# Patient Record
Sex: Female | Born: 1935 | Race: White | Hispanic: No | Marital: Married | Smoking: Former smoker
Health system: Southern US, Community
[De-identification: ages and names within clinical notes are randomized; demographics above are authoritative.]

## PROBLEM LIST (undated history)

## (undated) DIAGNOSIS — C4490 Unspecified malignant neoplasm of skin, unspecified: Secondary | ICD-10-CM

## (undated) DIAGNOSIS — F32A Depression, unspecified: Secondary | ICD-10-CM

## (undated) DIAGNOSIS — E213 Hyperparathyroidism, unspecified: Secondary | ICD-10-CM

## (undated) DIAGNOSIS — M1990 Unspecified osteoarthritis, unspecified site: Secondary | ICD-10-CM

## (undated) DIAGNOSIS — M8580 Other specified disorders of bone density and structure, unspecified site: Secondary | ICD-10-CM

## (undated) DIAGNOSIS — E785 Hyperlipidemia, unspecified: Secondary | ICD-10-CM

## (undated) DIAGNOSIS — J449 Chronic obstructive pulmonary disease, unspecified: Secondary | ICD-10-CM

## (undated) DIAGNOSIS — E559 Vitamin D deficiency, unspecified: Secondary | ICD-10-CM

## (undated) DIAGNOSIS — R7301 Impaired fasting glucose: Secondary | ICD-10-CM

## (undated) DIAGNOSIS — F329 Major depressive disorder, single episode, unspecified: Secondary | ICD-10-CM

## (undated) DIAGNOSIS — K8020 Calculus of gallbladder without cholecystitis without obstruction: Secondary | ICD-10-CM

## (undated) DIAGNOSIS — K219 Gastro-esophageal reflux disease without esophagitis: Secondary | ICD-10-CM

## (undated) DIAGNOSIS — D179 Benign lipomatous neoplasm, unspecified: Secondary | ICD-10-CM

## (undated) DIAGNOSIS — K5790 Diverticulosis of intestine, part unspecified, without perforation or abscess without bleeding: Secondary | ICD-10-CM

## (undated) DIAGNOSIS — R1900 Intra-abdominal and pelvic swelling, mass and lump, unspecified site: Secondary | ICD-10-CM

## (undated) DIAGNOSIS — E039 Hypothyroidism, unspecified: Secondary | ICD-10-CM

## (undated) DIAGNOSIS — F419 Anxiety disorder, unspecified: Secondary | ICD-10-CM

## (undated) DIAGNOSIS — K5792 Diverticulitis of intestine, part unspecified, without perforation or abscess without bleeding: Secondary | ICD-10-CM

## (undated) HISTORY — DX: Major depressive disorder, single episode, unspecified: F32.9

## (undated) HISTORY — DX: Other specified disorders of bone density and structure, unspecified site: M85.80

## (undated) HISTORY — PX: OTHER SURGICAL HISTORY: SHX169

## (undated) HISTORY — DX: Hypothyroidism, unspecified: E03.9

## (undated) HISTORY — DX: Depression, unspecified: F32.A

## (undated) HISTORY — DX: Unspecified malignant neoplasm of skin, unspecified: C44.90

## (undated) HISTORY — DX: Hyperparathyroidism, unspecified: E21.3

## (undated) HISTORY — DX: Diverticulosis of intestine, part unspecified, without perforation or abscess without bleeding: K57.90

## (undated) HISTORY — DX: Impaired fasting glucose: R73.01

## (undated) HISTORY — DX: Calculus of gallbladder without cholecystitis without obstruction: K80.20

## (undated) HISTORY — DX: Unspecified osteoarthritis, unspecified site: M19.90

## (undated) HISTORY — DX: Vitamin D deficiency, unspecified: E55.9

## (undated) HISTORY — DX: Intra-abdominal and pelvic swelling, mass and lump, unspecified site: R19.00

## (undated) HISTORY — DX: Hyperlipidemia, unspecified: E78.5

## (undated) HISTORY — DX: Benign lipomatous neoplasm, unspecified: D17.9

## (undated) HISTORY — PX: COLON RESECTION: SHX5231

## (undated) HISTORY — PX: TONSILLECTOMY AND ADENOIDECTOMY: SUR1326

## (undated) HISTORY — DX: Diverticulitis of intestine, part unspecified, without perforation or abscess without bleeding: K57.92

## (undated) HISTORY — DX: Gastro-esophageal reflux disease without esophagitis: K21.9

---

## 1998-09-24 ENCOUNTER — Other Ambulatory Visit: Admission: RE | Admit: 1998-09-24 | Discharge: 1998-09-24 | Payer: Self-pay | Admitting: Obstetrics and Gynecology

## 1998-11-14 ENCOUNTER — Encounter: Payer: Self-pay | Admitting: General Surgery

## 1998-11-14 ENCOUNTER — Ambulatory Visit (HOSPITAL_COMMUNITY): Admission: RE | Admit: 1998-11-14 | Discharge: 1998-11-14 | Payer: Self-pay | Admitting: General Surgery

## 1999-10-27 ENCOUNTER — Other Ambulatory Visit: Admission: RE | Admit: 1999-10-27 | Discharge: 1999-10-27 | Payer: Self-pay | Admitting: Obstetrics and Gynecology

## 2000-11-08 ENCOUNTER — Other Ambulatory Visit: Admission: RE | Admit: 2000-11-08 | Discharge: 2000-11-08 | Payer: Self-pay | Admitting: Obstetrics and Gynecology

## 2001-09-11 ENCOUNTER — Encounter: Payer: Self-pay | Admitting: Orthopedic Surgery

## 2001-09-18 ENCOUNTER — Inpatient Hospital Stay (HOSPITAL_COMMUNITY): Admission: RE | Admit: 2001-09-18 | Discharge: 2001-09-21 | Payer: Self-pay | Admitting: Orthopedic Surgery

## 2001-09-18 ENCOUNTER — Encounter: Payer: Self-pay | Admitting: Orthopedic Surgery

## 2001-11-09 ENCOUNTER — Other Ambulatory Visit: Admission: RE | Admit: 2001-11-09 | Discharge: 2001-11-09 | Payer: Self-pay | Admitting: Obstetrics and Gynecology

## 2002-08-02 HISTORY — PX: TOTAL HIP ARTHROPLASTY: SHX124

## 2004-01-30 ENCOUNTER — Ambulatory Visit (HOSPITAL_COMMUNITY): Admission: RE | Admit: 2004-01-30 | Discharge: 2004-01-30 | Payer: Self-pay | Admitting: Internal Medicine

## 2004-03-20 ENCOUNTER — Emergency Department (HOSPITAL_COMMUNITY): Admission: EM | Admit: 2004-03-20 | Discharge: 2004-03-20 | Payer: Self-pay | Admitting: Emergency Medicine

## 2004-04-22 ENCOUNTER — Ambulatory Visit (HOSPITAL_COMMUNITY): Admission: RE | Admit: 2004-04-22 | Discharge: 2004-04-22 | Payer: Self-pay | Admitting: Internal Medicine

## 2004-06-02 ENCOUNTER — Encounter (INDEPENDENT_AMBULATORY_CARE_PROVIDER_SITE_OTHER): Payer: Self-pay | Admitting: *Deleted

## 2004-06-02 ENCOUNTER — Inpatient Hospital Stay (HOSPITAL_COMMUNITY): Admission: RE | Admit: 2004-06-02 | Discharge: 2004-06-06 | Payer: Self-pay | Admitting: Surgery

## 2007-12-28 ENCOUNTER — Encounter: Admission: RE | Admit: 2007-12-28 | Discharge: 2007-12-28 | Payer: Self-pay | Admitting: Surgery

## 2008-08-02 HISTORY — PX: COLON RESECTION: SHX5231

## 2009-01-30 ENCOUNTER — Ambulatory Visit (HOSPITAL_COMMUNITY): Admission: RE | Admit: 2009-01-30 | Discharge: 2009-01-30 | Payer: Self-pay | Admitting: Surgery

## 2010-08-23 ENCOUNTER — Encounter: Payer: Self-pay | Admitting: Surgery

## 2010-08-23 ENCOUNTER — Encounter: Payer: Self-pay | Admitting: Internal Medicine

## 2010-12-18 NOTE — H&P (Signed)
[facility]  Patient:    Janet Mccarthy, Janet Mccarthy Visit Number: [id number] MRN:           Service Type: Dictated by:   Dorie Rank, P.A. Adm. Date:  09/18/01                         History and Physical  ATTENDING NOT GIVEN  DATE OF BIRTH:  11-30-1934  CHIEF COMPLAINT:  Right hip pain.  HISTORY OF PRESENT ILLNESS:  The patient is a pleasant 76 year old female who has had a long history of right hip pain for several years now and has been refractory to conservative treatment, to the point where it is interfering with her ADLs.  She had radiographs in the office, which demonstrated significant bone-on-bone degenerative changes with a large inferior osteophyte on the right side.  She also had some significant degeneration in the femoral head.  It was thought that she would benefit from undergoing a right total hip arthroplasty.  Risks and benefits, as well as the procedure, were discussed with the patient.  She agreed to proceed.  MEDICATIONS: 1. Amitriptyline 50 mg 1 p.o. q.h.s. 2. Methyclothiazide 5 mg 1 p.o. q.o.d.  ALLERGIES:  SULFA causes nausea.  PAST MEDICAL HISTORY:  She has had lipomas for about 40 years.  She has situational anxiety.  It is of note on her preoperative evaluation by Dr. Waynard Edwards she did have some EKG changes.  SOCIAL HISTORY:  The patient plans for home health PT.  She lives in a one-level home.  She has two steps up into the house.  She denies any tobacco or alcohol use.  She would like a private room.  FAMILY HISTORY: Noncontributory.  The patient is adopted.  REVIEW OF SYSTEMS:  GENERAL:  No fevers, chills, night sweats, or bleeding tendencies.  CARDIOVASCULAR:  Occasional dyspnea on exertion.  No chest pain, angina, or orthopnea.  PULMONARY:  No shortness of breath, productive cough, or hemoptysis.  GI:  No nausea, vomiting, diarrhea, constipation, melena, or bloody stools.  NEUROLOGIC:  No seizures, headaches, or  paralysis.  GU:  No hematuria, dysuria, or discharge.  PHYSICAL EXAMINATION:  GENERAL:  Alert and oriented 76 year old female, well-developed, well-nourished.  HEENT:  Head is atraumatic, normocephalic.  Oropharynx is clear.  NECK:  Supple.  Negative for carotid bruits bilaterally.  She has some soft, palpable lipomas to the right sternocleidomastoid area and posterior neck. These are soft, mobile, and nontender.  CHEST:  Lungs are clear to auscultation bilaterally.  No wheezes, rhonchi, or rales.  BREASTS:  Not pertinent to present illness.  ADENOPATHY:  No cervical lymphadenopathy palpated on exam.  HEART:  S1, S2.  Negative for murmur, rub, or gallop.  Heart is regular in rate and rhythm.  ABDOMEN:  Soft, nontender.  Positive bowel sounds.  GENITOURINARY:  Not pertinent to present illness.  EXTREMITIES:  2+ dorsalis pedispulse bilaterally.  1+ posterior tibialis pulse bilaterally.  She has flexion to the right hip to about 75 degrees.  No internal rotation.  External rotation to about 10 degrees, 20 degrees of abduction.  She has pain with movement.  She walks with an antalgic gait.  SKIN:  Intact.  Nolesions or rashes appreciated on exam.  LABORATORY DATA:  Preop labs and x-rays are pending at this time.  IMPRESSION: 1. Osteoarthritis to the right hip. 2. EKG changes.  The patient underwent a Cardiolite and her cardiac clearance    is currently  pending.  PLAN:  A right total hip arthroplasty pending cardiac clearance by Dr. Donnie Aho and Dr. Waynard Edwards. Dictated by:   Dorie Rank, P.A. DD:  09/12/01 TD:  09/12/01 Job: [id number]

## 2010-12-18 NOTE — Discharge Summary (Signed)
Janet Mccarthy, Janet Mccarthy           ACCOUNT NO.:  [id number]   MEDICAL RECORD NO.:  [id number]          PATIENT TYPE:  INP   LOCATION:  0448                         FACILITY:  [facility]   PHYSICIAN:  Velora Heckler, MD      DATE OF BIRTH:  [birth year]   DATE OF ADMISSION:  06/02/2004  DATE OF DISCHARGE:  06/06/2004                                 DISCHARGE SUMMARY   REASON FOR ADMISSION:  Retroperitoneal mass, sigmoid diverticular disease.   HISTORY OF PRESENT ILLNESS:  The patient is a pleasant 76 year old white  female with a complex recent medical history. She was diagnosed with  diverticular disease of the colon in April of 2005. CT scanning revealed  sigmoid diverticular disease. I have also demonstrated a retroperitoneal  mass at the level of the aortic bifurcation. This measured 5.6 cm in  greatest dimension. The patient was seen in consultation by Dr. Hillary Bow at [facility]. She returned to [place].  She underwent colonoscopy by Dr. Yancey Flemings. She was then prepared and is  now brought to the operating room for abdominal exploration for sigmoid  colectomy for treatment of diverticular disease and resection of  retroperitoneal mass.   HOSPITAL COURSE:  The patient was admitted June 02, 2004. She underwent  exploratory laparotomy. She underwent sigmoid colectomy with resection of  retroperitoneal mass. Final pathology revealed diverticulosis with  diverticulitis and underlying abscess formation. There was no sign of  malignancy. Retroperitoneal mass was felt to represent a cellular  Schwannoma. Case was sent for outside consultation to Dr. Marcene Brawn at [facility] in [place]. They felt that nothing  present within the tumor was worrisome for malignancy, and local recurrence  was felt to be unlikely.   Postoperatively, the patient did well. She had gradual resolution of her  ileus. She remained hemodynamically stable.  She began sips of clear liquids  on the second postoperative day. She advanced to a clear liquid diet on the  third postoperative day. The patient tolerated a regular diet and was  discharged on the fourth postoperative day.   DISCHARGE PLAN:  The patient was discharged on June 06, 2004 in good  condition, tolerating a regular diet, and ambulating independently. The  patient will take Vicodin as needed for pain as well as her other  medications as per usual. She will be seen back in my office at [facility] in 3 weeks.   FINAL DIAGNOSES:  1.  Sigmoid diverticular disease, diverticulitis, contained abscess      formation.  2.  Cellular Schwannoma, 5 cm.   CONDITION ON DISCHARGE:  Good.     Todd   TMG/MEDQ  D:  06/16/2004  T:  06/16/2004  Job:  [id number]   cc:   Loraine Leriche A. Waynard Edwards, M.D.  [address]  [place]  [place] [address]  Fax: [phone]   Wilhemina Bonito. Marina Goodell, M.D. [facility]   Hillary Bow, M.D.

## 2010-12-18 NOTE — Op Note (Signed)
[name], Mccarthy           ACCOUNT NO.:  [id number]   MEDICAL RECORD NO.:  [id number]          PATIENT TYPE:  INP   LOCATION:  0004                         FACILITY:  [facility]   PHYSICIAN:  Velora Heckler, MD      DATE OF BIRTH:  July 21, 1935   DATE OF PROCEDURE:  06/02/2004  DATE OF DISCHARGE:                                 OPERATIVE REPORT   PREOPERATIVE DIAGNOSES:  1.  Sigmoid diverticular disease.  2.  Retroperitoneal mass (5 x 3 x 3 cm).   POSTOPERATIVE DIAGNOSES:  1.  Sigmoid diverticular disease.  2.  Retroperitoneal mass (5 x 3 x 3 cm).   PROCEDURES:  1.  Sigmoid colectomy.  2.  Resection retroperitoneal mass.   SURGEON:  Velora Heckler, M.D.   ASSISTANT:  Jerelene Redden, M.D.   ANESTHESIA:  General per Dr. Frutoso Chase.   ESTIMATED BLOOD LOSS:  150 mL.   PREPARATION:  Betadine.   COMPLICATIONS:  None.   INDICATIONS:  The patient is a 76 year old white female with a history of  diverticular disease dating until April 2005.  The patient was treated with  antibiotics.  She underwent CT scanning of the abdomen.  This demonstrated a  retroperitoneal mass measuring approximately 5 x 3 x 3 cm at the level of  the aortic bifurcation.  This was confirmed on MRI scan.  Work-up failed to  differentiate the type of tumor.  She was seen in consultation by Dr. Hillary Bow at [facility] in August 2005.  Dr.  Beverely Pace felt this was likely a benign process.  However, percutaneous  biopsy carried considerable risk.  At that time, they recommended  observation.  However, the patient continued to have problems with sigmoid  diverticulitis.  She was treated again with antibiotics.  She subsequently  underwent attempted colonoscopy by Dr. Yancey Flemings.  He was unable to pass  through the sigmoid colon.  Therefore, she underwent barium enema on  September 21.  This showed extravasation in the mid sigmoid colon in the  region of significant  diverticulosis.  There were at least two adjacent  filling defect representing polyps.  The patient returned to my practice for  consideration for sigmoid colectomy and concurrent either biopsy or  resection of the retroperitoneal mass.   DESCRIPTION OF PROCEDURE:  The procedure is done in OR #6 at the [facility].  The patient is brought to the operating room and placed  in a supine position on the operating room table.  Following the  administration of general anesthesia, the patient is prepped and draped in  the usual strict aseptic fashion.  After ascertaining that an adequate level  of anesthesia had been obtained, a midline abdominal incision is made with a  #10 blade.  Dissection is carried down through subcutaneous tissues.  Fascia  is incised in the midline, and the peritoneal cavity is entered cautiously.  Abdomen is explored.  There is significant diverticular disease extending  from the distal descending colon, throughout the sigmoid colon, and into the  pelvis.  The sigmoid colon is  densely adherent to the left adnexal  structures.  It is also adherent to the posterior wall of the uterus.  There  is dense inflammatory change present.  There is no acute abscess or purulent  drainage, however.  In the retroperitoneum, there is indeed a 5 cm mass at  the bifurcation of the aorta.  There is no other associated lymphadenopathy  palpable.  Omentum is normal without sign of tumor.  Remainder of the colon  is normal.  Appendix appears surgically absent.  Small bowel is run  throughout its length and appears grossly normal.  There is no  lymphadenopathy.  There is no Meckel's diverticulum.  There is no other sign  of small bowel pathology.  Stomach appears normal on palpation.  Liver is  normal without mass lesion.  There are a few small gallstones within the  gallbladder.   Bookwalter retractor is placed to maintain exposure.  Dissection is begun on  the lateral  aspect of the sigmoid colon and along its peritoneal reflection.  Sigmoid colon is completely mobilized down to the left adnexa.  Adnexal  structures are dissected off of the lateral aspect of the sigmoid colon.  Ureter is identified and preserved.  Hemostasis is obtained with 2-0 silk  suture ligatures and large yellow Ligaclips.  Sigmoid colon is completely  freed from the adnexa and then from the posterior wall of the uterus.  Distal sigmoid colon just above the pelvic peritoneal reflection is  transected with a GIA stapler.  Mesentery is divided between [facility] clamps  and ligated with 2-0 silk ties and 2-0 silk suture ligatures.  Point in the  distal descending colon is selected and is transected with the GIA stapler.  The remaining mesentery is taken down between [facility] clamps, divided, and  ligated with 2-0 silk ties and 2-0 silk suture ligatures.  Sigmoid colon is  completely excised and submitted to pathology.  Bowel was opened by the  pathologist and shows signs of diverticular disease.  No tumor or mass is  identified.  Good hemostasis is noted along the mesenteric resection line.   Next we turned our attention to the retroperitoneal mass.  Mass palpates  approximately 5 x 3 x 3 cm.  It is located at the bifurcation of the aorta.  Retroperitoneum is opened with the electrocautery.  Dissection is carried  down to the mass.  Mass has a grayish color and appears to be encapsulated.  Tissue is carefully dissected with the right-angle clamp around the mass.  Vascular and lymphatic tributaries are divided between large Ligaclips.  Using gently blunt dissection, the entire mass is mobilized from the  retroperitoneum and excised.  It is submitted to pathology for review.  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  There is  some atypia.  Final diagnosis will pin permanent sectioning.  Good  hemostasis is obtained in the retroperitoneum.  Surgicel was placed in  the bed of the tumor.  Retroperitoneum is closed with a running 2-0 Vicryl  suture.   Next the anastomosis is created between the distal descending colon and  distal sigmoid colon.  The descending colon is mobilized along its lateral  peritoneal attachments to provide for adequate length to reach into the  pelvis.  Staple lines from the proximal and distal end of the bowel are  excised with electrocautery, and 2-0 silk stay sutures are placed.  An end-  to-end anastomosis is then performed with interrupted 3-0 silk sutures  placed  circumferentially.  Stay sutures are removed.  All silk sutures are  tied securely.  Lumen is widely patent.  There does not appear to be any  tension on the anastomosis.  Good hemostasis is noted.  The abdomen is  copiously irrigated with warm saline.  All packs are removed.  Omentum is  used to cover the small bowel.  Midline wound is then closed with a running  #1  Novofil suture in the fascia.  Subcutaneous tissue is irrigated and  hemostasis obtained with the electrocautery.  Skin is closed with stainless  steel staples.  Sterile dressings are applied.  The patient is awakened from  anesthesia and brought to the recovery room in stable condition.  The  patient tolerated the procedure well.     Todd   TMG/MEDQ  D:  06/02/2004  T:  06/02/2004  Job:  [id number]   cc:   Loraine Leriche A. Waynard Edwards, M.D.  [address]  [place]  [place] [address]  Fax: [phone]   Wilhemina Bonito. Marina Goodell, M.D. [facility]   Hillary Bow, MD  [facility]  Dept. of Surgery

## 2010-12-18 NOTE — Discharge Summary (Signed)
[facility]  Patient:    Janet Mccarthy, Janet Mccarthy Visit Number: [id number] MRN: [id number]          Service Type: SUR Location: 4W 0464 01 Attending Physician:  Loanne Drilling Dictated by:   Ralene Bathe, P.A. Admit Date:  09/18/2001 Disc. Date: 09/21/01                             Discharge Summary  ADMISSION DIAGNOSES: 1. End-stage osteoarthritis, right hip. 2. Situational anxiety. 3. Mild preoperative electrocardiogram changes with a negative Cardiolite.  DISCHARGE DIAGNOSES: 1. End-stage osteoarthritis, right hip. 2. Situational anxiety. 3. Mild preoperative electrocardiogram changes with a negative Cardiolite. 4. Status post right hip arthroplasty. 5. Mild postoperative anemia. 6. Mild hyponatremia, resolved with fluid restriction.  PROCEDURE:  Right total hip arthroplasty, surgeon Dr. Ollen Gross, assistant Avel Peace, P.A.-C, under general anesthesia.  HISTORY OF PRESENT ILLNESS:  Ms. Janet Mccarthy is a 76 year old female who had end-stage osteoarthritis of the right hip.  She has failed conservative measures and had significant pain secondary to ADLs.  At this time, she wishes to proceed with right total hip arthroplasty.  The risks and benefits were discussed with the patient at length and she is in agreement and wishes to proceed.  HOSPITAL COURSE:  The patient was admitted and underwent the above-mentioned procedure and tolerated this well.  All appropriate IV antibiotics and analgesics were utilized.  Postoperatively, she was placed on Coumadin for DVT and PE prophylaxis.  She was placed on touchdown weightbearing and total hip precautions with total hip protocol.  She did extremely well postoperatively with therapy.  She did experience a mild hyponatremia that resolved with fluid restrictions and discontinuation of her IV.  She had a mild hemorrhagic anemia not requiring transfusion and she was asymptomatic.  All in all, by postop  day #3, she had met all therapy goals.  She had done stairs.  She was voiding without difficulty.  She was afebrile with vital signs stable.  Her hemoglobin was stable at 10.3.  Her incision was clean and dry and she was neurovascularly intact.  At this time, she was stable for discharge to home in the care of her husband.  LABORATORY DATA AND X-RAY FINDINGS:  Admission hemoglobin 13.8, postop hemoglobin 10.9 and 10.5.  PT and PTT, followed by pharmacy, were normal on admission.  She was placed on Coumadin for DVT and PE prophylaxis.  See chart for details.  Chemistries on admission showed a sodium of 141, postoperatively hyponatremic on February 18, at 131 with improvement to 136 on February 19.  Radiology showed status post right total hip arthroplasty with prosthesis in good position.  No evidence of fracture.  Chest x-ray preoperatively showed chronic obstructive pulmonary disease with no active findings.  EKG was normal sinus rhythm except for some flattening of T waves inferiorly.  She did have a stress adenosine Cardiolite that showed normal Cardiolite scan preoperatively.  CONDITION ON DISCHARGE:  Stable and improved.  DISPOSITION:  The patient is being discharged to home in the care of her husband.  FOLLOWUP:  She is to follow up two weeks postoperatively, call for time.  DISCHARGE MEDICATIONS: 1. Vicodin #30, one to two every four to six hours p.r.n. pain, one refill. 2. Robaxin 500 mg one every eight hours p.r.n. spasm. 3. Coumadin, per pharmacy for a total of three weeks.  ACTIVITY:  Total hip precautions, touchdown weightbearing with therapy.  Home health PT  and R.N. are arranged.  DIET:  Resume home diet. Dictated by:   Ralene Bathe, P.A. Attending Physician:  Loanne Drilling DD:  09/21/01 TD:  09/21/01 Job: [id number]

## 2010-12-18 NOTE — Op Note (Signed)
[facility]  Patient:    MCCARTNEY, BRUCKS Visit Number: [id number] MRN: [id number]          Service Type: Attending:  Ollen Gross, M.D. Proc. Date: 09/18/01                             Operative Report  PREOPERATIVE DIAGNOSIS:  Osteoarthritis right hip.  POSTOPERATIVE DIAGNOSIS:  Osteoarthritis right hip.  PROCEDURE PERFORMED:  Right total hip arthroplasty.  SURGEON:  Ollen Gross, M.D.  ASSISTANT:  Alexzandrew L. Perkins, P.A.-C.  ANESTHESIA:  General.  ESTIMATED BLOOD LOSS:  400 cc.  DRAIN:  Hemovac x1.  COMPLICATIONS:  None.  CONDITION:  Stable to recovery.  INDICATION:  Mrs. Poma is a 76 year old female with severe osteoarthritis of her right hip with pain refractory to nonoperative management.  She presents now for a right total hip arthroplasty.  DESCRIPTION OF PROCEDURE:  After the successful administration of a general anesthetic, the patient was placed in the left lateral decubitus position with her right side up and held with the hip positioner.  Her right lower extremity was isolated from her peritoneal plastic drapes, prepped, and draped in the usual sterile fashion.  A standard posterolateral incision was made with the 10 blade through the subcutaneous tissues to the level of the fascia lata which was incised in line with the skin incision.  The sciatic nerve was palpated and protected, and the short external rotators isolated off the femur.  A capsulectomy is then performed.  The hip is dislocated and the center of the femoral head marked.  Trial prosthesis is placed at the center of the trial head and corresponds to the center of the patients native femoral head.  An osteotomy is then made with an oscillating saw.  The femur was subsequently retracted anteriorly and the anterior capsule removed. Acetabular exposure was obtained.  Acetabular reaming begins with a 47, increments to a 53, and then a 54 mm pinnicle  acetabular shell was impacted in an anatomic position.  It is transfixed with two dome screws with excellent purchase.  A trial 28 mm neutral liner is then placed.  The femur is then prepared.  The canal is found with the canal finder, and then the canal was irrigated.  It was then broached with the endurance broaches up to a size 3.  The high offset neck is placed with a 28 plus 1.5 head.  The hip is reduced with great stability.  Full extension and full external rotation, 70 degrees flexion and 40 degrees abduction, 90 degrees internal rotation and 90 degrees flexion, and 90 degrees internal rotation. The hip is then dislocated.  All trials are removed.  The permanent apex hold illuminator is placed into the acetabular shell and a permanent 28 mm neutral marathon liner placed into the shell.  A sponge is placed into the acetabulum to prevent cement from coming in.  The cement restrictor is sized, and a size 4 is most appropriate.  A size 4 restrictor is placed at the appropriate depth in the femoral canal.  The canal was then irrigated with pulsatile lavage and cement mixed.  Once ready for implantation, the cement is injected and pressurized into the canal and the endurance luster high offset stem size 3 is placed matching the patient native anteversion.  Once the cement is fully hardened, then the sponge is removed from the acetabulum.  The permanent 28 plus 1.5 head placed onto the  neck and then the hip reduced.  She again had the same stability parameters.  The wound was copiously irrigated with antibiotic solution and short external rotators reattached to the femur through drill holes.  The fascia lata is closed over a Hemovac drain with interrupted #1 Vicryl.  The subcu closed with #1 and 2-0 Vicryl and a subcuticular running 4-0 Monocryl.  The incision is cleaned and dried. Steri-Strips and a bulky sterile dressing applied.  The patient is awakened and transported to recovery in  stable condition. Attending:  Ollen Gross, M.D. DD:  09/18/01 TD:  09/18/01 Job: [id number]

## 2011-08-06 ENCOUNTER — Encounter (INDEPENDENT_AMBULATORY_CARE_PROVIDER_SITE_OTHER): Payer: Self-pay

## 2011-09-17 DIAGNOSIS — Z1231 Encounter for screening mammogram for malignant neoplasm of breast: Secondary | ICD-10-CM | POA: Diagnosis not present

## 2011-09-17 DIAGNOSIS — N899 Noninflammatory disorder of vagina, unspecified: Secondary | ICD-10-CM | POA: Diagnosis not present

## 2011-09-17 DIAGNOSIS — Z124 Encounter for screening for malignant neoplasm of cervix: Secondary | ICD-10-CM | POA: Diagnosis not present

## 2011-09-17 DIAGNOSIS — K644 Residual hemorrhoidal skin tags: Secondary | ICD-10-CM | POA: Diagnosis not present

## 2011-09-17 DIAGNOSIS — Z01419 Encounter for gynecological examination (general) (routine) without abnormal findings: Secondary | ICD-10-CM | POA: Diagnosis not present

## 2011-11-15 DIAGNOSIS — H259 Unspecified age-related cataract: Secondary | ICD-10-CM | POA: Diagnosis not present

## 2011-11-15 DIAGNOSIS — H04129 Dry eye syndrome of unspecified lacrimal gland: Secondary | ICD-10-CM | POA: Diagnosis not present

## 2011-11-15 DIAGNOSIS — H5200 Hypermetropia, unspecified eye: Secondary | ICD-10-CM | POA: Diagnosis not present

## 2011-11-15 DIAGNOSIS — H43819 Vitreous degeneration, unspecified eye: Secondary | ICD-10-CM | POA: Diagnosis not present

## 2012-01-06 DIAGNOSIS — E785 Hyperlipidemia, unspecified: Secondary | ICD-10-CM | POA: Diagnosis not present

## 2012-01-06 DIAGNOSIS — E559 Vitamin D deficiency, unspecified: Secondary | ICD-10-CM | POA: Diagnosis not present

## 2012-01-06 DIAGNOSIS — R82998 Other abnormal findings in urine: Secondary | ICD-10-CM | POA: Diagnosis not present

## 2012-01-06 DIAGNOSIS — M899 Disorder of bone, unspecified: Secondary | ICD-10-CM | POA: Diagnosis not present

## 2012-01-06 DIAGNOSIS — E039 Hypothyroidism, unspecified: Secondary | ICD-10-CM | POA: Diagnosis not present

## 2012-01-06 DIAGNOSIS — I10 Essential (primary) hypertension: Secondary | ICD-10-CM | POA: Diagnosis not present

## 2012-01-06 DIAGNOSIS — R7301 Impaired fasting glucose: Secondary | ICD-10-CM | POA: Diagnosis not present

## 2012-01-07 DIAGNOSIS — Z0000 Encounter for general adult medical examination without abnormal findings: Secondary | ICD-10-CM | POA: Diagnosis not present

## 2012-01-07 DIAGNOSIS — I10 Essential (primary) hypertension: Secondary | ICD-10-CM | POA: Diagnosis not present

## 2012-01-07 DIAGNOSIS — E785 Hyperlipidemia, unspecified: Secondary | ICD-10-CM | POA: Diagnosis not present

## 2012-01-13 DIAGNOSIS — Z0000 Encounter for general adult medical examination without abnormal findings: Secondary | ICD-10-CM | POA: Diagnosis not present

## 2012-01-13 DIAGNOSIS — R7301 Impaired fasting glucose: Secondary | ICD-10-CM | POA: Diagnosis not present

## 2012-01-13 DIAGNOSIS — E785 Hyperlipidemia, unspecified: Secondary | ICD-10-CM | POA: Diagnosis not present

## 2012-01-13 DIAGNOSIS — F39 Unspecified mood [affective] disorder: Secondary | ICD-10-CM | POA: Diagnosis not present

## 2012-01-18 DIAGNOSIS — Z1212 Encounter for screening for malignant neoplasm of rectum: Secondary | ICD-10-CM | POA: Diagnosis not present

## 2012-07-10 DIAGNOSIS — R7301 Impaired fasting glucose: Secondary | ICD-10-CM | POA: Diagnosis not present

## 2012-07-10 DIAGNOSIS — E210 Primary hyperparathyroidism: Secondary | ICD-10-CM | POA: Diagnosis not present

## 2012-07-10 DIAGNOSIS — E039 Hypothyroidism, unspecified: Secondary | ICD-10-CM | POA: Diagnosis not present

## 2012-07-10 DIAGNOSIS — Z79899 Other long term (current) drug therapy: Secondary | ICD-10-CM | POA: Diagnosis not present

## 2012-07-10 DIAGNOSIS — I10 Essential (primary) hypertension: Secondary | ICD-10-CM | POA: Diagnosis not present

## 2012-07-10 DIAGNOSIS — E559 Vitamin D deficiency, unspecified: Secondary | ICD-10-CM | POA: Diagnosis not present

## 2012-07-31 DIAGNOSIS — E210 Primary hyperparathyroidism: Secondary | ICD-10-CM | POA: Diagnosis not present

## 2012-07-31 DIAGNOSIS — I10 Essential (primary) hypertension: Secondary | ICD-10-CM | POA: Diagnosis not present

## 2012-07-31 DIAGNOSIS — R748 Abnormal levels of other serum enzymes: Secondary | ICD-10-CM | POA: Diagnosis not present

## 2012-07-31 DIAGNOSIS — Z79899 Other long term (current) drug therapy: Secondary | ICD-10-CM | POA: Diagnosis not present

## 2012-07-31 DIAGNOSIS — H8110 Benign paroxysmal vertigo, unspecified ear: Secondary | ICD-10-CM | POA: Diagnosis not present

## 2012-08-03 DIAGNOSIS — R748 Abnormal levels of other serum enzymes: Secondary | ICD-10-CM | POA: Diagnosis not present

## 2012-08-22 DIAGNOSIS — K7689 Other specified diseases of liver: Secondary | ICD-10-CM | POA: Diagnosis not present

## 2012-08-22 DIAGNOSIS — R945 Abnormal results of liver function studies: Secondary | ICD-10-CM | POA: Diagnosis not present

## 2012-08-22 DIAGNOSIS — R7989 Other specified abnormal findings of blood chemistry: Secondary | ICD-10-CM | POA: Diagnosis not present

## 2012-08-28 ENCOUNTER — Other Ambulatory Visit: Payer: Self-pay | Admitting: Internal Medicine

## 2012-08-28 DIAGNOSIS — R748 Abnormal levels of other serum enzymes: Secondary | ICD-10-CM | POA: Diagnosis not present

## 2012-08-28 DIAGNOSIS — N281 Cyst of kidney, acquired: Secondary | ICD-10-CM

## 2012-08-28 DIAGNOSIS — I10 Essential (primary) hypertension: Secondary | ICD-10-CM | POA: Diagnosis not present

## 2012-08-28 DIAGNOSIS — H8110 Benign paroxysmal vertigo, unspecified ear: Secondary | ICD-10-CM | POA: Diagnosis not present

## 2012-09-04 ENCOUNTER — Encounter: Payer: Self-pay | Admitting: Internal Medicine

## 2012-09-19 DIAGNOSIS — Z01419 Encounter for gynecological examination (general) (routine) without abnormal findings: Secondary | ICD-10-CM | POA: Diagnosis not present

## 2012-09-19 DIAGNOSIS — Z1231 Encounter for screening mammogram for malignant neoplasm of breast: Secondary | ICD-10-CM | POA: Diagnosis not present

## 2012-09-19 DIAGNOSIS — Z124 Encounter for screening for malignant neoplasm of cervix: Secondary | ICD-10-CM | POA: Diagnosis not present

## 2012-09-19 DIAGNOSIS — L940 Localized scleroderma [morphea]: Secondary | ICD-10-CM | POA: Diagnosis not present

## 2012-09-21 DIAGNOSIS — N6489 Other specified disorders of breast: Secondary | ICD-10-CM | POA: Diagnosis not present

## 2012-10-03 ENCOUNTER — Ambulatory Visit: Payer: Self-pay | Admitting: Internal Medicine

## 2012-10-11 ENCOUNTER — Ambulatory Visit
Admission: RE | Admit: 2012-10-11 | Discharge: 2012-10-11 | Disposition: A | Discharge status: Home / Self Care | Payer: Medicare Other | Source: Ambulatory Visit | Attending: Internal Medicine | Admitting: Internal Medicine

## 2012-10-11 DIAGNOSIS — N281 Cyst of kidney, acquired: Secondary | ICD-10-CM

## 2012-10-31 ENCOUNTER — Encounter: Payer: Self-pay | Admitting: Internal Medicine

## 2012-10-31 ENCOUNTER — Other Ambulatory Visit (INDEPENDENT_AMBULATORY_CARE_PROVIDER_SITE_OTHER): Payer: Medicare Other

## 2012-10-31 ENCOUNTER — Ambulatory Visit (INDEPENDENT_AMBULATORY_CARE_PROVIDER_SITE_OTHER): Payer: Medicare Other | Admitting: Internal Medicine

## 2012-10-31 VITALS — BP 154/72 | HR 84 | Ht 67.0 in | Wt 193.0 lb

## 2012-10-31 DIAGNOSIS — R7402 Elevation of levels of lactic acid dehydrogenase (LDH): Secondary | ICD-10-CM

## 2012-10-31 DIAGNOSIS — R933 Abnormal findings on diagnostic imaging of other parts of digestive tract: Secondary | ICD-10-CM

## 2012-10-31 LAB — IGA: IgA: 323 mg/dL (ref 68–378)

## 2012-10-31 NOTE — Patient Instructions (Addendum)
  Your physician has requested that you go to the basement for the following lab work before leaving today:  TTG and IGA   You have been scheduled for an MRI and MRCP at [facility] in radiology on the first floor on 11-02-12 Your appointment time is 8:00am. Please arrive 15 minutes prior to your appointment time for registration purposes.  Do not eat or drink anything after midnight.  If you have any metal in your body, have a pacemaker or defibrillator, please be sure to let your ordering physician know. This test typically takes 45 minutes to 1 hour to complete.  If you need to reschedule, the phone number is [phone]

## 2012-10-31 NOTE — Progress Notes (Signed)
HISTORY OF PRESENT ILLNESS:  Janet Mccarthy is a 78 y.o. female who I have seen remotely for complicated diverticular disease leading to surgery in 2005. She is sent today regarding abnormal liver test. She has multiple medical problems as listed below including hyperparathyroidism and osteoporosis. Patient states that she was not known to have abnormal liver tests until this fall. Review of outside records shows blood work from 07/10/2012 with AST 38, ALT 71 (40), alkaline phosphatase 229 (137 upper limit of normal), and total bilirubin 0.5. Normal thyroid testing and hemoglobin A1c as well as vitamin B12 level. Calcium 10.6 with PTH 68 (65). Followup liver test from December 30 revealed ALT 41 and alkaline phosphatase 153 testing in early January with alkaline phosphatase 149 (117) with 60% liver, 37% bone, 3% intestinal fraction. Most recently ALT 79 and alkaline phosphatase 226 on 08/28/2012. Additional testing including hepatitis serologies, antinuclear antibody, antimitochondrial antibody, ceruloplasmin were normal or equivocal. Patient denies personal history of liver disease. No new medications. She is adopted. Abdominal ultrasound obtained 08/22/2012 by [facility] was said to show possible fatty infiltration of the liver and possible gallstones. The common duct was said to be prominent at 1.1 cm. The patient's GI review of systems is unremarkable. No abdominal pain or weight loss. Actually 5 pound weight gain over the past few years. She is accompanied today by her husband REVIEW OF SYSTEMS:  All non-GI ROS negative except for anxiety and heart murmur  Past Medical History  Diagnosis Date  . Diverticular disease     sigmoid  . Diverticulitis   . Retroperitoneal mass   . Diverticulosis   . Hyperlipidemia   . IFG (impaired fasting glucose)   . Osteopenia   . Hyperparathyroidism   . Osteoarthritis   . Hypothyroidism   . Vitamin D deficiency   . Depression   . Arthritis      right hip  . Multiple lipomas   . Skin cancer     nose  . Gallstones     Past Surgical History  Procedure Laterality Date  . Tonsillectomy and adenoidectomy    . Total hip arthroplasty  2004    right  . Colon resection    . Tumor removal      benign retriperitoneal    Social History Janet Mccarthy  reports that she quit smoking about 41 years ago. She does not have any smokeless tobacco history on file. She reports that she does not drink alcohol or use illicit drugs.  family history is not on file. She is adopted.  Allergies  Allergen Reactions  . Sulfa Antibiotics        PHYSICAL EXAMINATION: Vital signs: BP 154/72  Pulse 84  Ht 5\' 7"  (1.702 m)  Wt 193 lb (87.544 kg)  BMI 30.22 kg/m2  Constitutional: generally well-appearing, no acute distress Psychiatric: alert and oriented x3, cooperative Eyes: extraocular movements intact, anicteric, conjunctiva pink Mouth: oral pharynx moist, no lesions Neck: supple no lymphadenopathy Cardiovascular: heart regular rate and rhythm, no murmur Lungs: clear to auscultation bilaterally Abdomen: soft, obese, nontender, nondistended, no obvious ascites, no peritoneal signs, normal bowel sounds, no organomegaly Extremities: no lower extremity edema bilaterally Skin: no lesions on visible extremities Neuro: No focal deficits. No asterixis.    ASSESSMENT:  #1. Mild elevation of ALT and alkaline phosphatase. Negative preliminary workup. Possibly fatty liver. Celiac disease occasional cause for elevated liver tests of uncertain etiology. Will evaluate. Rule out obstructive process, particularly given ultrasound showing prominence of  the common bile duct.   PLAN:  #1. Tissue transglutaminase antibody. #2. Schedule MRI of the liver and MRCP #3. Further plans to be determined thereafter

## 2012-11-02 ENCOUNTER — Ambulatory Visit (HOSPITAL_COMMUNITY)
Admission: RE | Admit: 2012-11-02 | Discharge: 2012-11-02 | Disposition: A | Discharge status: Home / Self Care | Payer: Medicare Other | Source: Ambulatory Visit | Attending: Internal Medicine | Admitting: Internal Medicine

## 2012-11-02 ENCOUNTER — Other Ambulatory Visit: Payer: Self-pay | Admitting: Internal Medicine

## 2012-11-02 DIAGNOSIS — N281 Cyst of kidney, acquired: Secondary | ICD-10-CM | POA: Diagnosis not present

## 2012-11-02 DIAGNOSIS — R933 Abnormal findings on diagnostic imaging of other parts of digestive tract: Secondary | ICD-10-CM

## 2012-11-02 DIAGNOSIS — K8020 Calculus of gallbladder without cholecystitis without obstruction: Secondary | ICD-10-CM | POA: Diagnosis not present

## 2012-11-02 DIAGNOSIS — K449 Diaphragmatic hernia without obstruction or gangrene: Secondary | ICD-10-CM | POA: Diagnosis not present

## 2012-11-02 DIAGNOSIS — R748 Abnormal levels of other serum enzymes: Secondary | ICD-10-CM | POA: Diagnosis not present

## 2012-11-02 DIAGNOSIS — Q619 Cystic kidney disease, unspecified: Secondary | ICD-10-CM | POA: Diagnosis not present

## 2012-11-02 MED ORDER — GADOBENATE DIMEGLUMINE 529 MG/ML IV SOLN
18.0000 mL | Freq: Once | INTRAVENOUS | Status: AC | PRN
  Administered 2012-11-02: 18 mL via INTRAVENOUS

## 2012-11-27 DIAGNOSIS — H43819 Vitreous degeneration, unspecified eye: Secondary | ICD-10-CM | POA: Diagnosis not present

## 2012-11-27 DIAGNOSIS — H259 Unspecified age-related cataract: Secondary | ICD-10-CM | POA: Diagnosis not present

## 2012-11-27 DIAGNOSIS — H04129 Dry eye syndrome of unspecified lacrimal gland: Secondary | ICD-10-CM | POA: Diagnosis not present

## 2013-03-09 DIAGNOSIS — I10 Essential (primary) hypertension: Secondary | ICD-10-CM | POA: Diagnosis not present

## 2013-03-09 DIAGNOSIS — E039 Hypothyroidism, unspecified: Secondary | ICD-10-CM | POA: Diagnosis not present

## 2013-03-09 DIAGNOSIS — E785 Hyperlipidemia, unspecified: Secondary | ICD-10-CM | POA: Diagnosis not present

## 2013-03-09 DIAGNOSIS — E559 Vitamin D deficiency, unspecified: Secondary | ICD-10-CM | POA: Diagnosis not present

## 2013-03-09 DIAGNOSIS — R82998 Other abnormal findings in urine: Secondary | ICD-10-CM | POA: Diagnosis not present

## 2013-03-09 DIAGNOSIS — R7301 Impaired fasting glucose: Secondary | ICD-10-CM | POA: Diagnosis not present

## 2013-03-19 DIAGNOSIS — Z1331 Encounter for screening for depression: Secondary | ICD-10-CM | POA: Diagnosis not present

## 2013-03-19 DIAGNOSIS — R748 Abnormal levels of other serum enzymes: Secondary | ICD-10-CM | POA: Diagnosis not present

## 2013-03-19 DIAGNOSIS — N281 Cyst of kidney, acquired: Secondary | ICD-10-CM | POA: Diagnosis not present

## 2013-03-19 DIAGNOSIS — I10 Essential (primary) hypertension: Secondary | ICD-10-CM | POA: Diagnosis not present

## 2013-03-19 DIAGNOSIS — K754 Autoimmune hepatitis: Secondary | ICD-10-CM | POA: Diagnosis not present

## 2013-03-19 DIAGNOSIS — Z6830 Body mass index (BMI) 30.0-30.9, adult: Secondary | ICD-10-CM | POA: Diagnosis not present

## 2013-03-19 DIAGNOSIS — Z1212 Encounter for screening for malignant neoplasm of rectum: Secondary | ICD-10-CM | POA: Diagnosis not present

## 2013-03-19 DIAGNOSIS — R609 Edema, unspecified: Secondary | ICD-10-CM | POA: Diagnosis not present

## 2013-03-19 DIAGNOSIS — Z0000 Encounter for general adult medical examination without abnormal findings: Secondary | ICD-10-CM | POA: Diagnosis not present

## 2013-03-19 DIAGNOSIS — E210 Primary hyperparathyroidism: Secondary | ICD-10-CM | POA: Diagnosis not present

## 2013-03-19 DIAGNOSIS — H8110 Benign paroxysmal vertigo, unspecified ear: Secondary | ICD-10-CM | POA: Diagnosis not present

## 2013-03-27 DIAGNOSIS — M899 Disorder of bone, unspecified: Secondary | ICD-10-CM | POA: Diagnosis not present

## 2013-05-07 ENCOUNTER — Ambulatory Visit: Payer: Medicare Other | Admitting: Internal Medicine

## 2013-05-28 DIAGNOSIS — Z23 Encounter for immunization: Secondary | ICD-10-CM | POA: Diagnosis not present

## 2013-08-01 DIAGNOSIS — M1710 Unilateral primary osteoarthritis, unspecified knee: Secondary | ICD-10-CM | POA: Diagnosis not present

## 2013-08-01 DIAGNOSIS — M25569 Pain in unspecified knee: Secondary | ICD-10-CM | POA: Diagnosis not present

## 2013-08-01 DIAGNOSIS — M2240 Chondromalacia patellae, unspecified knee: Secondary | ICD-10-CM | POA: Diagnosis not present

## 2013-09-24 DIAGNOSIS — Z1231 Encounter for screening mammogram for malignant neoplasm of breast: Secondary | ICD-10-CM | POA: Diagnosis not present

## 2013-10-08 DIAGNOSIS — M79609 Pain in unspecified limb: Secondary | ICD-10-CM

## 2013-12-03 DIAGNOSIS — H259 Unspecified age-related cataract: Secondary | ICD-10-CM | POA: Diagnosis not present

## 2013-12-03 DIAGNOSIS — H43819 Vitreous degeneration, unspecified eye: Secondary | ICD-10-CM | POA: Diagnosis not present

## 2013-12-03 DIAGNOSIS — H04129 Dry eye syndrome of unspecified lacrimal gland: Secondary | ICD-10-CM | POA: Diagnosis not present

## 2013-12-03 DIAGNOSIS — H52209 Unspecified astigmatism, unspecified eye: Secondary | ICD-10-CM | POA: Diagnosis not present

## 2013-12-26 DIAGNOSIS — L570 Actinic keratosis: Secondary | ICD-10-CM | POA: Diagnosis not present

## 2013-12-26 DIAGNOSIS — D235 Other benign neoplasm of skin of trunk: Secondary | ICD-10-CM | POA: Diagnosis not present

## 2013-12-26 DIAGNOSIS — C44319 Basal cell carcinoma of skin of other parts of face: Secondary | ICD-10-CM | POA: Diagnosis not present

## 2014-01-23 DIAGNOSIS — L570 Actinic keratosis: Secondary | ICD-10-CM | POA: Diagnosis not present

## 2014-04-02 DIAGNOSIS — E785 Hyperlipidemia, unspecified: Secondary | ICD-10-CM | POA: Diagnosis not present

## 2014-04-02 DIAGNOSIS — I10 Essential (primary) hypertension: Secondary | ICD-10-CM | POA: Diagnosis not present

## 2014-04-02 DIAGNOSIS — R82998 Other abnormal findings in urine: Secondary | ICD-10-CM | POA: Diagnosis not present

## 2014-04-02 DIAGNOSIS — M899 Disorder of bone, unspecified: Secondary | ICD-10-CM | POA: Diagnosis not present

## 2014-04-02 DIAGNOSIS — E039 Hypothyroidism, unspecified: Secondary | ICD-10-CM | POA: Diagnosis not present

## 2014-04-09 DIAGNOSIS — Z0000 Encounter for general adult medical examination without abnormal findings: Secondary | ICD-10-CM | POA: Diagnosis not present

## 2014-04-09 DIAGNOSIS — H8110 Benign paroxysmal vertigo, unspecified ear: Secondary | ICD-10-CM | POA: Diagnosis not present

## 2014-04-09 DIAGNOSIS — Z79899 Other long term (current) drug therapy: Secondary | ICD-10-CM | POA: Diagnosis not present

## 2014-04-09 DIAGNOSIS — Z1212 Encounter for screening for malignant neoplasm of rectum: Secondary | ICD-10-CM | POA: Diagnosis not present

## 2014-04-09 DIAGNOSIS — R609 Edema, unspecified: Secondary | ICD-10-CM | POA: Diagnosis not present

## 2014-04-09 DIAGNOSIS — Z1331 Encounter for screening for depression: Secondary | ICD-10-CM | POA: Diagnosis not present

## 2014-04-09 DIAGNOSIS — E210 Primary hyperparathyroidism: Secondary | ICD-10-CM | POA: Diagnosis not present

## 2014-04-09 DIAGNOSIS — E785 Hyperlipidemia, unspecified: Secondary | ICD-10-CM | POA: Diagnosis not present

## 2014-05-08 DIAGNOSIS — Z23 Encounter for immunization: Secondary | ICD-10-CM | POA: Diagnosis not present

## 2014-06-07 DIAGNOSIS — L900 Lichen sclerosus et atrophicus: Secondary | ICD-10-CM | POA: Diagnosis not present

## 2014-06-07 DIAGNOSIS — K649 Unspecified hemorrhoids: Secondary | ICD-10-CM | POA: Diagnosis not present

## 2014-06-07 DIAGNOSIS — Z124 Encounter for screening for malignant neoplasm of cervix: Secondary | ICD-10-CM | POA: Diagnosis not present

## 2014-10-01 DIAGNOSIS — I10 Essential (primary) hypertension: Secondary | ICD-10-CM | POA: Diagnosis not present

## 2014-10-01 DIAGNOSIS — E039 Hypothyroidism, unspecified: Secondary | ICD-10-CM | POA: Diagnosis not present

## 2014-10-02 DIAGNOSIS — L900 Lichen sclerosus et atrophicus: Secondary | ICD-10-CM | POA: Diagnosis not present

## 2014-10-03 DIAGNOSIS — Z1231 Encounter for screening mammogram for malignant neoplasm of breast: Secondary | ICD-10-CM | POA: Diagnosis not present

## 2014-10-17 ENCOUNTER — Encounter: Payer: Medicare Other | Admitting: Nurse Practitioner

## 2014-10-31 ENCOUNTER — Ambulatory Visit (INDEPENDENT_AMBULATORY_CARE_PROVIDER_SITE_OTHER): Payer: Medicare Other | Admitting: Nurse Practitioner

## 2014-10-31 ENCOUNTER — Encounter: Payer: Self-pay | Admitting: Nurse Practitioner

## 2014-10-31 VITALS — BP 150/60 | HR 96 | Ht 66.5 in | Wt 193.1 lb

## 2014-10-31 DIAGNOSIS — R1310 Dysphagia, unspecified: Secondary | ICD-10-CM | POA: Insufficient documentation

## 2014-10-31 DIAGNOSIS — R1314 Dysphagia, pharyngoesophageal phase: Secondary | ICD-10-CM

## 2014-10-31 DIAGNOSIS — R1319 Other dysphagia: Secondary | ICD-10-CM

## 2014-10-31 NOTE — Progress Notes (Signed)
     History of Present Illness:  Patient is a 80 year old female known to Dr. [name]. She has a history of diverticular disease, status post resection 2004. Patient is referred now by her PCP for evaluation of dysphagia which has been going on for about a year. She has problems swallowing solid food several times a month, no certain foods in particular. Swallowing problems are always preceded by hiccups. Occasionally the dysphagia has led to vomiting. No weight loss. She has occasional heartburn especially with spicy foods. No other gastrointestinal complaints.  Current Medications, Allergies, Past Medical History, Past Surgical History, Family History and Social History were reviewed in [employer] record.  Physical Exam: General: Pleasant, well developed , white female in no acute distress Head: Normocephalic and atraumatic Eyes:  sclerae anicteric, conjunctiva pink  Neck:  Two dime sized nodules, one over the other located right anterior neck. Ears: Normal auditory acuity Lungs: Clear throughout to auscultation Heart: Regular rate and rhythm Abdomen: Soft, non distended, non-tender. No masses, no hepatomegaly. Normal bowel sounds Musculoskeletal: Symmetrical with no gross deformities  Extremities: No edema  Neurological: Alert oriented x 4, grossly nonfocal Psychological:  Alert and cooperative. Normal mood and affect  Assessment and Recommendations:  47. 80 year old female with one-year history of solid food dysphagia without weight loss. No significant GERD symptoms. For further evaluation patient will be scheduled for upper endoscopy with possible dilation. The benefits, risks, and potential complications of EGD with possible biopsies and/or dilation were discussed with the patient and she agrees to proceed. We discussed the importance of eating slowly, taking small bites of food, chewing well and consuming adequate amounts of fluid in between bites to avoid food  impaction.  2. Colon cancer screening. Patient has not had a colonoscopy in 10 years but is not interested in one (at least at this time).  CC: Crist Infante, MD

## 2014-10-31 NOTE — Patient Instructions (Signed)

## 2014-10-31 NOTE — Progress Notes (Signed)
Agree with initial assessment and plans as outlined 

## 2014-11-06 ENCOUNTER — Ambulatory Visit (AMBULATORY_SURGERY_CENTER): Payer: Medicare Other | Admitting: Internal Medicine

## 2014-11-06 ENCOUNTER — Encounter: Payer: Self-pay | Admitting: Internal Medicine

## 2014-11-06 VITALS — BP 141/75 | HR 81 | Temp 98.3°F | Resp 20 | Ht 66.0 in | Wt 193.0 lb

## 2014-11-06 DIAGNOSIS — K222 Esophageal obstruction: Secondary | ICD-10-CM

## 2014-11-06 DIAGNOSIS — R1314 Dysphagia, pharyngoesophageal phase: Secondary | ICD-10-CM

## 2014-11-06 DIAGNOSIS — R1310 Dysphagia, unspecified: Secondary | ICD-10-CM

## 2014-11-06 DIAGNOSIS — K2100 Gastro-esophageal reflux disease with esophagitis, without bleeding: Secondary | ICD-10-CM

## 2014-11-06 DIAGNOSIS — R1319 Other dysphagia: Secondary | ICD-10-CM

## 2014-11-06 MED ORDER — SODIUM CHLORIDE 0.9 % IV SOLN: 500.0000 mL | INTRAVENOUS | Status: DC

## 2014-11-06 MED ORDER — OMEPRAZOLE 20 MG PO CPDR: 20.0000 mg | DELAYED_RELEASE_CAPSULE | Freq: Every day | ORAL | Status: DC

## 2014-11-06 NOTE — Progress Notes (Signed)
Patient awakening,vss,report to rn 

## 2014-11-06 NOTE — Op Note (Signed)
[facility]  [address] [place], [id number]   ENDOSCOPY PROCEDURE REPORT  PATIENT: Janet Mccarthy, Janet Mccarthy  MR#: [id number] BIRTHDATE: 10-Sep-1934 , 48  yrs. old GENDER: female ENDOSCOPIST: Eustace Quail, MD REFERRED BY:  Crist Infante, M.D. PROCEDURE DATE:  11/06/2014 PROCEDURE:  EGD, diagnostic and Maloney dilation of esophagus   - 54 ASA CLASS:     Class II INDICATIONS:  dysphagia. MEDICATIONS: Monitored anesthesia care and Propofol 150 mg IV TOPICAL ANESTHETIC: none  DESCRIPTION OF PROCEDURE: After the risks benefits and alternatives of the procedure were thoroughly explained, informed consent was obtained.  The LB [id number] endoscope was introduced through the mouth and advanced to the second portion of the duodenum , Without limitations.  The instrument was slowly withdrawn as the mucosa was fully examined.   EXAM:The esophagus revealed a fibrous ring the like stricture at the gastroesophageal junction (35 cm) measuring approximately 14 mm. There was friability and edema.  The stomach revealed a moderate hiatal hernia but was otherwise normal.  The duodenum was normal. Retroflexed views revealed a hiatal hernia.     The scope was then withdrawn from the patient and the procedure completed. THERAPY: A 54 French Maloney dilator was passed into the esophagus without resistance or heme. Relook EGD revealed disruption of the fibrous ring with minor bleeding. Tolerated well  COMPLICATIONS: There were no immediate complications.  ENDOSCOPIC IMPRESSION: 1. GERD with esophagitis and peptic stricture status post dilation   RECOMMENDATIONS: 1.  Clear liquids until 11 AM, then soft foods rest of day.  Resume prior diet tomorrow. 2.  Prescribe omeprazole 20 mg daily; #30; 11 refills 3.  Call office next 2-3 days to schedule an office appointment for about 6 weeks with Dr. [name]  REPEAT EXAM:  eSigned:  Eustace Quail, MD 11/06/2014 9:22  AM    XL:KGMW Perini, MD and The Patient

## 2014-11-06 NOTE — Patient Instructions (Addendum)
YOU HAD AN ENDOSCOPIC PROCEDURE TODAY AT [facility]:   Refer to the procedure report that was given to you for any specific questions about what was found during the examination.  If the procedure report does not answer your questions, please call your gastroenterologist to clarify.  If you requested that your care partner not be given the details of your procedure findings, then the procedure report has been included in a sealed envelope for you to review at your convenience later.  YOU SHOULD EXPECT: Some feelings of bloating in the abdomen. Passage of more gas than usual.  Walking can help get rid of the air that was put into your GI tract during the procedure and reduce the bloating. If you had a lower endoscopy (such as a colonoscopy or flexible sigmoidoscopy) you may notice spotting of blood in your stool or on the toilet paper. If you underwent a bowel prep for your procedure, you may not have a normal bowel movement for a few days.  Please Note:  You might notice some irritation and congestion in your nose or some drainage.  This is from the oxygen used during your procedure.  There is no need for concern and it should clear up in a day or so.  SYMPTOMS TO REPORT IMMEDIATELY:   Following upper endoscopy (EGD)  Vomiting of blood or coffee ground material  New chest pain or pain under the shoulder blades  Painful or persistently difficult swallowing  New shortness of breath  Fever of 100F or higher  Black, tarry-looking stools  For urgent or emergent issues, a gastroenterologist can be reached at any hour by calling [phone].   DIET: See Dilation diet- Drink plenty of fluids but you should avoid alcoholic beverages for 24 hours.  ACTIVITY:  You should plan to take it easy for the rest of today and you should NOT DRIVE or use heavy machinery until tomorrow (because of the sedation medicines used during the test).    FOLLOW UP: Our staff will call the number  listed on your records the next business day following your procedure to check on you and address any questions or concerns that you may have regarding the information given to you following your procedure. If we do not reach you, we will leave a message.  However, if you are feeling well and you are not experiencing any problems, there is no need to return our call.  We will assume that you have returned to your regular daily activities without incident.  If any biopsies were taken you will be contacted by phone or by letter within the next 1-3 weeks.  Please call us at [phone] if you have not heard about the biopsies in 3 weeks.    SIGNATURES/CONFIDENTIALITY: You and/or your care partner have signed paperwork which will be entered into your electronic medical record.  These signatures attest to the fact that that the information above on your After Visit Summary has been reviewed and is understood.  Full responsibility of the confidentiality of this discharge information lies with you and/or your care-partner.  Dilation diet, stricture-handouts given.  Omeprazole prescription sent to pharmacy.

## 2014-11-06 NOTE — Progress Notes (Signed)
Called to room to assist during endoscopic procedure.  Patient ID and intended procedure confirmed with present staff. Received instructions for my participation in the procedure from the performing physician.  

## 2014-11-07 ENCOUNTER — Telehealth: Payer: Self-pay | Admitting: *Deleted

## 2014-11-07 NOTE — Telephone Encounter (Signed)
  Follow up Call-  Call back number 11/06/2014  Post procedure Call Back phone  # [phone]  Permission to leave phone message Yes     Patient questions:  Do you have a fever, pain , or abdominal swelling? No. Pain Score  0 *  Have you tolerated food without any problems? Yes.    Have you been able to return to your normal activities? Yes.    Do you have any questions about your discharge instructions: Diet   No. Medications  No. Follow up visit  No.  Do you have questions or concerns about your Care? No.  Actions: * If pain score is 4 or above: No action needed, pain <4.

## 2014-12-05 DIAGNOSIS — H25813 Combined forms of age-related cataract, bilateral: Secondary | ICD-10-CM | POA: Diagnosis not present

## 2014-12-05 DIAGNOSIS — H43813 Vitreous degeneration, bilateral: Secondary | ICD-10-CM | POA: Diagnosis not present

## 2014-12-05 DIAGNOSIS — H04123 Dry eye syndrome of bilateral lacrimal glands: Secondary | ICD-10-CM | POA: Diagnosis not present

## 2014-12-13 ENCOUNTER — Encounter: Payer: Self-pay | Admitting: Internal Medicine

## 2014-12-13 ENCOUNTER — Ambulatory Visit (INDEPENDENT_AMBULATORY_CARE_PROVIDER_SITE_OTHER): Payer: Medicare Other | Admitting: Internal Medicine

## 2014-12-13 VITALS — BP 136/62 | HR 88 | Ht 66.5 in | Wt 191.4 lb

## 2014-12-13 DIAGNOSIS — K222 Esophageal obstruction: Secondary | ICD-10-CM | POA: Diagnosis not present

## 2014-12-13 DIAGNOSIS — K2100 Gastro-esophageal reflux disease with esophagitis, without bleeding: Secondary | ICD-10-CM

## 2014-12-13 NOTE — Patient Instructions (Signed)
Please follow up with Dr. Perry in one year 

## 2014-12-13 NOTE — Progress Notes (Signed)
HISTORY OF PRESENT ILLNESS:  Janet Mccarthy is a 80 y.o. female with the below listed medical history who was seen remotely for complicated diverticular disease leading to surgery in 2005. Also evaluated previously for mild elevation of liver tests. Most recently seen by the physician assistant 31st 2016 for dysphagia. See that dictation. She subsequently underwent upper endoscopy 11/06/2014. Examination revealed reflux esophagitis with peptic stricture which was dilated with 54 French Maloney dilator. She was prescribed omeprazole 20 mg daily and asked to follow-up at this time. The patient's please report that she has had no further dysphagia. However, she has been noncompliant with omeprazole therapy stating "I just like to take medicines". She does have intermittent reflux symptoms. GI review of systems today is otherwise negative.  REVIEW OF SYSTEMS:  All non-GI ROS negative upon comprehensive review  Past Medical History  Diagnosis Date  . Diverticular disease     sigmoid  . Diverticulitis   . Retroperitoneal mass   . Diverticulosis   . Hyperlipidemia   . IFG (impaired fasting glucose)   . Osteopenia   . Hyperparathyroidism   . Osteoarthritis   . Hypothyroidism   . Vitamin D deficiency   . Depression   . Arthritis     right hip  . Multiple lipomas   . Skin cancer     nose  . Gallstones   . GERD (gastroesophageal reflux disease)     Past Surgical History  Procedure Laterality Date  . Tonsillectomy and adenoidectomy    . Total hip arthroplasty  2004    right  . Colon resection    . Tumor removal      benign retriperitoneal    Social History KALEB LINQUIST  reports that she quit smoking about 43 years ago. She does not have any smokeless tobacco history on file. She reports that she does not drink alcohol or use illicit drugs.  family history is not on file. She was adopted.  Allergies  Allergen Reactions  . Sulfa Antibiotics        PHYSICAL  EXAMINATION: Vital signs: BP 136/62 mmHg  Pulse 88  Ht 5' 6.5" (1.689 m)  Wt 191 lb 6 oz (86.807 kg)  BMI 30.43 kg/m2 General: Well-developed, well-nourished, no acute distress HEENT: Sclerae are anicteric, conjunctiva pink. Oral mucosa intact Lungs: Clear Heart: Regular Abdomen: soft, nontender, nondistended, no obvious ascites, no peritoneal signs, normal bowel sounds. No organomegaly. Extremities: No edema Psychiatric: alert and oriented x3. Cooperative     ASSESSMENT:  #1. GERD complicated by esophagitis and peptic stricture. Status post esophageal dilation. Still with reflux symptoms. Not taking omeprazole #2. Dysphagia secondary to peptic stricture. Improved post dilation   PLAN:  #1. Reflux precautions #2. Initiate omeprazole 20 mg daily as previously recommended. I reviewed with her the risk benefit profile of this medical therapy. #3. Routine office follow-up in 1 year. Sooner if needed #4. Ongoing general medical care with Dr. Joylene Draft  20 minutes was spent face-to-face with this patient. Greater than 50% of the time was used for counseling her regarding her complicated GERD and esophageal stricture

## 2015-04-08 DIAGNOSIS — M859 Disorder of bone density and structure, unspecified: Secondary | ICD-10-CM | POA: Diagnosis not present

## 2015-05-13 DIAGNOSIS — R7301 Impaired fasting glucose: Secondary | ICD-10-CM | POA: Diagnosis not present

## 2015-05-13 DIAGNOSIS — R8299 Other abnormal findings in urine: Secondary | ICD-10-CM | POA: Diagnosis not present

## 2015-05-13 DIAGNOSIS — E559 Vitamin D deficiency, unspecified: Secondary | ICD-10-CM | POA: Diagnosis not present

## 2015-05-13 DIAGNOSIS — E785 Hyperlipidemia, unspecified: Secondary | ICD-10-CM | POA: Diagnosis not present

## 2015-05-13 DIAGNOSIS — E039 Hypothyroidism, unspecified: Secondary | ICD-10-CM | POA: Diagnosis not present

## 2015-05-13 DIAGNOSIS — I10 Essential (primary) hypertension: Secondary | ICD-10-CM | POA: Diagnosis not present

## 2015-05-13 DIAGNOSIS — N390 Urinary tract infection, site not specified: Secondary | ICD-10-CM | POA: Diagnosis not present

## 2015-05-20 DIAGNOSIS — E210 Primary hyperparathyroidism: Secondary | ICD-10-CM | POA: Diagnosis not present

## 2015-05-20 DIAGNOSIS — Z1389 Encounter for screening for other disorder: Secondary | ICD-10-CM | POA: Diagnosis not present

## 2015-05-20 DIAGNOSIS — E785 Hyperlipidemia, unspecified: Secondary | ICD-10-CM | POA: Diagnosis not present

## 2015-05-20 DIAGNOSIS — Z23 Encounter for immunization: Secondary | ICD-10-CM | POA: Diagnosis not present

## 2015-05-20 DIAGNOSIS — Z0000 Encounter for general adult medical examination without abnormal findings: Secondary | ICD-10-CM | POA: Diagnosis not present

## 2015-05-20 DIAGNOSIS — K649 Unspecified hemorrhoids: Secondary | ICD-10-CM | POA: Diagnosis not present

## 2015-05-20 DIAGNOSIS — Z6829 Body mass index (BMI) 29.0-29.9, adult: Secondary | ICD-10-CM | POA: Diagnosis not present

## 2015-05-20 DIAGNOSIS — K222 Esophageal obstruction: Secondary | ICD-10-CM | POA: Diagnosis not present

## 2015-05-20 DIAGNOSIS — R609 Edema, unspecified: Secondary | ICD-10-CM | POA: Diagnosis not present

## 2015-05-20 DIAGNOSIS — R748 Abnormal levels of other serum enzymes: Secondary | ICD-10-CM | POA: Diagnosis not present

## 2015-07-14 DIAGNOSIS — K644 Residual hemorrhoidal skin tags: Secondary | ICD-10-CM | POA: Diagnosis not present

## 2015-07-14 DIAGNOSIS — Z01419 Encounter for gynecological examination (general) (routine) without abnormal findings: Secondary | ICD-10-CM | POA: Diagnosis not present

## 2015-07-14 DIAGNOSIS — Z124 Encounter for screening for malignant neoplasm of cervix: Secondary | ICD-10-CM | POA: Diagnosis not present

## 2015-07-14 DIAGNOSIS — N762 Acute vulvitis: Secondary | ICD-10-CM | POA: Diagnosis not present

## 2015-07-14 DIAGNOSIS — L900 Lichen sclerosus et atrophicus: Secondary | ICD-10-CM | POA: Diagnosis not present

## 2015-08-05 DIAGNOSIS — J209 Acute bronchitis, unspecified: Secondary | ICD-10-CM | POA: Diagnosis not present

## 2015-08-05 DIAGNOSIS — R05 Cough: Secondary | ICD-10-CM | POA: Diagnosis not present

## 2015-08-05 DIAGNOSIS — J449 Chronic obstructive pulmonary disease, unspecified: Secondary | ICD-10-CM | POA: Diagnosis not present

## 2015-08-05 DIAGNOSIS — Z6829 Body mass index (BMI) 29.0-29.9, adult: Secondary | ICD-10-CM | POA: Diagnosis not present

## 2015-09-02 DIAGNOSIS — Z23 Encounter for immunization: Secondary | ICD-10-CM | POA: Diagnosis not present

## 2015-09-02 DIAGNOSIS — E038 Other specified hypothyroidism: Secondary | ICD-10-CM | POA: Diagnosis not present

## 2015-12-08 DIAGNOSIS — H04123 Dry eye syndrome of bilateral lacrimal glands: Secondary | ICD-10-CM | POA: Diagnosis not present

## 2015-12-08 DIAGNOSIS — H43813 Vitreous degeneration, bilateral: Secondary | ICD-10-CM | POA: Diagnosis not present

## 2015-12-08 DIAGNOSIS — H524 Presbyopia: Secondary | ICD-10-CM | POA: Diagnosis not present

## 2015-12-08 DIAGNOSIS — H25813 Combined forms of age-related cataract, bilateral: Secondary | ICD-10-CM | POA: Diagnosis not present

## 2015-12-08 DIAGNOSIS — Z1231 Encounter for screening mammogram for malignant neoplasm of breast: Secondary | ICD-10-CM | POA: Diagnosis not present

## 2015-12-16 DIAGNOSIS — H25032 Anterior subcapsular polar age-related cataract, left eye: Secondary | ICD-10-CM | POA: Diagnosis not present

## 2015-12-16 DIAGNOSIS — H2512 Age-related nuclear cataract, left eye: Secondary | ICD-10-CM | POA: Diagnosis not present

## 2015-12-16 DIAGNOSIS — H25012 Cortical age-related cataract, left eye: Secondary | ICD-10-CM | POA: Diagnosis not present

## 2015-12-16 DIAGNOSIS — H269 Unspecified cataract: Secondary | ICD-10-CM | POA: Diagnosis not present

## 2015-12-24 DIAGNOSIS — H0015 Chalazion left lower eyelid: Secondary | ICD-10-CM | POA: Diagnosis not present

## 2015-12-24 DIAGNOSIS — H0012 Chalazion right lower eyelid: Secondary | ICD-10-CM | POA: Diagnosis not present

## 2016-01-21 DIAGNOSIS — H0015 Chalazion left lower eyelid: Secondary | ICD-10-CM | POA: Diagnosis not present

## 2016-01-21 DIAGNOSIS — H0012 Chalazion right lower eyelid: Secondary | ICD-10-CM | POA: Diagnosis not present

## 2016-02-11 DIAGNOSIS — L570 Actinic keratosis: Secondary | ICD-10-CM | POA: Diagnosis not present

## 2016-02-11 DIAGNOSIS — X32XXXD Exposure to sunlight, subsequent encounter: Secondary | ICD-10-CM | POA: Diagnosis not present

## 2016-02-11 DIAGNOSIS — D225 Melanocytic nevi of trunk: Secondary | ICD-10-CM | POA: Diagnosis not present

## 2016-03-16 DIAGNOSIS — M545 Low back pain: Secondary | ICD-10-CM | POA: Diagnosis not present

## 2016-03-16 DIAGNOSIS — M25552 Pain in left hip: Secondary | ICD-10-CM | POA: Diagnosis not present

## 2016-03-16 DIAGNOSIS — M1612 Unilateral primary osteoarthritis, left hip: Secondary | ICD-10-CM | POA: Diagnosis not present

## 2016-05-01 DIAGNOSIS — Z23 Encounter for immunization: Secondary | ICD-10-CM | POA: Diagnosis not present

## 2016-06-16 DIAGNOSIS — I10 Essential (primary) hypertension: Secondary | ICD-10-CM | POA: Diagnosis not present

## 2016-06-16 DIAGNOSIS — R8299 Other abnormal findings in urine: Secondary | ICD-10-CM | POA: Diagnosis not present

## 2016-06-16 DIAGNOSIS — N390 Urinary tract infection, site not specified: Secondary | ICD-10-CM | POA: Diagnosis not present

## 2016-06-16 DIAGNOSIS — R7301 Impaired fasting glucose: Secondary | ICD-10-CM | POA: Diagnosis not present

## 2016-06-16 DIAGNOSIS — E038 Other specified hypothyroidism: Secondary | ICD-10-CM | POA: Diagnosis not present

## 2016-06-16 DIAGNOSIS — E559 Vitamin D deficiency, unspecified: Secondary | ICD-10-CM | POA: Diagnosis not present

## 2016-06-16 DIAGNOSIS — E784 Other hyperlipidemia: Secondary | ICD-10-CM | POA: Diagnosis not present

## 2016-06-23 DIAGNOSIS — E038 Other specified hypothyroidism: Secondary | ICD-10-CM | POA: Diagnosis not present

## 2016-06-23 DIAGNOSIS — R7301 Impaired fasting glucose: Secondary | ICD-10-CM | POA: Diagnosis not present

## 2016-06-23 DIAGNOSIS — R748 Abnormal levels of other serum enzymes: Secondary | ICD-10-CM | POA: Diagnosis not present

## 2016-06-23 DIAGNOSIS — M859 Disorder of bone density and structure, unspecified: Secondary | ICD-10-CM | POA: Diagnosis not present

## 2016-06-23 DIAGNOSIS — Z0000 Encounter for general adult medical examination without abnormal findings: Secondary | ICD-10-CM | POA: Diagnosis not present

## 2016-06-23 DIAGNOSIS — H8113 Benign paroxysmal vertigo, bilateral: Secondary | ICD-10-CM | POA: Diagnosis not present

## 2016-06-23 DIAGNOSIS — Z6829 Body mass index (BMI) 29.0-29.9, adult: Secondary | ICD-10-CM | POA: Diagnosis not present

## 2016-06-23 DIAGNOSIS — Z1389 Encounter for screening for other disorder: Secondary | ICD-10-CM | POA: Diagnosis not present

## 2016-06-23 DIAGNOSIS — E784 Other hyperlipidemia: Secondary | ICD-10-CM | POA: Diagnosis not present

## 2016-06-23 DIAGNOSIS — E559 Vitamin D deficiency, unspecified: Secondary | ICD-10-CM | POA: Diagnosis not present

## 2016-06-23 DIAGNOSIS — I10 Essential (primary) hypertension: Secondary | ICD-10-CM | POA: Diagnosis not present

## 2016-07-05 NOTE — Progress Notes (Signed)
This encounter was created in error - please disregard.

## 2016-08-05 DIAGNOSIS — L900 Lichen sclerosus et atrophicus: Secondary | ICD-10-CM | POA: Diagnosis not present

## 2016-09-27 DIAGNOSIS — E038 Other specified hypothyroidism: Secondary | ICD-10-CM | POA: Diagnosis not present

## 2016-09-27 DIAGNOSIS — K754 Autoimmune hepatitis: Secondary | ICD-10-CM | POA: Diagnosis not present

## 2016-09-27 DIAGNOSIS — M859 Disorder of bone density and structure, unspecified: Secondary | ICD-10-CM | POA: Diagnosis not present

## 2016-11-15 DIAGNOSIS — L570 Actinic keratosis: Secondary | ICD-10-CM | POA: Diagnosis not present

## 2016-11-15 DIAGNOSIS — X32XXXD Exposure to sunlight, subsequent encounter: Secondary | ICD-10-CM | POA: Diagnosis not present

## 2016-11-23 DIAGNOSIS — R05 Cough: Secondary | ICD-10-CM | POA: Diagnosis not present

## 2016-11-23 DIAGNOSIS — J449 Chronic obstructive pulmonary disease, unspecified: Secondary | ICD-10-CM | POA: Diagnosis not present

## 2016-11-23 DIAGNOSIS — Z6829 Body mass index (BMI) 29.0-29.9, adult: Secondary | ICD-10-CM | POA: Diagnosis not present

## 2016-11-23 DIAGNOSIS — I10 Essential (primary) hypertension: Secondary | ICD-10-CM | POA: Diagnosis not present

## 2016-12-07 DIAGNOSIS — I10 Essential (primary) hypertension: Secondary | ICD-10-CM | POA: Diagnosis not present

## 2016-12-07 DIAGNOSIS — J449 Chronic obstructive pulmonary disease, unspecified: Secondary | ICD-10-CM | POA: Diagnosis not present

## 2016-12-07 DIAGNOSIS — R05 Cough: Secondary | ICD-10-CM | POA: Diagnosis not present

## 2016-12-07 DIAGNOSIS — Z6829 Body mass index (BMI) 29.0-29.9, adult: Secondary | ICD-10-CM | POA: Diagnosis not present

## 2016-12-07 DIAGNOSIS — H00013 Hordeolum externum right eye, unspecified eyelid: Secondary | ICD-10-CM | POA: Diagnosis not present

## 2016-12-08 DIAGNOSIS — Z1231 Encounter for screening mammogram for malignant neoplasm of breast: Secondary | ICD-10-CM | POA: Diagnosis not present

## 2016-12-29 DIAGNOSIS — H61001 Unspecified perichondritis of right external ear: Secondary | ICD-10-CM | POA: Diagnosis not present

## 2017-02-07 DIAGNOSIS — L578 Other skin changes due to chronic exposure to nonionizing radiation: Secondary | ICD-10-CM | POA: Diagnosis not present

## 2017-02-07 DIAGNOSIS — D485 Neoplasm of uncertain behavior of skin: Secondary | ICD-10-CM | POA: Diagnosis not present

## 2017-02-07 DIAGNOSIS — H61001 Unspecified perichondritis of right external ear: Secondary | ICD-10-CM | POA: Diagnosis not present

## 2017-03-11 DIAGNOSIS — L570 Actinic keratosis: Secondary | ICD-10-CM | POA: Diagnosis not present

## 2017-03-11 DIAGNOSIS — H61001 Unspecified perichondritis of right external ear: Secondary | ICD-10-CM | POA: Diagnosis not present

## 2017-04-30 DIAGNOSIS — Z23 Encounter for immunization: Secondary | ICD-10-CM | POA: Diagnosis not present

## 2017-05-30 DIAGNOSIS — H0014 Chalazion left upper eyelid: Secondary | ICD-10-CM | POA: Diagnosis not present

## 2017-05-30 DIAGNOSIS — H0012 Chalazion right lower eyelid: Secondary | ICD-10-CM | POA: Diagnosis not present

## 2017-06-22 DIAGNOSIS — R7301 Impaired fasting glucose: Secondary | ICD-10-CM | POA: Diagnosis not present

## 2017-06-22 DIAGNOSIS — E559 Vitamin D deficiency, unspecified: Secondary | ICD-10-CM | POA: Diagnosis not present

## 2017-06-22 DIAGNOSIS — E038 Other specified hypothyroidism: Secondary | ICD-10-CM | POA: Diagnosis not present

## 2017-06-22 DIAGNOSIS — E7849 Other hyperlipidemia: Secondary | ICD-10-CM | POA: Diagnosis not present

## 2017-06-22 DIAGNOSIS — I10 Essential (primary) hypertension: Secondary | ICD-10-CM | POA: Diagnosis not present

## 2017-06-28 DIAGNOSIS — H0014 Chalazion left upper eyelid: Secondary | ICD-10-CM | POA: Diagnosis not present

## 2017-06-28 DIAGNOSIS — H2511 Age-related nuclear cataract, right eye: Secondary | ICD-10-CM | POA: Diagnosis not present

## 2017-06-28 DIAGNOSIS — H5213 Myopia, bilateral: Secondary | ICD-10-CM | POA: Diagnosis not present

## 2017-07-01 DIAGNOSIS — H00013 Hordeolum externum right eye, unspecified eyelid: Secondary | ICD-10-CM | POA: Diagnosis not present

## 2017-07-01 DIAGNOSIS — J449 Chronic obstructive pulmonary disease, unspecified: Secondary | ICD-10-CM | POA: Diagnosis not present

## 2017-07-01 DIAGNOSIS — Z1389 Encounter for screening for other disorder: Secondary | ICD-10-CM | POA: Diagnosis not present

## 2017-07-01 DIAGNOSIS — Z0000 Encounter for general adult medical examination without abnormal findings: Secondary | ICD-10-CM | POA: Diagnosis not present

## 2017-07-01 DIAGNOSIS — K219 Gastro-esophageal reflux disease without esophagitis: Secondary | ICD-10-CM | POA: Diagnosis not present

## 2017-07-01 DIAGNOSIS — E7849 Other hyperlipidemia: Secondary | ICD-10-CM | POA: Diagnosis not present

## 2017-07-01 DIAGNOSIS — K222 Esophageal obstruction: Secondary | ICD-10-CM | POA: Diagnosis not present

## 2017-07-01 DIAGNOSIS — Z23 Encounter for immunization: Secondary | ICD-10-CM | POA: Diagnosis not present

## 2017-07-01 DIAGNOSIS — R7301 Impaired fasting glucose: Secondary | ICD-10-CM | POA: Diagnosis not present

## 2017-07-01 DIAGNOSIS — Z6829 Body mass index (BMI) 29.0-29.9, adult: Secondary | ICD-10-CM | POA: Diagnosis not present

## 2017-07-01 DIAGNOSIS — E038 Other specified hypothyroidism: Secondary | ICD-10-CM | POA: Diagnosis not present

## 2017-07-01 DIAGNOSIS — F39 Unspecified mood [affective] disorder: Secondary | ICD-10-CM | POA: Diagnosis not present

## 2017-07-01 DIAGNOSIS — K649 Unspecified hemorrhoids: Secondary | ICD-10-CM | POA: Diagnosis not present

## 2017-08-10 DIAGNOSIS — Z124 Encounter for screening for malignant neoplasm of cervix: Secondary | ICD-10-CM | POA: Diagnosis not present

## 2017-08-10 DIAGNOSIS — L900 Lichen sclerosus et atrophicus: Secondary | ICD-10-CM | POA: Diagnosis not present

## 2017-09-06 DIAGNOSIS — F411 Generalized anxiety disorder: Secondary | ICD-10-CM | POA: Diagnosis not present

## 2017-09-13 DIAGNOSIS — F411 Generalized anxiety disorder: Secondary | ICD-10-CM | POA: Diagnosis not present

## 2017-09-28 DIAGNOSIS — F411 Generalized anxiety disorder: Secondary | ICD-10-CM | POA: Diagnosis not present

## 2017-10-10 DIAGNOSIS — F411 Generalized anxiety disorder: Secondary | ICD-10-CM | POA: Diagnosis not present

## 2017-10-24 DIAGNOSIS — M859 Disorder of bone density and structure, unspecified: Secondary | ICD-10-CM | POA: Diagnosis not present

## 2017-10-27 DIAGNOSIS — F411 Generalized anxiety disorder: Secondary | ICD-10-CM | POA: Diagnosis not present

## 2017-12-09 DIAGNOSIS — Z1231 Encounter for screening mammogram for malignant neoplasm of breast: Secondary | ICD-10-CM | POA: Diagnosis not present

## 2018-02-28 DIAGNOSIS — D3610 Benign neoplasm of peripheral nerves and autonomic nervous system, unspecified: Secondary | ICD-10-CM | POA: Diagnosis not present

## 2018-02-28 DIAGNOSIS — L578 Other skin changes due to chronic exposure to nonionizing radiation: Secondary | ICD-10-CM | POA: Diagnosis not present

## 2018-02-28 DIAGNOSIS — L218 Other seborrheic dermatitis: Secondary | ICD-10-CM | POA: Diagnosis not present

## 2018-02-28 DIAGNOSIS — L570 Actinic keratosis: Secondary | ICD-10-CM | POA: Diagnosis not present

## 2018-04-13 DIAGNOSIS — D3610 Benign neoplasm of peripheral nerves and autonomic nervous system, unspecified: Secondary | ICD-10-CM | POA: Diagnosis not present

## 2018-04-13 DIAGNOSIS — H61001 Unspecified perichondritis of right external ear: Secondary | ICD-10-CM | POA: Diagnosis not present

## 2018-04-29 DIAGNOSIS — Z23 Encounter for immunization: Secondary | ICD-10-CM | POA: Diagnosis not present

## 2018-05-03 DIAGNOSIS — F411 Generalized anxiety disorder: Secondary | ICD-10-CM | POA: Diagnosis not present

## 2018-05-15 DIAGNOSIS — F411 Generalized anxiety disorder: Secondary | ICD-10-CM | POA: Diagnosis not present

## 2018-05-16 ENCOUNTER — Encounter (INDEPENDENT_AMBULATORY_CARE_PROVIDER_SITE_OTHER): Payer: Self-pay

## 2018-05-16 ENCOUNTER — Ambulatory Visit (INDEPENDENT_AMBULATORY_CARE_PROVIDER_SITE_OTHER): Payer: Medicare Other | Admitting: Internal Medicine

## 2018-05-16 ENCOUNTER — Encounter: Payer: Self-pay | Admitting: Internal Medicine

## 2018-05-16 VITALS — BP 130/60 | HR 96 | Ht 66.0 in | Wt 179.0 lb

## 2018-05-16 DIAGNOSIS — R112 Nausea with vomiting, unspecified: Secondary | ICD-10-CM | POA: Diagnosis not present

## 2018-05-16 DIAGNOSIS — K219 Gastro-esophageal reflux disease without esophagitis: Secondary | ICD-10-CM | POA: Diagnosis not present

## 2018-05-16 DIAGNOSIS — K8020 Calculus of gallbladder without cholecystitis without obstruction: Secondary | ICD-10-CM | POA: Diagnosis not present

## 2018-05-16 DIAGNOSIS — R935 Abnormal findings on diagnostic imaging of other abdominal regions, including retroperitoneum: Secondary | ICD-10-CM | POA: Diagnosis not present

## 2018-05-16 MED ORDER — PANTOPRAZOLE SODIUM 40 MG PO TBEC: 40.0000 mg | DELAYED_RELEASE_TABLET | Freq: Every day | ORAL | 11 refills | Status: DC

## 2018-05-16 NOTE — Progress Notes (Signed)
HISTORY OF PRESENT ILLNESS:  Janet Mccarthy is a 83 y.o. female with past medical history as listed below who presents today regarding problems with worsening nausea and vomiting.  Janet Mccarthy has a history of complicated diverticular disease leading to surgery in 2005.  Also GERD complicated by esophagitis and peptic stricture requiring esophageal dilation.  Issues with medical noncompliance.  Janet Mccarthy is accompanied today by her husband.  Janet Mccarthy tells me that Janet Mccarthy did not take PPI as previously prescribed for GERD.  Janet Mccarthy has had intermittent symptoms over the past 3 years.  Has not been seen in this office for over 3 years.  Janet Mccarthy describes problems with pyrosis, particularly at night.  Intermittent waterbrash with episodes of vomiting approximately once every 3 weeks.  Occasionally when they are out with friends having a meal.  Janet Mccarthy thinks the problem is increased in frequency.  Janet Mccarthy describes some dysphasia to solids.  8 to 10 pound weight loss over the past year which Janet Mccarthy claims is intentional.  No hematemesis.  Occasional vague abdominal discomfort though this is minimal by her account.  Janet Mccarthy is known to have gallstones.  Review of outside records shows unremarkable blood counts.  MRI in 2014 demonstrating gallstones.  Janet Mccarthy tells me that Janet Mccarthy took over-the-counter Prilosec but this upset her stomach.  Her weight is down 12 pounds from her last visit 3-1/2 years ago.  REVIEW OF SYSTEMS:  All non-GI ROS negative as otherwise stated in the HPI except for anxiety, depression, urinary leakage  Past Medical History:  Diagnosis Date  . Arthritis    right hip  . Depression   . Diverticular disease    sigmoid  . Diverticulitis   . Diverticulosis   . Gallstones   . GERD (gastroesophageal reflux disease)   . Hyperlipidemia   . Hyperparathyroidism ([place])   . Hypothyroidism   . IFG (impaired fasting glucose)   . Multiple lipomas   . Osteoarthritis   . Osteopenia   . Retroperitoneal mass   . Skin cancer    nose  .  Vitamin D deficiency     Past Surgical History:  Procedure Laterality Date  . COLON RESECTION    . TONSILLECTOMY AND ADENOIDECTOMY    . TOTAL HIP ARTHROPLASTY  2004   right  . tumor removal     benign retriperitoneal    Social History [facility]  reports that Janet Mccarthy quit smoking about 46 years ago. Janet Mccarthy has never used smokeless tobacco. Janet Mccarthy reports that Janet Mccarthy does not drink alcohol or use drugs.  family history is not on file. Janet Mccarthy was adopted.  Allergies  Allergen Reactions  . Sulfa Antibiotics        PHYSICAL EXAMINATION: Vital signs: BP 130/60   Pulse 96   Ht 5\' 6"  (1.676 m)   Wt 179 lb (81.2 kg)   BMI 28.89 kg/m   Constitutional: generally well-appearing, no acute distress Psychiatric: alert and oriented x3, cooperative Eyes: extraocular movements intact, anicteric, conjunctiva pink Mouth: oral pharynx moist, no lesions Neck: supple no lymphadenopathy Cardiovascular: heart regular rate and rhythm, no murmur Lungs: clear to auscultation bilaterally Abdomen: soft, nontender, nondistended, no obvious ascites, no peritoneal signs, normal bowel sounds, no organomegaly.  No succussion splash Rectal: Omitted Extremities: no clubbing, cyanosis, or lower extremity edema bilaterally Skin: no lesions on visible extremities Neuro: No focal deficits.  Cranial nerves intact  ASSESSMENT:  1.  Intermittent problems with vomiting.  Suspect GERD.  Rule out gallstones as a contributor 2.  History of  GERD with esophagitis and peptic stricture 3.  Mild dysphasia 4.  Cholelithiasis on previous imaging   PLAN:  1.  Reflux precautions.  Reviewed.  Literature provided for review as well 2.  Prescribed pantoprazole 40 mg daily.  Submitted to the pharmacy electronically.  Multiple refills.  Compliance stressed 3.  Schedule upper endoscopy.  Consider esophageal dilation.The nature of the procedure, as well as the risks, benefits, and alternatives were carefully and thoroughly  reviewed with the patient. Ample time for discussion and questions allowed. The patient understood, was satisfied, and agreed to proceed. 4.  Schedule abdominal ultrasound.

## 2018-05-16 NOTE — Patient Instructions (Signed)
We have sent the following medications to your pharmacy for you to pick up at your convenience:  Pantoprazole   You have been scheduled for an abdominal ultrasound at [facility] (1st floor of hospital) on 10/18/2019at 8:30am. Please arrive 15 minutes prior to your appointment for registration. Make certain not to have anything to eat or drink after midnight prior to your appointment. Should you need to reschedule your appointment, please contact radiology at [phone]. This test typically takes about 30 minutes to perform.   You have been scheduled for an endoscopy. Please follow written instructions given to you at your visit today. If you use inhalers (even only as needed), please bring them with you on the day of your procedure. Your physician has requested that you go to www.startemmi.com and enter the access code given to you at your visit today. This web site gives a general overview about your procedure. However, you should still follow specific instructions given to you by our office regarding your preparation for the procedure.

## 2018-05-19 ENCOUNTER — Ambulatory Visit (HOSPITAL_COMMUNITY)
Admission: RE | Admit: 2018-05-19 | Discharge: 2018-05-19 | Disposition: A | Discharge status: Home / Self Care | Payer: Medicare Other | Source: Ambulatory Visit | Attending: Internal Medicine | Admitting: Internal Medicine

## 2018-05-19 DIAGNOSIS — R935 Abnormal findings on diagnostic imaging of other abdominal regions, including retroperitoneum: Secondary | ICD-10-CM | POA: Insufficient documentation

## 2018-05-19 DIAGNOSIS — N281 Cyst of kidney, acquired: Secondary | ICD-10-CM | POA: Insufficient documentation

## 2018-05-19 DIAGNOSIS — K219 Gastro-esophageal reflux disease without esophagitis: Secondary | ICD-10-CM | POA: Insufficient documentation

## 2018-05-19 DIAGNOSIS — R112 Nausea with vomiting, unspecified: Secondary | ICD-10-CM | POA: Diagnosis not present

## 2018-05-19 DIAGNOSIS — K8020 Calculus of gallbladder without cholecystitis without obstruction: Secondary | ICD-10-CM | POA: Diagnosis not present

## 2018-05-22 ENCOUNTER — Telehealth: Payer: Self-pay | Admitting: Internal Medicine

## 2018-05-23 NOTE — Telephone Encounter (Signed)
Spoke to [facility] and cancelled Protonix rx sent accidentally.  Spoke to patient and relayed this information.  Patient aware

## 2018-06-02 HISTORY — PX: COLONOSCOPY WITH ESOPHAGOGASTRODUODENOSCOPY (EGD): SHX5779

## 2018-06-05 DIAGNOSIS — F411 Generalized anxiety disorder: Secondary | ICD-10-CM | POA: Diagnosis not present

## 2018-06-08 ENCOUNTER — Ambulatory Visit (AMBULATORY_SURGERY_CENTER): Payer: Medicare Other | Admitting: Internal Medicine

## 2018-06-08 ENCOUNTER — Encounter: Payer: Self-pay | Admitting: Internal Medicine

## 2018-06-08 VITALS — BP 138/70 | HR 82 | Temp 97.8°F | Resp 12 | Ht 66.0 in | Wt 179.0 lb

## 2018-06-08 DIAGNOSIS — R112 Nausea with vomiting, unspecified: Secondary | ICD-10-CM | POA: Diagnosis not present

## 2018-06-08 DIAGNOSIS — K222 Esophageal obstruction: Secondary | ICD-10-CM | POA: Diagnosis not present

## 2018-06-08 DIAGNOSIS — R4702 Dysphasia: Secondary | ICD-10-CM

## 2018-06-08 DIAGNOSIS — I10 Essential (primary) hypertension: Secondary | ICD-10-CM | POA: Diagnosis not present

## 2018-06-08 DIAGNOSIS — K449 Diaphragmatic hernia without obstruction or gangrene: Secondary | ICD-10-CM

## 2018-06-08 DIAGNOSIS — E669 Obesity, unspecified: Secondary | ICD-10-CM | POA: Diagnosis not present

## 2018-06-08 DIAGNOSIS — K219 Gastro-esophageal reflux disease without esophagitis: Secondary | ICD-10-CM

## 2018-06-08 MED ORDER — SODIUM CHLORIDE 0.9 % IV SOLN: 500.0000 mL | Freq: Once | INTRAVENOUS | Status: DC

## 2018-06-08 NOTE — Op Note (Signed)
[place] Patient Name: Janet Mccarthy Procedure Date: 06/08/2018 10:25 AM MRN: [id number] Endoscopist: Janet Mccarthy , MD Age: 83 Referring MD:  Date of Birth: 1935-05-10 Gender: Female Account #: [id number] Procedure:                Upper GI endoscopy with Venia Minks dilation of the                            esophagus, 21 French Indications:              Dysphagia, Esophageal reflux, Nausea with vomiting Medicines:                Monitored Anesthesia Care Procedure:                Pre-Anesthesia Assessment:                           - Prior to the procedure, a History and Physical                            was performed, and patient medications and                            allergies were reviewed. The patient's tolerance of                            previous anesthesia was also reviewed. The risks                            and benefits of the procedure and the sedation                            options and risks were discussed with the patient.                            All questions were answered, and informed consent                            was obtained. Prior Anticoagulants: The patient has                            taken no previous anticoagulant or antiplatelet                            agents. ASA Grade Assessment: II - A patient with                            mild systemic disease. After reviewing the risks                            and benefits, the patient was deemed in                            satisfactory condition to undergo the procedure.  After obtaining informed consent, the endoscope was                            passed under direct vision. Throughout the                            procedure, the patient's blood pressure, pulse, and                            oxygen saturations were monitored continuously. The                            Endoscope was introduced through the mouth, and   advanced to the second part of duodenum. The upper                            GI endoscopy was accomplished without difficulty.                            The patient tolerated the procedure well. Scope In: Scope Out: Findings:                 One benign-appearing, intrinsic moderate stenosis                            was found 34 cm from the incisors. This stenosis                            measured 1.4 cm (inner diameter). The scope was                            withdrawn. Dilation was performed with a Maloney                            dilator with no resistance at 60 Fr.                           The exam of the esophagus was otherwise normal.                           The stomach was normal save moderate sized sliding                            hiatal hernia.                           The examined duodenum was normal.                           The cardia and gastric fundus were normal on                            retroflexion. Complications:            No immediate complications. Estimated Blood Loss:     Estimated blood loss: none. Impression:  1. GERD                           2. Esophageal stricture status post dilation                           3. Otherwise normal exam                           4. Intermittent nausea and vomiting felt likely                            secondary to poorly controlled (previously) reflux                            disease. Recommendation:           1. Post dilation diet                           2. STAY ON PANTOPRAZOLE 40 mg DAILY UNTIL FURTHER                            NOTICE                           3. Office follow-up with Dr. Henrene Mccarthy in 3 months. Janet Chuck. [name], MD 06/08/2018 10:45:00 AM This report has been signed electronically.

## 2018-06-08 NOTE — Progress Notes (Signed)
Report to PACU, RN, vss, BBS= Clear.  

## 2018-06-08 NOTE — Patient Instructions (Signed)
HANDOUTS GIVEN FOR STRICTURE, GERD, POST DILATION DIET  FOLLOW UP APPT IN 3 MONTHS WITH DR [name]  STAY ON PANTOPRAZOLE 40 MG UNTIL FURTHER NOTICE  YOU HAD AN ENDOSCOPIC PROCEDURE TODAY AT THE [facility]:   Refer to the procedure report that was given to you for any specific questions about what was found during the examination.  If the procedure report does not answer your questions, please call your gastroenterologist to clarify.  If you requested that your care partner not be given the details of your procedure findings, then the procedure report has been included in a sealed envelope for you to review at your convenience later.  YOU SHOULD EXPECT: Some feelings of bloating in the abdomen. Passage of more gas than usual.  Walking can help get rid of the air that was put into your GI tract during the procedure and reduce the bloating. If you had a lower endoscopy (such as a colonoscopy or flexible sigmoidoscopy) you may notice spotting of blood in your stool or on the toilet paper. If you underwent a bowel prep for your procedure, you may not have a normal bowel movement for a few days.  Please Note:  You might notice some irritation and congestion in your nose or some drainage.  This is from the oxygen used during your procedure.  There is no need for concern and it should clear up in a day or so.  SYMPTOMS TO REPORT IMMEDIATELY:   Following upper endoscopy (EGD)  Vomiting of blood or coffee ground material  New chest pain or pain under the shoulder blades  Painful or persistently difficult swallowing  New shortness of breath  Fever of 100F or higher  Black, tarry-looking stools  For urgent or emergent issues, a gastroenterologist can be reached at any hour by calling [phone].   DIET:  We do recommend a small meal at first, but then you may proceed to your regular diet.  Drink plenty of fluids but you should avoid alcoholic beverages for 24 hours.  ACTIVITY:  You  should plan to take it easy for the rest of today and you should NOT DRIVE or use heavy machinery until tomorrow (because of the sedation medicines used during the test).    FOLLOW UP: Our staff will call the number listed on your records the next business day following your procedure to check on you and address any questions or concerns that you may have regarding the information given to you following your procedure. If we do not reach you, we will leave a message.  However, if you are feeling well and you are not experiencing any problems, there is no need to return our call.  We will assume that you have returned to your regular daily activities without incident.  If any biopsies were taken you will be contacted by phone or by letter within the next 1-3 weeks.  Please call us at [phone] if you have not heard about the biopsies in 3 weeks.    SIGNATURES/CONFIDENTIALITY: You and/or your care partner have signed paperwork which will be entered into your electronic medical record.  These signatures attest to the fact that that the information above on your After Visit Summary has been reviewed and is understood.  Full responsibility of the confidentiality of this discharge information lies with you and/or your care-partner.

## 2018-06-08 NOTE — Progress Notes (Signed)
Called to room to assist during endoscopic procedure.  Patient ID and intended procedure confirmed with present staff. Received instructions for my participation in the procedure from the performing physician.  

## 2018-06-09 ENCOUNTER — Telehealth: Payer: Self-pay

## 2018-06-09 NOTE — Telephone Encounter (Signed)
  Follow up Call-  Call back number 06/08/2018  Post procedure Call Back phone  # [phone]  Permission to leave phone message Yes  Some recent data might be hidden     Patient questions:  Do you have a fever, pain , or abdominal swelling? No. Pain Score  0 *  Have you tolerated food without any problems? Yes.    Have you been able to return to your normal activities? Yes.    Do you have any questions about your discharge instructions: Diet   No. Medications  No. Follow up visit  No.  Do you have questions or concerns about your Care? No.  Actions: * If pain score is 4 or above: No action needed, pain <4.

## 2018-06-19 DIAGNOSIS — F411 Generalized anxiety disorder: Secondary | ICD-10-CM | POA: Diagnosis not present

## 2018-07-05 DIAGNOSIS — H2511 Age-related nuclear cataract, right eye: Secondary | ICD-10-CM | POA: Diagnosis not present

## 2018-07-05 DIAGNOSIS — H26492 Other secondary cataract, left eye: Secondary | ICD-10-CM | POA: Diagnosis not present

## 2018-07-05 DIAGNOSIS — H52203 Unspecified astigmatism, bilateral: Secondary | ICD-10-CM | POA: Diagnosis not present

## 2018-07-06 DIAGNOSIS — F411 Generalized anxiety disorder: Secondary | ICD-10-CM | POA: Diagnosis not present

## 2018-07-13 DIAGNOSIS — C44729 Squamous cell carcinoma of skin of left lower limb, including hip: Secondary | ICD-10-CM | POA: Diagnosis not present

## 2018-07-13 DIAGNOSIS — H61001 Unspecified perichondritis of right external ear: Secondary | ICD-10-CM | POA: Diagnosis not present

## 2018-08-14 DIAGNOSIS — R7301 Impaired fasting glucose: Secondary | ICD-10-CM | POA: Diagnosis not present

## 2018-08-14 DIAGNOSIS — I10 Essential (primary) hypertension: Secondary | ICD-10-CM | POA: Diagnosis not present

## 2018-08-14 DIAGNOSIS — E038 Other specified hypothyroidism: Secondary | ICD-10-CM | POA: Diagnosis not present

## 2018-08-14 DIAGNOSIS — R82998 Other abnormal findings in urine: Secondary | ICD-10-CM | POA: Diagnosis not present

## 2018-08-14 DIAGNOSIS — E7849 Other hyperlipidemia: Secondary | ICD-10-CM | POA: Diagnosis not present

## 2018-08-14 DIAGNOSIS — M859 Disorder of bone density and structure, unspecified: Secondary | ICD-10-CM | POA: Diagnosis not present

## 2018-08-15 DIAGNOSIS — F411 Generalized anxiety disorder: Secondary | ICD-10-CM | POA: Diagnosis not present

## 2018-08-21 DIAGNOSIS — I10 Essential (primary) hypertension: Secondary | ICD-10-CM | POA: Diagnosis not present

## 2018-08-21 DIAGNOSIS — L988 Other specified disorders of the skin and subcutaneous tissue: Secondary | ICD-10-CM | POA: Diagnosis not present

## 2018-08-21 DIAGNOSIS — R748 Abnormal levels of other serum enzymes: Secondary | ICD-10-CM | POA: Diagnosis not present

## 2018-08-21 DIAGNOSIS — E7849 Other hyperlipidemia: Secondary | ICD-10-CM | POA: Diagnosis not present

## 2018-08-21 DIAGNOSIS — K222 Esophageal obstruction: Secondary | ICD-10-CM | POA: Diagnosis not present

## 2018-08-21 DIAGNOSIS — E210 Primary hyperparathyroidism: Secondary | ICD-10-CM | POA: Diagnosis not present

## 2018-08-21 DIAGNOSIS — Z6827 Body mass index (BMI) 27.0-27.9, adult: Secondary | ICD-10-CM | POA: Diagnosis not present

## 2018-08-21 DIAGNOSIS — Z1389 Encounter for screening for other disorder: Secondary | ICD-10-CM | POA: Diagnosis not present

## 2018-08-21 DIAGNOSIS — Z0000 Encounter for general adult medical examination without abnormal findings: Secondary | ICD-10-CM | POA: Diagnosis not present

## 2018-08-21 DIAGNOSIS — K219 Gastro-esophageal reflux disease without esophagitis: Secondary | ICD-10-CM | POA: Diagnosis not present

## 2018-08-21 DIAGNOSIS — N281 Cyst of kidney, acquired: Secondary | ICD-10-CM | POA: Diagnosis not present

## 2018-08-21 DIAGNOSIS — D649 Anemia, unspecified: Secondary | ICD-10-CM | POA: Diagnosis not present

## 2018-08-23 DIAGNOSIS — Z1212 Encounter for screening for malignant neoplasm of rectum: Secondary | ICD-10-CM | POA: Diagnosis not present

## 2018-08-23 LAB — IFOBT (OCCULT BLOOD): IMMUNOLOGICAL FECAL OCCULT BLOOD TEST: POSITIVE

## 2018-08-25 DIAGNOSIS — D485 Neoplasm of uncertain behavior of skin: Secondary | ICD-10-CM | POA: Diagnosis not present

## 2018-08-25 DIAGNOSIS — Z124 Encounter for screening for malignant neoplasm of cervix: Secondary | ICD-10-CM | POA: Diagnosis not present

## 2018-08-25 DIAGNOSIS — C44729 Squamous cell carcinoma of skin of left lower limb, including hip: Secondary | ICD-10-CM | POA: Diagnosis not present

## 2018-08-25 DIAGNOSIS — L900 Lichen sclerosus et atrophicus: Secondary | ICD-10-CM | POA: Diagnosis not present

## 2018-08-25 DIAGNOSIS — Z01419 Encounter for gynecological examination (general) (routine) without abnormal findings: Secondary | ICD-10-CM | POA: Diagnosis not present

## 2018-08-30 DIAGNOSIS — F411 Generalized anxiety disorder: Secondary | ICD-10-CM | POA: Diagnosis not present

## 2018-09-04 DIAGNOSIS — F411 Generalized anxiety disorder: Secondary | ICD-10-CM | POA: Diagnosis not present

## 2018-09-28 DIAGNOSIS — F411 Generalized anxiety disorder: Secondary | ICD-10-CM | POA: Diagnosis not present

## 2018-10-12 DIAGNOSIS — L821 Other seborrheic keratosis: Secondary | ICD-10-CM | POA: Diagnosis not present

## 2018-10-12 DIAGNOSIS — D229 Melanocytic nevi, unspecified: Secondary | ICD-10-CM | POA: Diagnosis not present

## 2018-10-12 DIAGNOSIS — L570 Actinic keratosis: Secondary | ICD-10-CM | POA: Diagnosis not present

## 2018-10-12 DIAGNOSIS — Z85828 Personal history of other malignant neoplasm of skin: Secondary | ICD-10-CM | POA: Diagnosis not present

## 2018-10-12 DIAGNOSIS — L814 Other melanin hyperpigmentation: Secondary | ICD-10-CM | POA: Diagnosis not present

## 2018-10-17 DIAGNOSIS — F411 Generalized anxiety disorder: Secondary | ICD-10-CM | POA: Diagnosis not present

## 2018-11-30 ENCOUNTER — Encounter: Payer: Self-pay | Admitting: Internal Medicine

## 2018-11-30 DIAGNOSIS — D649 Anemia, unspecified: Secondary | ICD-10-CM | POA: Diagnosis not present

## 2018-11-30 DIAGNOSIS — D6489 Other specified anemias: Secondary | ICD-10-CM | POA: Diagnosis not present

## 2018-11-30 DIAGNOSIS — E559 Vitamin D deficiency, unspecified: Secondary | ICD-10-CM | POA: Diagnosis not present

## 2018-12-06 ENCOUNTER — Encounter: Payer: Self-pay | Admitting: Internal Medicine

## 2018-12-13 ENCOUNTER — Encounter: Payer: Self-pay | Admitting: General Surgery

## 2018-12-14 ENCOUNTER — Ambulatory Visit (INDEPENDENT_AMBULATORY_CARE_PROVIDER_SITE_OTHER): Payer: Medicare Other | Admitting: Internal Medicine

## 2018-12-14 ENCOUNTER — Encounter: Payer: Self-pay | Admitting: Internal Medicine

## 2018-12-14 ENCOUNTER — Other Ambulatory Visit: Payer: Self-pay

## 2018-12-14 VITALS — Ht 66.5 in | Wt 168.0 lb

## 2018-12-14 DIAGNOSIS — D649 Anemia, unspecified: Secondary | ICD-10-CM | POA: Diagnosis not present

## 2018-12-14 DIAGNOSIS — R195 Other fecal abnormalities: Secondary | ICD-10-CM | POA: Diagnosis not present

## 2018-12-14 DIAGNOSIS — K222 Esophageal obstruction: Secondary | ICD-10-CM

## 2018-12-14 DIAGNOSIS — K219 Gastro-esophageal reflux disease without esophagitis: Secondary | ICD-10-CM

## 2018-12-14 MED ORDER — PANTOPRAZOLE SODIUM 40 MG PO TBEC: 40.0000 mg | DELAYED_RELEASE_TABLET | Freq: Every day | ORAL | 11 refills | Status: DC

## 2018-12-14 NOTE — Progress Notes (Signed)
HISTORY OF PRESENT ILLNESS:  Janet Mccarthy is a 84 y.o. female with GERD complicated by peptic stricture requiring esophageal dilation who presents today regarding follow-up post endoscopy as well as at the encouragement of her primary care provider regarding anemia and Hemoccult-positive stool.  Patient has a history of complicated diverticulitis for which she is status post segmental resection remotely.  No colonoscopy in 10 years.  I last saw the patient November seventh 2019 when she underwent upper endoscopy for reflux, dysphasia, and nausea with vomiting.  She was found to have a distal esophageal stricture and a hiatal hernia.  Her symptoms were felt secondary to poorly controlled reflux.  She was dilated with 54 [place] Maloney dilator.  She was prescribed pantoprazole 40 mg daily and told to take this medication every day, despite her reluctance.  Patient is pleased to report that since that time she has had no further problems with nausea or vomiting.  As well, her dysphasia has markedly improved post dilation.  She does mention to very mild episodes of transient solid food dysphasia.  She is tolerating medication well without concerns or appreciable side effects.  Next, I have received outside records from her primary care provider.  Review of blood work from November 30, 2018 shows mild normocytic anemia with hemoglobin 11.2 and MCV 94.8.  B12 and iron studies were normal.  Hemoccult studies returned positive for occult blood.  Patient denies abdominal pain or change in bowel habits from baseline.  She does notice some intermittent rectal bleeding which she has attributed to hemorrhoids.  She has been offered colonoscopy previously at the appropriate time but declined.  She remains somewhat reluctant  REVIEW OF SYSTEMS:  All non-GI ROS negative unless otherwise stated in the HPI except for arthritis, depression  Past Medical History:  Diagnosis Date  . Arthritis    right hip  . Depression    . Diverticular disease    sigmoid  . Diverticulitis   . Diverticulosis   . Gallstones   . GERD (gastroesophageal reflux disease)   . Hyperlipidemia   . Hyperparathyroidism ([place])   . Hypothyroidism   . IFG (impaired fasting glucose)   . Multiple lipomas   . Osteoarthritis   . Osteopenia   . Retroperitoneal mass   . Skin cancer    nose  . Vitamin D deficiency     Past Surgical History:  Procedure Laterality Date  . COLON RESECTION    . COLONOSCOPY WITH ESOPHAGOGASTRODUODENOSCOPY (EGD)  06/2018  . TONSILLECTOMY AND ADENOIDECTOMY    . TOTAL HIP ARTHROPLASTY  2004   right  . tumor removal     benign retriperitoneal    Social History ZAHRAA BHARGAVA  reports that she quit smoking about 47 years ago. She has never used smokeless tobacco. She reports that she does not drink alcohol or use drugs.  family history is not on file. She was adopted.  Allergies  Allergen Reactions  . Sulfa Antibiotics        PHYSICAL EXAMINATION: No physical examination with telehealth visit  ASSESSMENT:  1.  GERD.  This was the cause for her chronic nausea with vomiting.  Improved on pantoprazole 40 mg daily 2.  Esophageal stricture.  Status post dilation.  Improved dysphasia. 3.  Normocytic anemia.  Mild 4.  Hemoccult-positive stool.  Etiology uncertain.  Clinical significance uncertain.  Has not had colonoscopy years 5.  History of complicated diverticulitis status post segmental colonic resection 6.  General medical problems.  Mild  and stable   PLAN:  1.  Continue pantoprazole 40 mg daily.  Medication effects and risks reviewed 2.  PRESCRIBE PANTOPRAZOLE 40 mg daily; #30; 11 refills 3.  Reflux precautions 4.  Strictly advised to contact this office should she have any significant issues with dysphasia as she will require repeat esophageal dilation 5.  For Hemoccult positive stool I have recommended colonoscopy.  I discussed with her the pros and cons of colonoscopy.  She  understands these quite well (principally procedure related complications versus missed neoplasia).  I told the patient that we will contact her after I reviewed her outside laboratories and studies (which I have done).  The ultimate choice with the procedure will be hers.The nature of the procedure, as well as the risks, benefits, and alternatives were carefully and thoroughly reviewed with the patient. Ample time for discussion and questions allowed. The patient understood, was satisfied, and agreed to proceed. 6.  Ongoing general medical care with Dr. Joylene Draft

## 2018-12-14 NOTE — Patient Instructions (Addendum)
If you are age 82 or older, your body mass index should be between 23-30. Your Body mass index is 26.71 kg/m. If this is out of the aforementioned range listed, please consider follow up with your Primary Care Provider.  If you are age 81 or younger, your body mass index should be between 19-25. Your Body mass index is 26.71 kg/m. If this is out of the aformentioned range listed, please consider follow up with your Primary Care Provider.   We have sent the following medications to your pharmacy for you to pick up at your convenience: Pantoprazole  Reflux precautions (see attached)  Contact this office should you have any significant issues with dysphasia as you will require repeat esophageal dilation.  For Hemoccult positive stool, I have recommended colonoscopy. (Outside records were reviewed).  Thank you for choosing me and [facility].  Scarlette Shorts, MD \  Gastroesophageal Reflux Disease, Adult Gastroesophageal reflux (GER) happens when acid from the stomach flows up into the tube that connects the mouth and the stomach (esophagus). Normally, food travels down the esophagus and stays in the stomach to be digested. However, when a person has GER, food and stomach acid sometimes move back up into the esophagus. If this becomes a more serious problem, the person may be diagnosed with a disease called gastroesophageal reflux disease (GERD). GERD occurs when the reflux:  Happens often.  Causes frequent or severe symptoms.  Causes problems such as damage to the esophagus. When stomach acid comes in contact with the esophagus, the acid may cause soreness (inflammation) in the esophagus. Over time, GERD may create small holes (ulcers) in the lining of the esophagus. What are the causes? This condition is caused by a problem with the muscle between the esophagus and the stomach (lower esophageal sphincter, or LES). Normally, the LES muscle closes after food passes through the  esophagus to the stomach. When the LES is weakened or abnormal, it does not close properly, and that allows food and stomach acid to go back up into the esophagus. The LES can be weakened by certain dietary substances, medicines, and medical conditions, including:  Tobacco use.  Pregnancy.  Having a hiatal hernia.  Alcohol use.  Certain foods and beverages, such as coffee, chocolate, onions, and peppermint. What increases the risk? You are more likely to develop this condition if you:  Have an increased body weight.  Have a connective tissue disorder.  Use NSAID medicines. What are the signs or symptoms? Symptoms of this condition include:  Heartburn.  Difficult or painful swallowing.  The feeling of having a lump in the throat.  Abitter taste in the mouth.  Bad breath.  Having a large amount of saliva.  Having an upset or bloated stomach.  Belching.  Chest pain. Different conditions can cause chest pain. Make sure you see your health care provider if you experience chest pain.  Shortness of breath or wheezing.  Ongoing (chronic) cough or a night-time cough.  Wearing away of tooth enamel.  Weight loss. How is this diagnosed? Your health care provider will take a medical history and perform a physical exam. To determine if you have mild or severe GERD, your health care provider may also monitor how you respond to treatment. You may also have tests, including:  A test to examine your stomach and esophagus with a small camera (endoscopy).  A test thatmeasures the acidity level in your esophagus.  A test thatmeasures how much pressure is on your esophagus.  A  barium swallow or modified barium swallow test to show the shape, size, and functioning of your esophagus. How is this treated? The goal of treatment is to help relieve your symptoms and to prevent complications. Treatment for this condition may vary depending on how severe your symptoms are. Your health  care provider may recommend:  Changes to your diet.  Medicine.  Surgery. Follow these instructions at home: Eating and drinking   Follow a diet as recommended by your health care provider. This may involve avoiding foods and drinks such as: ? Coffee and tea (with or without caffeine). ? Drinks that containalcohol. ? Energy drinks and sports drinks. ? Carbonated drinks or sodas. ? Chocolate and cocoa. ? Peppermint and mint flavorings. ? Garlic and onions. ? Horseradish. ? Spicy and acidic foods, including peppers, chili powder, curry powder, vinegar, hot sauces, and barbecue sauce. ? Citrus fruit juices and citrus fruits, such as oranges, lemons, and limes. ? Tomato-based foods, such as red sauce, chili, salsa, and pizza with red sauce. ? Fried and fatty foods, such as donuts, french fries, potato chips, and high-fat dressings. ? High-fat meats, such as hot dogs and fatty cuts of red and white meats, such as rib eye steak, sausage, ham, and bacon. ? High-fat dairy items, such as whole milk, butter, and cream cheese.  Eat small, frequent meals instead of large meals.  Avoid drinking large amounts of liquid with your meals.  Avoid eating meals during the 2-3 hours before bedtime.  Avoid lying down right after you eat.  Do not exercise right after you eat. Lifestyle   Do not use any products that contain nicotine or tobacco, such as cigarettes, e-cigarettes, and chewing tobacco. If you need help quitting, ask your health care provider.  Try to reduce your stress by using methods such as yoga or meditation. If you need help reducing stress, ask your health care provider.  If you are overweight, reduce your weight to an amount that is healthy for you. Ask your health care provider for guidance about a safe weight loss goal. General instructions  Pay attention to any changes in your symptoms.  Take over-the-counter and prescription medicines only as told by your health care  provider. Do not take aspirin, ibuprofen, or other NSAIDs unless your health care provider told you to do so.  Wear loose-fitting clothing. Do not wear anything tight around your waist that causes pressure on your abdomen.  Raise (elevate) the head of your bed about 6 inches (15 cm).  Avoid bending over if this makes your symptoms worse.  Keep all follow-up visits as told by your health care provider. This is important. Contact a health care provider if:  You have: ? New symptoms. ? Unexplained weight loss. ? Difficulty swallowing or it hurts to swallow. ? Wheezing or a persistent cough. ? A hoarse voice.  Your symptoms do not improve with treatment. Get help right away if you:  Have pain in your arms, neck, jaw, teeth, or back.  Feel sweaty, dizzy, or light-headed.  Have chest pain or shortness of breath.  Vomit and your vomit looks like blood or coffee grounds.  Faint.  Have stool that is bloody or black.  Cannot swallow, drink, or eat. Summary  Gastroesophageal reflux happens when acid from the stomach flows up into the esophagus. GERD is a disease in which the reflux happens often, causes frequent or severe symptoms, or causes problems such as damage to the esophagus.  Treatment for this condition may  vary depending on how severe your symptoms are. Your health care provider may recommend diet and lifestyle changes, medicine, or surgery.  Contact a health care provider if you have new or worsening symptoms.  Take over-the-counter and prescription medicines only as told by your health care provider. Do not take aspirin, ibuprofen, or other NSAIDs unless your health care provider told you to do so.  Keep all follow-up visits as told by your health care provider. This is important. This information is not intended to replace advice given to you by your health care provider. Make sure you discuss any questions you have with your health care provider. Document Released:  04/28/2005 Document Revised: 01/25/2018 Document Reviewed: 01/25/2018 Elsevier Interactive Patient Education  2019 [employer].

## 2018-12-14 NOTE — Addendum Note (Signed)
Addended by: Candie Mile on: 12/14/2018 04:28 PM   Modules accepted: Orders

## 2019-02-08 IMAGING — US US ABDOMEN COMPLETE
1 series · 14 of 25 positions shown · non-contrast
Comparison: None.

CLINICAL DATA: Abdominal pain.  GERD.  Gallstones.

EXAM:
ABDOMEN ULTRASOUND COMPLETE

[Series 1: us abdomen complete · 14 of 82 slices shown]
[im 1/82]
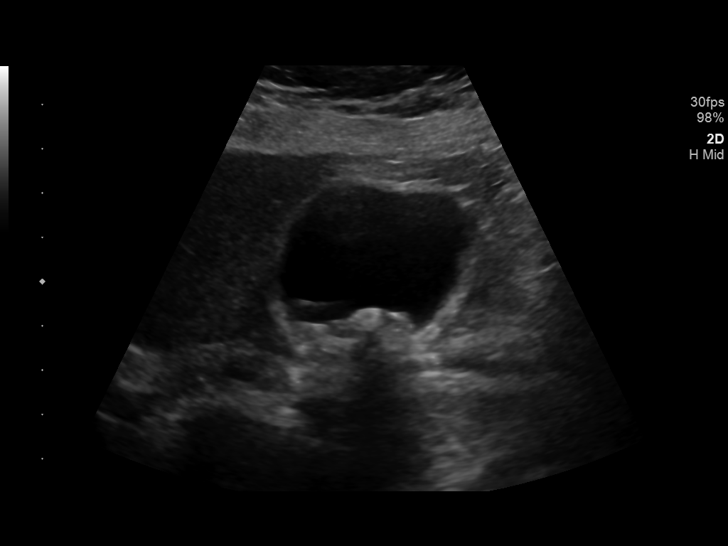
[im 7/82]
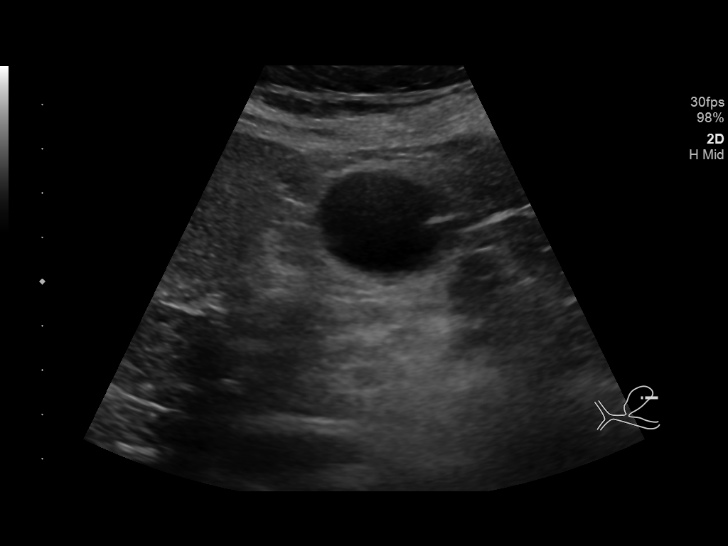
[im 14/82]
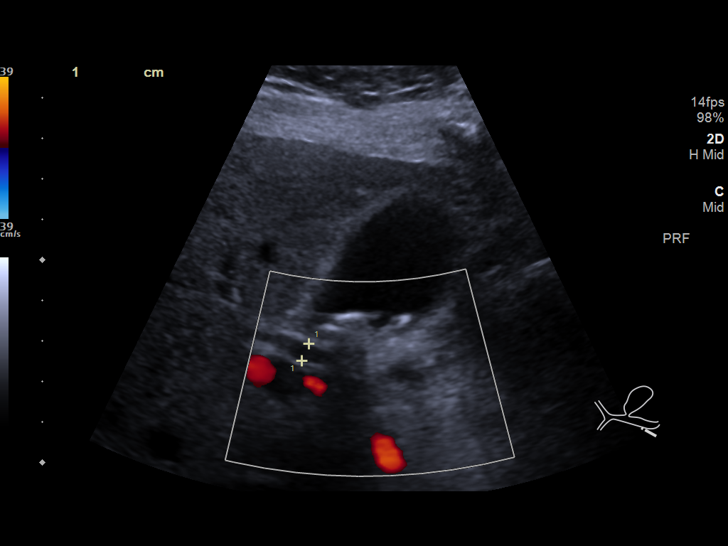
[im 21/82]
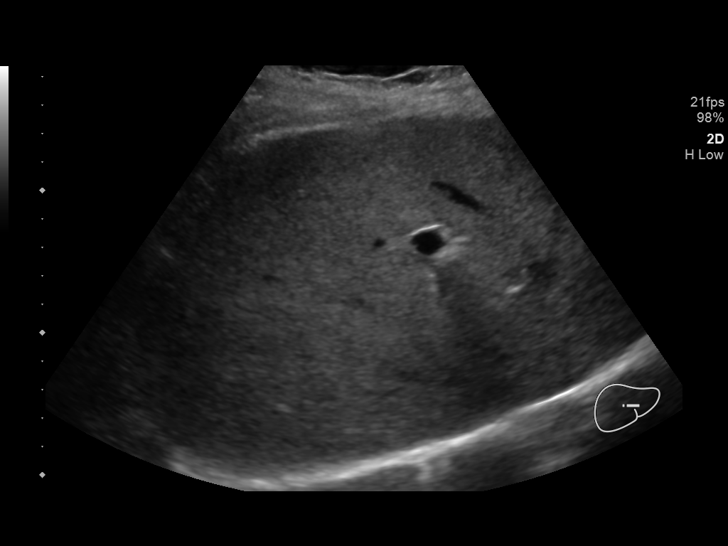
[im 28/82]
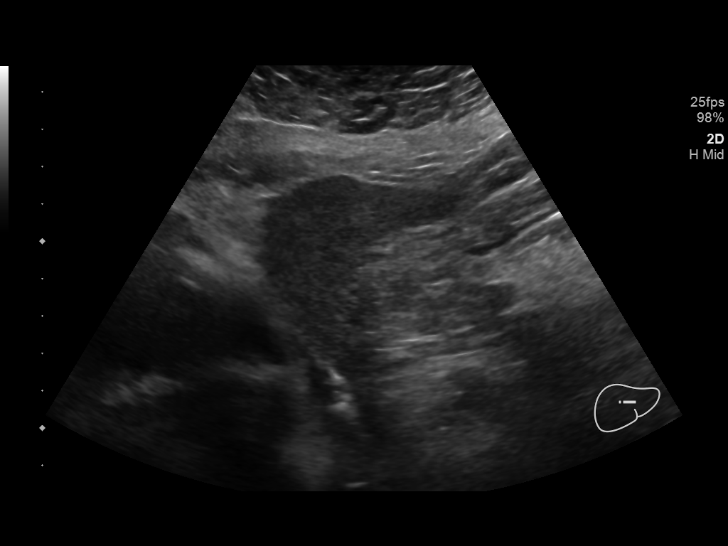
[im 31/82]
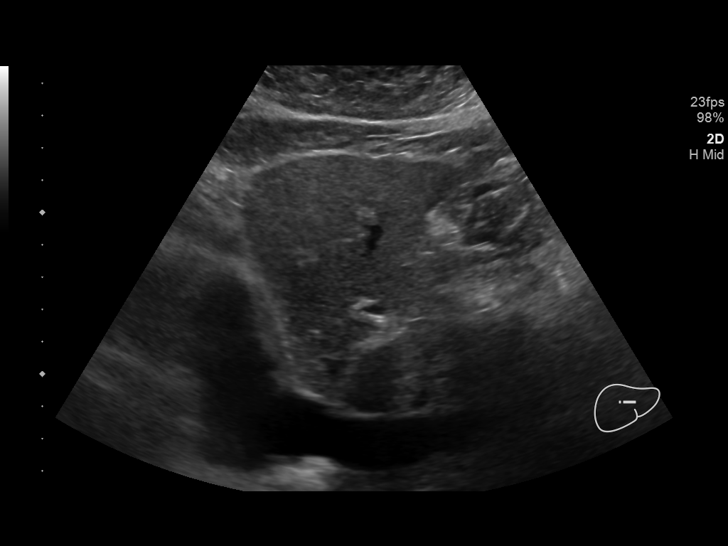
[im 38/82]
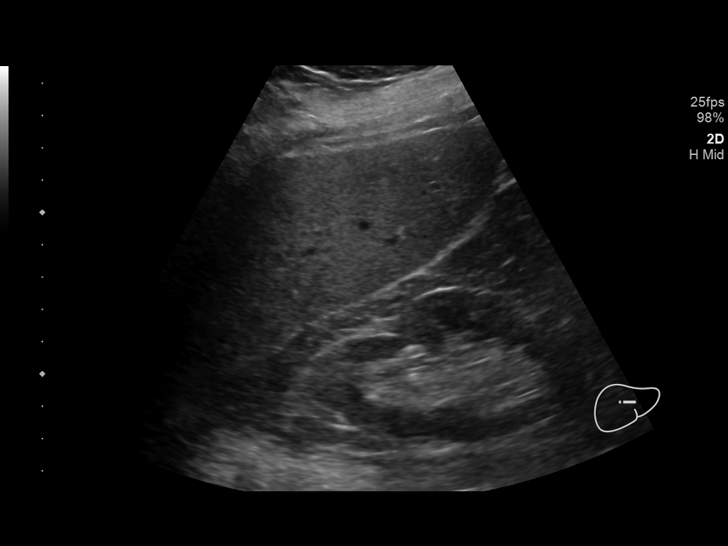
[im 44/82]
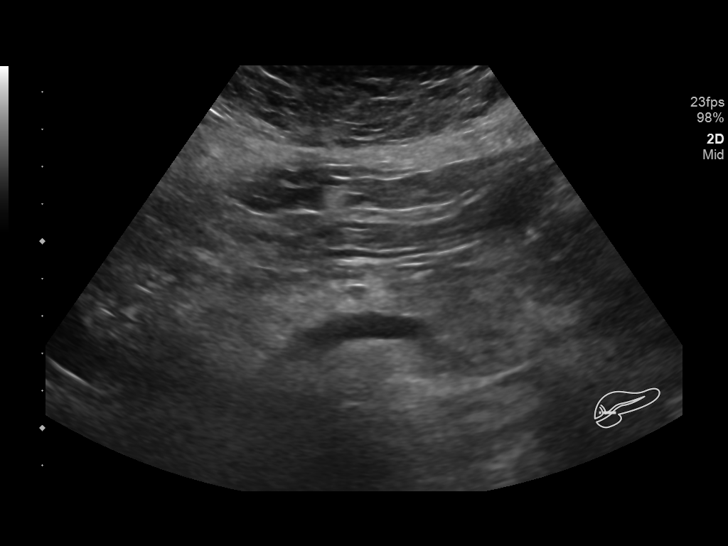
[im 51/82]
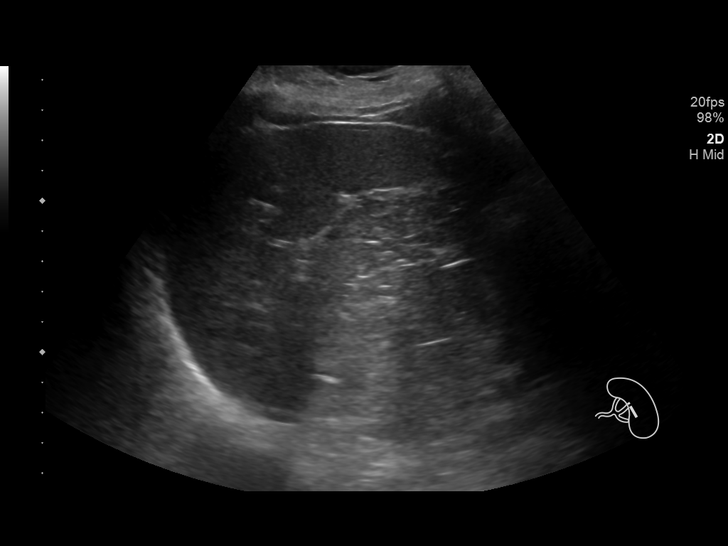
[im 55/82]
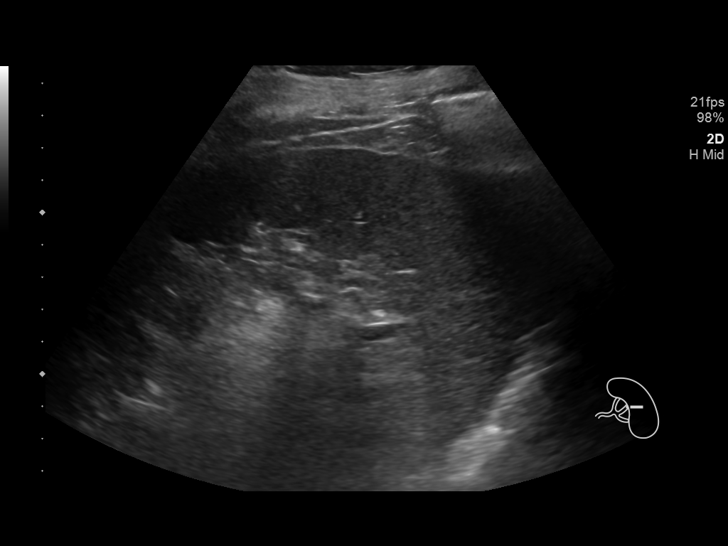
[im 61/82]
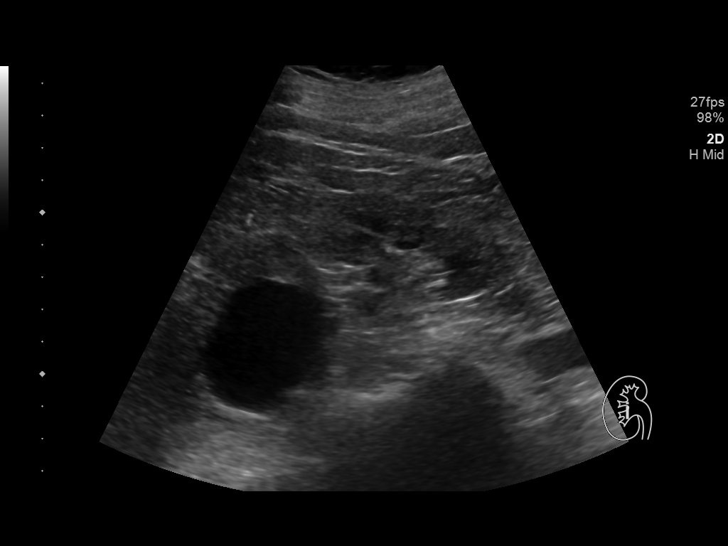
[im 68/82]
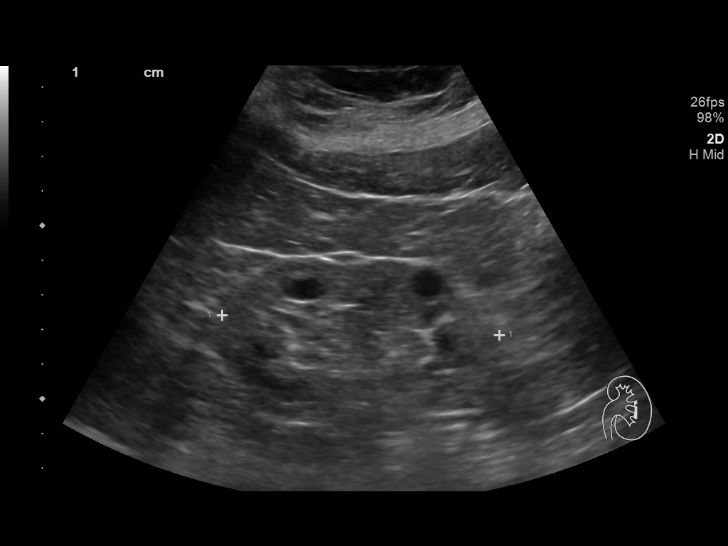
[im 75/82]
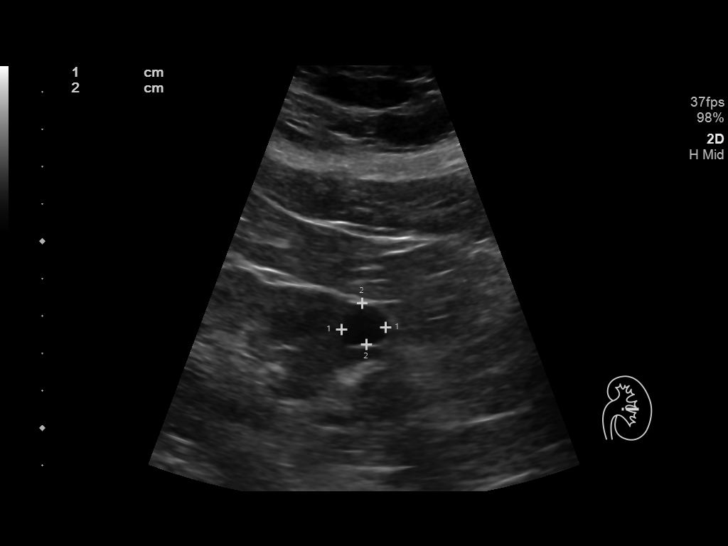
[im 82/82]
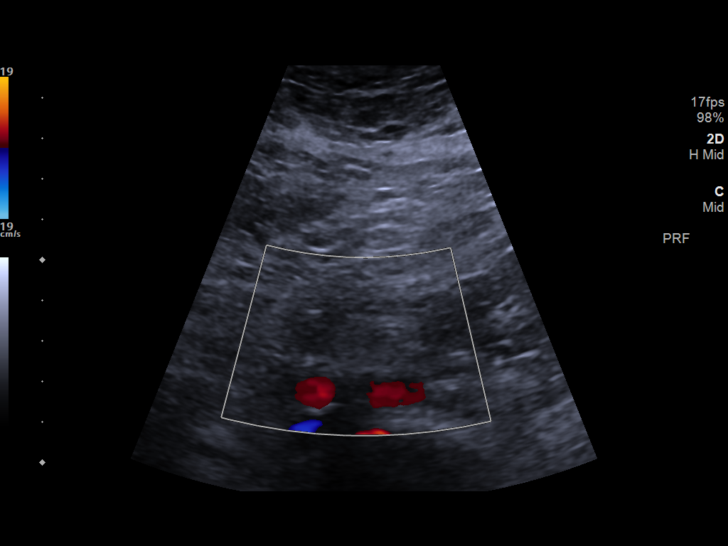

[14 of 25 positions shown; findings below may reference images not displayed]

FINDINGS: Gallbladder: No gallstones or wall thickening visualized. Layering
gallstones, the largest measuring 1.5 cm. No sonographic Murphy sign
noted by sonographer.

Common bile duct: Diameter: 4.6 mm

Liver: No focal lesion identified. Within normal limits in
parenchymal echogenicity. Portal vein is patent on color Doppler
imaging with normal direction of blood flow towards the liver.

IVC: No abnormality visualized.

Pancreas: Visualized portion unremarkable.

Spleen: Size and appearance within normal limits.

Right Kidney: Length: 10.9 cm. Echogenicity within normal limits. No
mass or hydronephrosis visualized. Benign-appearing 4.2 cm cyst in
the midpole region of the right kidney.

Left Kidney: Length: 8.0 cm. Echogenicity within normal limits. No
mass or hydronephrosis visualized. Two benign-appearing cysts,
measuring 1.2 cm each.

Abdominal aorta: No aneurysm visualized.

Other findings: None.
IMPRESSION: Cholelithiasis without evidence of acute cholecystitis.

Bilateral benign-appearing renal cysts.

## 2019-04-05 DIAGNOSIS — Z23 Encounter for immunization: Secondary | ICD-10-CM | POA: Diagnosis not present

## 2019-05-16 DIAGNOSIS — L03211 Cellulitis of face: Secondary | ICD-10-CM | POA: Diagnosis not present

## 2019-05-16 DIAGNOSIS — G510 Bell's palsy: Secondary | ICD-10-CM | POA: Diagnosis not present

## 2019-05-17 DIAGNOSIS — L03211 Cellulitis of face: Secondary | ICD-10-CM | POA: Diagnosis not present

## 2019-05-17 DIAGNOSIS — G510 Bell's palsy: Secondary | ICD-10-CM | POA: Diagnosis not present

## 2019-08-22 ENCOUNTER — Ambulatory Visit: Payer: Medicare Other | Attending: Internal Medicine

## 2019-08-22 DIAGNOSIS — Z23 Encounter for immunization: Secondary | ICD-10-CM | POA: Insufficient documentation

## 2019-09-12 ENCOUNTER — Ambulatory Visit: Payer: Medicare Other | Attending: Internal Medicine

## 2019-09-12 DIAGNOSIS — Z23 Encounter for immunization: Secondary | ICD-10-CM | POA: Insufficient documentation

## 2019-09-12 NOTE — Progress Notes (Signed)
   Covid-19 Vaccination Clinic  Name:  Janet Mccarthy    MRN: [id number] DOB: 1935-01-16  09/12/2019  [name] was observed post Covid-19 immunization for 15 minutes without incidence. She was provided with Vaccine Information Sheet and instruction to access the V-Safe system.   [name] was instructed to call 911 with any severe reactions post vaccine: Marland Kitchen Difficulty breathing  . Swelling of your face and throat  . A fast heartbeat  . A bad rash all over your body  . Dizziness and weakness    Immunizations Administered    Name Date Dose VIS Date Route   Pfizer COVID-19 Vaccine 09/12/2019 12:05 PM 0.3 mL 07/13/2019 Intramuscular   Manufacturer: [place]   Lot: [id number]   [place]: [id number]

## 2019-09-25 DIAGNOSIS — R7301 Impaired fasting glucose: Secondary | ICD-10-CM | POA: Diagnosis not present

## 2019-09-25 DIAGNOSIS — E038 Other specified hypothyroidism: Secondary | ICD-10-CM | POA: Diagnosis not present

## 2019-09-25 DIAGNOSIS — M859 Disorder of bone density and structure, unspecified: Secondary | ICD-10-CM | POA: Diagnosis not present

## 2019-09-25 DIAGNOSIS — R82998 Other abnormal findings in urine: Secondary | ICD-10-CM | POA: Diagnosis not present

## 2019-09-25 DIAGNOSIS — E7849 Other hyperlipidemia: Secondary | ICD-10-CM | POA: Diagnosis not present

## 2019-09-27 DIAGNOSIS — Z79899 Other long term (current) drug therapy: Secondary | ICD-10-CM | POA: Diagnosis not present

## 2019-09-27 DIAGNOSIS — N1831 Chronic kidney disease, stage 3a: Secondary | ICD-10-CM | POA: Diagnosis not present

## 2019-09-27 DIAGNOSIS — N281 Cyst of kidney, acquired: Secondary | ICD-10-CM | POA: Diagnosis not present

## 2019-09-27 DIAGNOSIS — Z0000 Encounter for general adult medical examination without abnormal findings: Secondary | ICD-10-CM | POA: Diagnosis not present

## 2019-09-27 DIAGNOSIS — D649 Anemia, unspecified: Secondary | ICD-10-CM | POA: Diagnosis not present

## 2019-09-27 DIAGNOSIS — G510 Bell's palsy: Secondary | ICD-10-CM | POA: Diagnosis not present

## 2019-09-27 DIAGNOSIS — J449 Chronic obstructive pulmonary disease, unspecified: Secondary | ICD-10-CM | POA: Diagnosis not present

## 2019-09-27 DIAGNOSIS — K219 Gastro-esophageal reflux disease without esophagitis: Secondary | ICD-10-CM | POA: Diagnosis not present

## 2019-09-27 DIAGNOSIS — E785 Hyperlipidemia, unspecified: Secondary | ICD-10-CM | POA: Diagnosis not present

## 2019-09-27 DIAGNOSIS — Z1331 Encounter for screening for depression: Secondary | ICD-10-CM | POA: Diagnosis not present

## 2019-09-27 DIAGNOSIS — R609 Edema, unspecified: Secondary | ICD-10-CM | POA: Diagnosis not present

## 2019-09-27 DIAGNOSIS — R748 Abnormal levels of other serum enzymes: Secondary | ICD-10-CM | POA: Diagnosis not present

## 2019-10-01 DIAGNOSIS — Z1231 Encounter for screening mammogram for malignant neoplasm of breast: Secondary | ICD-10-CM | POA: Diagnosis not present

## 2019-11-12 DIAGNOSIS — L570 Actinic keratosis: Secondary | ICD-10-CM | POA: Diagnosis not present

## 2019-11-12 DIAGNOSIS — L578 Other skin changes due to chronic exposure to nonionizing radiation: Secondary | ICD-10-CM | POA: Diagnosis not present

## 2019-11-30 DIAGNOSIS — L900 Lichen sclerosus et atrophicus: Secondary | ICD-10-CM | POA: Diagnosis not present

## 2019-11-30 DIAGNOSIS — Z124 Encounter for screening for malignant neoplasm of cervix: Secondary | ICD-10-CM | POA: Diagnosis not present

## 2020-01-13 ENCOUNTER — Other Ambulatory Visit: Payer: Self-pay | Admitting: Internal Medicine

## 2020-04-25 DIAGNOSIS — Z23 Encounter for immunization: Secondary | ICD-10-CM | POA: Diagnosis not present

## 2020-05-24 DIAGNOSIS — Z23 Encounter for immunization: Secondary | ICD-10-CM | POA: Diagnosis not present

## 2020-06-18 DIAGNOSIS — D485 Neoplasm of uncertain behavior of skin: Secondary | ICD-10-CM | POA: Diagnosis not present

## 2020-06-18 DIAGNOSIS — D0439 Carcinoma in situ of skin of other parts of face: Secondary | ICD-10-CM | POA: Diagnosis not present

## 2020-06-18 DIAGNOSIS — C44329 Squamous cell carcinoma of skin of other parts of face: Secondary | ICD-10-CM | POA: Diagnosis not present

## 2020-06-18 DIAGNOSIS — H61001 Unspecified perichondritis of right external ear: Secondary | ICD-10-CM | POA: Diagnosis not present

## 2020-06-18 DIAGNOSIS — L570 Actinic keratosis: Secondary | ICD-10-CM | POA: Diagnosis not present

## 2020-06-23 DIAGNOSIS — L570 Actinic keratosis: Secondary | ICD-10-CM | POA: Diagnosis not present

## 2020-06-23 DIAGNOSIS — L821 Other seborrheic keratosis: Secondary | ICD-10-CM | POA: Diagnosis not present

## 2020-06-23 DIAGNOSIS — L814 Other melanin hyperpigmentation: Secondary | ICD-10-CM | POA: Diagnosis not present

## 2020-06-23 DIAGNOSIS — D1801 Hemangioma of skin and subcutaneous tissue: Secondary | ICD-10-CM | POA: Diagnosis not present

## 2020-07-11 DIAGNOSIS — H2511 Age-related nuclear cataract, right eye: Secondary | ICD-10-CM | POA: Diagnosis not present

## 2020-07-11 DIAGNOSIS — H524 Presbyopia: Secondary | ICD-10-CM | POA: Diagnosis not present

## 2020-07-11 DIAGNOSIS — H26492 Other secondary cataract, left eye: Secondary | ICD-10-CM | POA: Diagnosis not present

## 2020-08-06 ENCOUNTER — Other Ambulatory Visit: Payer: Self-pay | Admitting: Internal Medicine

## 2020-12-23 ENCOUNTER — Other Ambulatory Visit (HOSPITAL_BASED_OUTPATIENT_CLINIC_OR_DEPARTMENT_OTHER): Payer: Self-pay

## 2020-12-23 ENCOUNTER — Ambulatory Visit: Payer: Medicare Other | Attending: Internal Medicine

## 2020-12-23 ENCOUNTER — Other Ambulatory Visit: Payer: Self-pay

## 2020-12-23 DIAGNOSIS — Z23 Encounter for immunization: Secondary | ICD-10-CM

## 2020-12-23 MED ORDER — PFIZER-BIONT COVID-19 VAC-TRIS 30 MCG/0.3ML IM SUSP
INTRAMUSCULAR | 0 refills | Status: DC
Start: 2020-12-23 — End: 2021-04-15
  Filled 2020-12-23: qty 0.3, 1d supply, fill #0

## 2020-12-23 NOTE — Progress Notes (Signed)
   Covid-19 Vaccination Clinic  Name:  QUINN BARTLING    MRN: [id number] DOB: [birth year]  12/23/2020  Ms. Fedie was observed post Covid-19 immunization for 15 minutes without incident. She was provided with Vaccine Information Sheet and instruction to access the V-Safe system.   Ms. Maiorana was instructed to call 911 with any severe reactions post vaccine: Marland Kitchen Difficulty breathing  . Swelling of face and throat  . A fast heartbeat  . A bad rash all over body  . Dizziness and weakness   Immunizations Administered    Name Date Dose VIS Date Route   PFIZER Comrnaty(Gray TOP) Covid-19 Vaccine 12/23/2020  9:39 AM 0.3 mL 07/10/2020 Intramuscular   Manufacturer: [employer]   Lot: [id number]   [place]: [phone]

## 2021-01-30 ENCOUNTER — Other Ambulatory Visit: Payer: Self-pay | Admitting: Internal Medicine

## 2021-03-12 ENCOUNTER — Other Ambulatory Visit: Payer: Self-pay | Admitting: Orthopedic Surgery

## 2021-03-12 ENCOUNTER — Other Ambulatory Visit (HOSPITAL_COMMUNITY): Payer: Self-pay | Admitting: Orthopedic Surgery

## 2021-03-12 DIAGNOSIS — Z96641 Presence of right artificial hip joint: Secondary | ICD-10-CM

## 2021-03-23 ENCOUNTER — Ambulatory Visit (HOSPITAL_COMMUNITY)
Admission: RE | Admit: 2021-03-23 | Discharge: 2021-03-23 | Disposition: A | Discharge status: Home / Self Care | Payer: Medicare Other | Source: Ambulatory Visit | Attending: Orthopedic Surgery | Admitting: Orthopedic Surgery

## 2021-03-23 ENCOUNTER — Encounter (HOSPITAL_COMMUNITY)
Admission: RE | Admit: 2021-03-23 | Discharge: 2021-03-23 | Disposition: A | Discharge status: Home / Self Care | Payer: Medicare Other | Source: Ambulatory Visit | Attending: Orthopedic Surgery | Admitting: Orthopedic Surgery

## 2021-03-23 ENCOUNTER — Other Ambulatory Visit: Payer: Self-pay

## 2021-03-23 DIAGNOSIS — Z96641 Presence of right artificial hip joint: Secondary | ICD-10-CM | POA: Diagnosis not present

## 2021-03-23 MED ORDER — TECHNETIUM TC 99M MEDRONATE IV KIT
20.0000 | PACK | Freq: Once | INTRAVENOUS | Status: AC | PRN
  Administered 2021-03-23: 20 via INTRAVENOUS

## 2021-04-15 ENCOUNTER — Ambulatory Visit: Payer: Medicare Other | Admitting: Cardiology

## 2021-04-15 ENCOUNTER — Encounter: Payer: Self-pay | Admitting: Cardiology

## 2021-04-15 ENCOUNTER — Other Ambulatory Visit: Payer: Self-pay

## 2021-04-15 VITALS — BP 122/66 | HR 87 | Temp 95.7°F | Resp 17 | Ht 66.5 in | Wt 165.6 lb

## 2021-04-15 DIAGNOSIS — E785 Hyperlipidemia, unspecified: Secondary | ICD-10-CM

## 2021-04-15 DIAGNOSIS — R739 Hyperglycemia, unspecified: Secondary | ICD-10-CM

## 2021-04-15 DIAGNOSIS — N1831 Chronic kidney disease, stage 3a: Secondary | ICD-10-CM

## 2021-04-15 DIAGNOSIS — Z01818 Encounter for other preprocedural examination: Secondary | ICD-10-CM

## 2021-04-15 NOTE — Progress Notes (Signed)
Primary Physician/Referring:  Crist Infante, MD  Patient ID: Janet Mccarthy, female    DOB: [birth year], 86 y.o.   MRN: [id number]  Chief Complaint  Patient presents with   Pre-op Exam   New Patient (Initial Visit)   HPI:    Janet Mccarthy  is a 86 y.o. female patient with very mild hyperlipidemia, hyperglycemia, referred to me for evaluation of preoperative cardiovascular stratification, patient scheduled for left hip arthroplasty.  Patient is essentially asymptomatic except for severe pain in her left hip area and also has a stress fracture involving her right femur which is allowing it to heal by using a walker.  She was placed on hydrochlorothiazide 12.5 mg remotely after she achieved menopause for "fluid.  Otherwise she is not on any cardiac medications until lipid therapy.  She was able to walk from the parking lot all the way from the ramp to our office examination room by herself without shortness of breath or chest pain.  Past Medical History:  Diagnosis Date   Arthritis    right hip   Depression    Diverticular disease    sigmoid   Diverticulitis    Diverticulosis    Gallstones    GERD (gastroesophageal reflux disease)    Hyperlipidemia    Hyperparathyroidism (HCC)    Hypothyroidism    IFG (impaired fasting glucose)    Multiple lipomas    Osteoarthritis    Osteopenia    Retroperitoneal mass    Skin cancer    nose   Vitamin D deficiency    Past Surgical History:  Procedure Laterality Date   COLON RESECTION     COLONOSCOPY WITH ESOPHAGOGASTRODUODENOSCOPY (EGD)  06/2018   TONSILLECTOMY AND ADENOIDECTOMY     TOTAL HIP ARTHROPLASTY  2004   right   tumor removal     benign retriperitoneal   Family History  Adopted: Yes    Social History   Tobacco Use   Smoking status: Former    Packs/day: 2.00    Years: 15.00    Pack years: 30.00    Types: Cigarettes    Quit date: 08/03/1971    Years since quitting: 49.7   Smokeless tobacco: Never  Substance  Use Topics   Alcohol use: No   Marital Status: Married  ROS  Review of Systems  Cardiovascular:  Negative for chest pain, dyspnea on exertion and leg swelling.  Musculoskeletal:  Positive for arthritis (left hip).  Gastrointestinal:  Negative for melena.  Objective  Blood pressure 122/66, pulse 87, temperature (!) 95.7 F (35.4 C), temperature source Temporal, resp. rate 17, height 5' 6.5" (1.689 m), weight 165 lb 9.6 oz (75.1 kg), SpO2 100 %. Body mass index is 26.33 kg/m.  Vitals with BMI 04/15/2021 04/15/2021 12/14/2018  Height - 5' 6.5" 5' 6.5"  Weight - 165 lbs 10 oz 168 lbs  BMI - 87.56 43.32  Systolic 951 884 -  Diastolic 66 73 -  Pulse 87 95 -     Physical Exam Neck:     Vascular: No carotid bruit or JVD.  Cardiovascular:     Rate and Rhythm: Normal rate and regular rhythm.     Pulses: Intact distal pulses.     Heart sounds: Murmur heard.  Scratchy early systolic murmur is present with a grade of 1/6.    No gallop.  Pulmonary:     Effort: Pulmonary effort is normal.     Breath sounds: Normal breath sounds.  Abdominal:     General:  Bowel sounds are normal.     Palpations: Abdomen is soft.  Musculoskeletal:        General: No swelling.     Laboratory examination:   External labs:   Labs 01/06/2021:  Serum glucose 85 mg, BUN 14, creatinine 1.2, EGFR 42.8 mL, potassium 4.8, CMP otherwise normal.  Vitamin D33.9, A1c 5.3%.  TSH normal at 2.78.  Total cholesterol 205, triglycerides 140, HDL 65, LDL 112.  Non-HDL cholesterol 140.  Hb 13.0/HCT 39.9, normal indicis.  Medications and allergies   Allergies  Allergen Reactions   Sulfa Antibiotics      Medication prior to this encounter:   Outpatient Medications Prior to Visit  Medication Sig Dispense Refill   amitriptyline (ELAVIL) 50 MG tablet Take 1 tablet by mouth daily.     clobetasol cream (TEMOVATE) 0.05 %   2   hydrochlorothiazide (MICROZIDE) 12.5 MG capsule Take 12.5 mg by mouth daily.      levothyroxine (SYNTHROID) 75 MCG tablet Take 75 mcg by mouth daily.     pantoprazole (PROTONIX) 40 MG tablet TAKE 1 TABLET ONCE DAILY. 90 tablet 1   amitriptyline (ELAVIL) 50 MG tablet Take 50 mg by mouth at bedtime.     levothyroxine (SYNTHROID, LEVOTHROID) 50 MCG tablet Take 75 mcg by mouth every 7 (seven) days.     COVID-19 mRNA Vac-TriS, Pfizer, (PFIZER-BIONT COVID-19 VAC-TRIS) SUSP injection Inject into the muscle. 0.3 mL 0   Cyanocobalamin (B-12) 1000 MCG CAPS Take 1,000 mg by mouth daily.     No facility-administered medications prior to visit.     Medication list after today's encounter   Current Outpatient Medications  Medication Instructions   amitriptyline (ELAVIL) 50 MG tablet 1 tablet, Oral, Daily   clobetasol cream (TEMOVATE) 0.05 % No dose, route, or frequency recorded.   hydrochlorothiazide (MICROZIDE) 12.5 mg, Daily   levothyroxine (SYNTHROID) 75 mcg, Oral, Daily   pantoprazole (PROTONIX) 40 MG tablet TAKE 1 TABLET ONCE DAILY.    Radiology:   No results found.  Cardiac Studies:   NA  EKG:   EKG 04/15/2021: Normal sinus rhythm at rate of 89 bpm, normal axis.  No evidence of ischemia, low-voltage complexes.    Assessment     ICD-10-CM   1. Pre-op evaluation  Z01.818 EKG 12-Lead    2. Hyperglycemia  R73.9     3. Mild hyperlipidemia  E78.5     4. Stage 3a chronic kidney disease (HCC)  N18.31        Medications Discontinued During This Encounter  Medication Reason   Cyanocobalamin (B-12) 1000 MCG CAPS Error   COVID-19 mRNA Vac-TriS, Pfizer, (PFIZER-BIONT COVID-19 VAC-TRIS) SUSP injection Error   levothyroxine (SYNTHROID, LEVOTHROID) 50 MCG tablet Duplicate   amitriptyline (ELAVIL) 50 MG tablet Duplicate    No orders of the defined types were placed in this encounter.  Orders Placed This Encounter  Procedures   EKG 12-Lead   Recommendations:   Janet Mccarthy is a 86 y.o.female patient with very mild hyperlipidemia, hyperglycemia, referred to  me for evaluation of preoperative cardiovascular stratification, patient scheduled for left hip arthroplasty.  Patient is essentially asymptomatic except for severe pain in her left hip area and also has a stress fracture involving her right femur which is allowing it to heal by using a walker.  She was still able to walk from the parking lot all the way up on the ramp in her office and into the examination room without any chest pain or dyspnea.  Physical  examination is unremarkable except for a grade 1 systolic murmur in the aortic area which is essentially normal.  EKG is also unremarkable and normal except for low voltage complexes.  She has no significant cardiovascular risk factors, vascular examination is also normal.  As her metabolic equivalent of exercise is >4-5 METS, she should undergo surgery with low risk from cardiac standpoint.  I will certainly be available if she develops any perioperative cardiac complications.  I will send a note to her orthopedic surgeon Gaynelle Arabian, MD).  Reviewed her labs.  Her only risk factor so far is stage IIIa chronic kidney disease.  With regard to medication, she was placed on hydrochlorothiazide remotely after she achieved menopause for "fluid".  There is no clinical evidence of heart failure.  Blood pressures well controlled.    Adrian Prows, MD, [facility] 04/15/2021, 9:52 AM Office: [phone]

## 2021-05-15 ENCOUNTER — Other Ambulatory Visit: Payer: Self-pay

## 2021-05-15 ENCOUNTER — Ambulatory Visit: Payer: Medicare Other | Attending: Internal Medicine

## 2021-05-15 ENCOUNTER — Other Ambulatory Visit (HOSPITAL_BASED_OUTPATIENT_CLINIC_OR_DEPARTMENT_OTHER): Payer: Self-pay

## 2021-05-15 DIAGNOSIS — Z23 Encounter for immunization: Secondary | ICD-10-CM

## 2021-05-15 MED ORDER — PFIZER COVID-19 VAC BIVALENT 30 MCG/0.3ML IM SUSP
INTRAMUSCULAR | 0 refills | Status: DC
  Filled 2021-05-15: qty 0.3, 1d supply, fill #0

## 2021-05-15 NOTE — Progress Notes (Signed)
   Covid-19 Vaccination Clinic  Name:  Janet Mccarthy    MRN: [id number] DOB: 21-Apr-1935  05/15/2021  [name] was observed post Covid-19 immunization for 15 minutes without incident. She was provided with Vaccine Information Sheet and instruction to access the V-Safe system.   [name] was instructed to call [phone] with any severe reactions post vaccine: Difficulty breathing  Swelling of face and throat  A fast heartbeat  A bad rash all over body  Dizziness and weakness

## 2021-05-19 NOTE — Patient Instructions (Signed)
DUE TO COVID-19 ONLY ONE VISITOR IS ALLOWED TO COME WITH YOU AND STAY IN THE WAITING ROOM ONLY DURING PRE OP AND PROCEDURE DAY OF SURGERY IF YOU ARE GOING HOME AFTER SURGERY. IF YOU ARE SPENDING THE NIGHT 2 PEOPLE MAY VISIT WITH YOU IN YOUR PRIVATE ROOM AFTER SURGERY UNTIL VISITING  HOURS ARE OVER AT 8:00 PM AND 1 VISITOR CAN SPEND THE NIGHT.   YOU NEED TO HAVE A COVID 19 TEST ON__10/27__THIS TEST MUST BE DONE BEFORE SURGERY,  COVID TESTING SITE  IS LOCATED AT [place], [place]. REMAIN IN YOUR CAR THIS IS A DRIVE UP TEST. AFTER YOUR COVID TEST PLEASE WEAR A MASK OUT IN PUBLIC AND SOCIAL DISTANCE AND [place] YOUR HANDS FREQUENTLY, ALSO ASK ALL YOUR CLOSE CONTACT PERSONS TO WEAR A MASK AND SOCIAL DISTANCE AND [place] THEIR HANDS FREQUENTLY ALSO.               Janet Mccarthy     Your procedure is scheduled on: 06/01/21   Report to [facility] Main  Entrance   Report to admitting at   11;00 AM     Call this number if you have problems the morning of surgery [phone]    No food after midnight.    You may have clear liquid until 10:30 AM.    At 10:00 AM drink pre surgery drink.   Nothing by mouth after 10:30 AM.   CLEAR LIQUID DIET   Foods Allowed                                                                     Foods Excluded                                                                                         liquids that you cannot  Plain Jell-O any favor except red or purple                                           see through such as: Fruit ices (not with fruit pulp)                                     milk, soups, orange juice  Iced Popsicles                                    All solid food Carbonated beverages, regular and diet  Cranberry, grape and apple juices Sports drinks like Gatorade Lightly seasoned clear broth or consume(fat free) Sugar     BRUSH YOUR TEETH MORNING OF SURGERY AND RINSE YOUR MOUTH  OUT, NO CHEWING GUM CANDY OR MINTS.     Take these medicines the morning of surgery with A SIP OF WATER: Amitripyline, Synthroid, Pantoprazole                                You may not have any metal on your body including hair pins and              piercings  Do not wear jewelry, make-up, lotions, powders or perfumes, deodorant             Do not wear nail polish on your fingernails.  Do not shave  48 hours prior to surgery.                 Do not bring valuables to the hospital. [place].  Contacts, dentures or bridgework may not be worn into surgery.  Leave suitcase in the car. After surgery it may be brought to your room.                Please read over the following fact sheets you were given: _____________________________________________________________________             [facility] - Preparing for Surgery Before surgery, you can play an important role.  Because skin is not sterile, your skin needs to be as free of germs as possible.  You can reduce the number of germs on your skin by washing with CHG (chlorahexidine gluconate) soap before surgery.  CHG is an antiseptic cleaner which kills germs and bonds with the skin to continue killing germs even after washing. Please DO NOT use if you have an allergy to CHG or antibacterial soaps.  If your skin becomes reddened/irritated stop using the CHG and inform your nurse when you arrive at Short Stay. Do not shave (including legs and underarms) for at least 48 hours prior to the first CHG shower.  Please follow these instructions carefully:  1.  Shower with CHG Soap the night before surgery and the  morning of Surgery.  2.  If you choose to wash your hair, wash your hair first as usual with your  normal  shampoo.  3.  After you shampoo, rinse your hair and body thoroughly to remove the  shampoo.                            4.  Use CHG as you would any other liquid soap.  You can apply  chg directly  to the skin and wash                       Gently with a scrungie or clean washcloth.  5.  Apply the CHG Soap to your body ONLY FROM THE NECK DOWN.   Do not use on face/ open                           Wound or open sores. Avoid contact with eyes, ears mouth and genitals (private parts).  Wash face,  Genitals (private parts) with your normal soap.             6.  Wash thoroughly, paying special attention to the area where your surgery  will be performed.  7.  Thoroughly rinse your body with warm water from the neck down.  8.  DO NOT shower/wash with your normal soap after using and rinsing off  the CHG Soap.                9.  Pat yourself dry with a clean towel.            10.  Wear clean pajamas.            11.  Place clean sheets on your bed the night of your first shower and do not  sleep with pets. Day of Surgery : Do not apply any lotions/deodorants the morning of surgery.  Please wear clean clothes to the hospital/surgery center.  FAILURE TO FOLLOW THESE INSTRUCTIONS MAY RESULT IN THE CANCELLATION OF YOUR SURGERY PATIENT SIGNATURE_________________________________  NURSE SIGNATURE__________________________________  ________________________________________________________________________   Janet Mccarthy  An incentive spirometer is a tool that can help keep your lungs clear and active. This tool measures how well you are filling your lungs with each breath. Taking long deep breaths may help reverse or decrease the chance of developing breathing (pulmonary) problems (especially infection) following: A long period of time when you are unable to move or be active. BEFORE THE PROCEDURE  If the spirometer includes an indicator to show your best effort, your nurse or respiratory therapist will set it to a desired goal. If possible, sit up straight or lean slightly forward. Try not to slouch. Hold the incentive spirometer in an upright  position. INSTRUCTIONS FOR USE  Sit on the edge of your bed if possible, or sit up as far as you can in bed or on a chair. Hold the incentive spirometer in an upright position. Breathe out normally. Place the mouthpiece in your mouth and seal your lips tightly around it. Breathe in slowly and as deeply as possible, raising the piston or the ball toward the top of the column. Hold your breath for 3-5 seconds or for as long as possible. Allow the piston or ball to fall to the bottom of the column. Remove the mouthpiece from your mouth and breathe out normally. Rest for a few seconds and repeat Steps 1 through 7 at least 10 times every 1-2 hours when you are awake. Take your time and take a few normal breaths between deep breaths. The spirometer may include an indicator to show your best effort. Use the indicator as a goal to work toward during each repetition. After each set of 10 deep breaths, practice coughing to be sure your lungs are clear. If you have an incision (the cut made at the time of surgery), support your incision when coughing by placing a pillow or rolled up towels firmly against it. Once you are able to get out of bed, walk around indoors and cough well. You may stop using the incentive spirometer when instructed by your caregiver.  RISKS AND COMPLICATIONS Take your time so you do not get dizzy or light-headed. If you are in pain, you may need to take or ask for pain medication before doing incentive spirometry. It is harder to take a deep breath if you are having pain. AFTER USE Rest and breathe slowly and easily. It can be helpful to keep track of  a log of your progress. Your caregiver can provide you with a simple table to help with this. If you are using the spirometer at home, follow these instructions: [place] IF:  You are having difficultly using the spirometer. You have trouble using the spirometer as often as instructed. Your pain medication is not giving  enough relief while using the spirometer. You develop fever of 100.5 F (38.1 C) or higher. SEEK IMMEDIATE MEDICAL CARE IF:  You cough up bloody sputum that had not been present before. You develop fever of 102 F (38.9 C) or greater. You develop worsening pain at or near the incision site. MAKE SURE YOU:  Understand these instructions. Will watch your condition. Will get help right away if you are not doing well or get worse. Document Released: 11/29/2006 Document Revised: 10/11/2011 Document Reviewed: 01/30/2007 [facility] Patient Information 2014 [place], [place].   ________________________________________________________________________

## 2021-05-20 ENCOUNTER — Encounter (HOSPITAL_COMMUNITY)
Admission: RE | Admit: 2021-05-20 | Discharge: 2021-05-20 | Disposition: A | Discharge status: Home / Self Care | Payer: Medicare Other | Source: Ambulatory Visit | Attending: Orthopedic Surgery | Admitting: Orthopedic Surgery

## 2021-05-20 ENCOUNTER — Encounter (HOSPITAL_COMMUNITY): Payer: Self-pay

## 2021-05-20 ENCOUNTER — Other Ambulatory Visit: Payer: Self-pay

## 2021-05-20 DIAGNOSIS — Z01818 Encounter for other preprocedural examination: Secondary | ICD-10-CM | POA: Insufficient documentation

## 2021-05-20 DIAGNOSIS — E119 Type 2 diabetes mellitus without complications: Secondary | ICD-10-CM | POA: Insufficient documentation

## 2021-05-20 HISTORY — DX: Anxiety disorder, unspecified: F41.9

## 2021-05-20 HISTORY — DX: Chronic obstructive pulmonary disease, unspecified: J44.9

## 2021-05-20 LAB — COMPREHENSIVE METABOLIC PANEL
ALT: 11 U/L (ref 0–44)
AST: 14 U/L — ABNORMAL LOW (ref 15–41)
Albumin: 4.1 g/dL (ref 3.5–5.0)
Alkaline Phosphatase: 100 U/L (ref 38–126)
Anion gap: 8 (ref 5–15)
BUN: 14 mg/dL (ref 8–23)
CO2: 31 mmol/L (ref 22–32)
Calcium: 10.5 mg/dL — ABNORMAL HIGH (ref 8.9–10.3)
Chloride: 98 mmol/L (ref 98–111)
Creatinine, Ser: 1.17 mg/dL — ABNORMAL HIGH (ref 0.44–1.00)
GFR, Estimated: 46 mL/min — ABNORMAL LOW (ref >60)
Glucose, Bld: 113 mg/dL — ABNORMAL HIGH (ref 70–99)
Potassium: 4.9 mmol/L (ref 3.5–5.1)
Sodium: 137 mmol/L (ref 135–145)
Total Bilirubin: 0.8 mg/dL (ref 0.3–1.2)
Total Protein: 7.5 g/dL (ref 6.5–8.1)

## 2021-05-20 LAB — SURGICAL PCR SCREEN
MRSA, PCR: NEGATIVE
Staphylococcus aureus: NEGATIVE

## 2021-05-20 LAB — CBC
HCT: 38.8 % (ref 36.0–46.0)
Hemoglobin: 12.1 g/dL (ref 12.0–15.0)
MCH: 30.3 pg (ref 26.0–34.0)
MCHC: 31.2 g/dL (ref 30.0–36.0)
MCV: 97.2 fL (ref 80.0–100.0)
Platelets: 338 10*3/uL (ref 150–400)
RBC: 3.99 MIL/uL (ref 3.87–5.11)
RDW: 14.5 % (ref 11.5–15.5)
WBC: 10.1 10*3/uL (ref 4.0–10.5)
nRBC: 0 % (ref 0.0–0.2)

## 2021-05-20 LAB — PROTIME-INR
INR: 1 (ref 0.8–1.2)
Prothrombin Time: 12.7 seconds (ref 11.4–15.2)

## 2021-05-20 NOTE — Progress Notes (Signed)
COVID test- 05/28/21   PCP - Dr. Jerilynn Mages. [place]  Cardiologist - Dr. Christen Butter Goessel 04/15/21. Clearance on chart  Chest x-ray - no EKG - 04/15/21-epic Stress Test - no ECHO - no Cardiac Cath - no Pacemaker/ICD device last checked:NA  Sleep Study - no CPAP -   Fasting Blood Sugar - NA Checks Blood Sugar _____ times a day  Blood Thinner Instructions:NA Aspirin Instructions: Last Dose:  Anesthesia review:  no Patient denies shortness of breath, fever, cough and chest pain at PAT appointment  No SOB with any activities Patient verbalized understanding of instructions that were given to them at the PAT appointment. Patient was also instructed that they will need to review over the PAT instructions again at home before surgery. yes

## 2021-05-27 NOTE — H&P (Signed)
TOTAL HIP ADMISSION H&P  Patient is admitted for left total hip arthroplasty.  Subjective:  Chief Complaint: Left hip pain  HPI: Janet Mccarthy, 86 y.o. female, has a history of pain and functional disability in the left hip due to arthritis and patient has failed non-surgical conservative treatments for greater than 12 weeks to include NSAID's and/or analgesics, flexibility and strengthening excercises, and activity modification. Onset of symptoms was gradual, starting  several  years ago with gradually worsening course since that time. The patient noted no past surgery on the left hip. Patient currently rates pain in the left hip at 7 out of 10 with activity. Patient has pain that interfers with activities of daily living, pain with passive range of motion, and crepitus. Patient has evidence of periarticular osteophytes and joint space narrowing by imaging studies. This condition presents safety issues increasing the risk of falls.There is no current active infection.  Patient Active Problem List   Diagnosis Date Noted   Esophageal dysphagia 10/31/2014    Past Medical History:  Diagnosis Date   Anxiety    Arthritis    right hip   COPD (chronic obstructive pulmonary disease) ([place])    mild   Depression    Diverticular disease    sigmoid   Diverticulitis    Diverticulosis    Gallstones    GERD (gastroesophageal reflux disease)    Hyperlipidemia    Hyperparathyroidism ([place])    Hypothyroidism    IFG (impaired fasting glucose)    Multiple lipomas    Osteoarthritis    Osteopenia    Retroperitoneal mass    Skin cancer    nose   Vitamin D deficiency     Past Surgical History:  Procedure Laterality Date   COLON RESECTION  2010   with tumor removal   COLONOSCOPY WITH ESOPHAGOGASTRODUODENOSCOPY (EGD)  06/2018   TONSILLECTOMY AND ADENOIDECTOMY     TOTAL HIP ARTHROPLASTY  2004   right    Prior to Admission medications   Medication Sig Start Date End Date Taking?  Authorizing Provider  amitriptyline (ELAVIL) 50 MG tablet Take 50 mg by mouth daily. 02/23/17  Yes [provider]  clobetasol cream (TEMOVATE) 3.08 % Apply 1 application topically daily as needed (irritation). 09/23/14  Yes [provider]  hydrochlorothiazide (MICROZIDE) 12.5 MG capsule Take 12.5 mg by mouth daily.   Yes [provider]  levothyroxine (SYNTHROID) 75 MCG tablet Take 75 mcg by mouth daily before breakfast. 02/20/21  Yes [provider]  pantoprazole (PROTONIX) 40 MG tablet TAKE 1 TABLET ONCE DAILY. 02/03/21  Yes Irene Shipper, MD  COVID-19 mRNA bivalent vaccine, Pfizer, (PFIZER COVID-19 VAC BIVALENT) injection Inject into the muscle. 05/15/21   Carlyle Basques, MD    Allergies  Allergen Reactions   Sulfa Antibiotics Itching    Social History   Socioeconomic History   Marital status: Married    Spouse name: Joe   Number of children: 2   Years of education: Not on file   Highest education level: Not on file  Occupational History   Occupation: homemaker  Tobacco Use   Smoking status: Former    Packs/day: 2.00    Years: 15.00    Pack years: 30.00    Types: Cigarettes    Quit date: 08/03/1971    Years since quitting: 49.8   Smokeless tobacco: Never  Vaping Use   Vaping Use: Never used  Substance and Sexual Activity   Alcohol use: No   Drug use: No  Sexual activity: Not on file  Other Topics Concern   Not on file  Social History Narrative   Not on file   Social Determinants of Health   Financial Resource Strain: Not on file  Food Insecurity: Not on file  Transportation Needs: Not on file  Physical Activity: Not on file  Stress: Not on file  Social Connections: Not on file  Intimate Partner Violence: Not on file    Tobacco Use: Medium Risk   Smoking Tobacco Use: Former   Smokeless Tobacco Use: Never   Passive Exposure: Not on file   Social History   Substance and Sexual Activity  Alcohol Use No    Family History   Adopted: Yes    ROS: Constitutional: no fever, no chills, no night sweats, no significant weight loss Cardiovascular: no chest pain, no palpitations Respiratory: no cough, no shortness of breath, No COPD Gastrointestinal: no vomiting, no nausea Musculoskeletal: no swelling in Joints, Joint Pain Neurologic: no numbness, no tingling, no difficulty with balance    Objective:  Physical Exam: Well nourished and well developed.  General: Alert and oriented x3, cooperative and pleasant, no acute distress.  Head: normocephalic, atraumatic, neck supple.  Eyes: EOMI.  Respiratory: breath sounds clear in all fields, no wheezing, rales, or rhonchi. Cardiovascular: Regular rate and rhythm, no murmurs, gallops or rubs.  Abdomen: non-tender to palpation and soft, normoactive bowel sounds. Musculoskeletal:  The patient has an antalgic gait pattern favoring the left side without the use of assistive devices.     Right Hip Exam:   The range of motion: Flexion to 110 degrees, Internal Rotation to 20 degrees, External Rotation to 30 degrees, and abduction to 30 degrees with discomfort on rotation.   There is no tenderness over the greater trochanteric bursa.     Left Hip Exam:   The range of motion: Limited to flexion100, IR 0, ER 20 abduction 20   There is no tenderness over the greater trochanteric bursa  Calves soft and nontender. Motor function intact in LE. Strength 5/5 LE bilaterally. Neuro: Distal pulses 2+. Sensation to light touch intact in LE.  Vital signs in last 24 hours:    Imaging Review Radiographs- AP pelvis, AP and lateral of the left hip dated 01/2021 demonstrate bone-on-bone arthritis in the left hip. The right hip did not show the whole stem, but she did have x-rays back in 09/2020 that showed the whole stem. She has evidence of a healed stress fracture in the femur. I looked back to x-rays from 2010, and that fracture was not present at that time. She does have some  atrophy of the cortical bone compared to 2010. I do not see any definitive loosening of the stem, but the overall appearance has changed in the past 10 years.  Assessment/Plan:  End stage arthritis, left hip  The patient history, physical examination, clinical judgement of the provider and imaging studies are consistent with end stage degenerative joint disease of the left hip and total hip arthroplasty is deemed medically necessary. The treatment options including medical management, injection therapy, arthroscopy and arthroplasty were discussed at length. The risks and benefits of total hip arthroplasty were presented and reviewed. The risks due to aseptic loosening, infection, stiffness, dislocation/subluxation, thromboembolic complications and other imponderables were discussed. The patient acknowledged the explanation, agreed to proceed with the plan and consent was signed. Patient is being admitted for inpatient treatment for surgery, pain control, PT, OT, prophylactic antibiotics, VTE prophylaxis, progressive ambulation and ADLs and  discharge planning.The patient is planning to be discharged  home .   Patient's anticipated LOS is less than 2 midnights, meeting these requirements: - Lives within 1 hour of care - Has a competent adult at home to recover with post-op - NO history of  - Chronic pain requiring opioids  - Diabetes  - Coronary Artery Disease  - Heart failure  - Heart attack  - Stroke  - DVT/VTE  - Cardiac arrhythmia  - Respiratory Failure/COPD  - Renal failure  - Anemia  - Advanced Liver disease    Therapy Plans: HEP Disposition: Home with Husband Planned DVT Prophylaxis: Aspirin 325mg  DME Needed: Vassie Moselle  PCP: Crist Infante, MD - Clearance received TXA: IV Allergies: Sulfa Anesthesia Concerns: None BMI: 26 Last HgbA1c: N/A  Pharmacy: [facility]   - Patient was instructed on what medications to stop prior to surgery. - Follow-up visit in 2 weeks  with Dr. Wynelle Link - Begin physical therapy following surgery - Pre-operative lab work as pre-surgical testing - Prescriptions will be provided in hospital at time of discharge  Fenton Foy, [facility], PA-C Orthopedic Surgery [facility] [place]

## 2021-05-28 ENCOUNTER — Other Ambulatory Visit: Payer: Self-pay | Admitting: Orthopedic Surgery

## 2021-05-28 LAB — SARS CORONAVIRUS 2 (TAT 6-24 HRS): SARS Coronavirus 2: NEGATIVE

## 2021-06-01 ENCOUNTER — Ambulatory Visit (HOSPITAL_COMMUNITY): Payer: Medicare Other | Admitting: Anesthesiology

## 2021-06-01 ENCOUNTER — Observation Stay (HOSPITAL_COMMUNITY): Payer: Medicare Other

## 2021-06-01 ENCOUNTER — Encounter (HOSPITAL_COMMUNITY)
Admission: RE | Disposition: A | Discharge status: Home / Self Care | Payer: Self-pay | Source: Ambulatory Visit | Attending: Orthopedic Surgery

## 2021-06-01 ENCOUNTER — Ambulatory Visit (HOSPITAL_COMMUNITY): Payer: Medicare Other

## 2021-06-01 ENCOUNTER — Observation Stay (HOSPITAL_COMMUNITY)
Admission: RE | Admit: 2021-06-01 | Discharge: 2021-06-02 | Disposition: A | Discharge status: Home / Self Care | Payer: Medicare Other | Source: Ambulatory Visit | Attending: Orthopedic Surgery | Admitting: Orthopedic Surgery

## 2021-06-01 DIAGNOSIS — Z96649 Presence of unspecified artificial hip joint: Secondary | ICD-10-CM

## 2021-06-01 DIAGNOSIS — J449 Chronic obstructive pulmonary disease, unspecified: Secondary | ICD-10-CM | POA: Diagnosis not present

## 2021-06-01 DIAGNOSIS — M1612 Unilateral primary osteoarthritis, left hip: Secondary | ICD-10-CM | POA: Diagnosis present

## 2021-06-01 DIAGNOSIS — Z419 Encounter for procedure for purposes other than remedying health state, unspecified: Secondary | ICD-10-CM

## 2021-06-01 DIAGNOSIS — Z87891 Personal history of nicotine dependence: Secondary | ICD-10-CM | POA: Diagnosis not present

## 2021-06-01 DIAGNOSIS — Z79899 Other long term (current) drug therapy: Secondary | ICD-10-CM | POA: Diagnosis not present

## 2021-06-01 DIAGNOSIS — Z85828 Personal history of other malignant neoplasm of skin: Secondary | ICD-10-CM | POA: Insufficient documentation

## 2021-06-01 DIAGNOSIS — E039 Hypothyroidism, unspecified: Secondary | ICD-10-CM | POA: Diagnosis not present

## 2021-06-01 DIAGNOSIS — M169 Osteoarthritis of hip, unspecified: Secondary | ICD-10-CM | POA: Diagnosis present

## 2021-06-01 HISTORY — PX: TOTAL HIP ARTHROPLASTY: SHX124

## 2021-06-01 LAB — TYPE AND SCREEN
ABO/RH(D): O POS
Antibody Screen: NEGATIVE

## 2021-06-01 LAB — ABO/RH: ABO/RH(D): O POS

## 2021-06-01 SURGERY — ARTHROPLASTY, HIP, TOTAL, ANTERIOR APPROACH
Anesthesia: Monitor Anesthesia Care | Site: Hip | Laterality: Left

## 2021-06-01 MED ORDER — ORAL CARE MOUTH RINSE: 15.0000 mL | Freq: Once | OROMUCOSAL | Status: AC

## 2021-06-01 MED ORDER — TRANEXAMIC ACID-NACL 1000-0.7 MG/100ML-% IV SOLN
1000.0000 mg | INTRAVENOUS | Status: AC
  Administered 2021-06-01: 1000 mg via INTRAVENOUS
  Filled 2021-06-01: qty 100

## 2021-06-01 MED ORDER — PANTOPRAZOLE SODIUM 40 MG PO TBEC
40.0000 mg | DELAYED_RELEASE_TABLET | Freq: Every day | ORAL | Status: DC
  Administered 2021-06-02: 40 mg via ORAL
  Filled 2021-06-01: qty 1

## 2021-06-01 MED ORDER — ACETAMINOPHEN 325 MG PO TABS: 325.0000 mg | ORAL_TABLET | Freq: Four times a day (QID) | ORAL | Status: DC | PRN

## 2021-06-01 MED ORDER — SODIUM CHLORIDE 0.9 % IV SOLN: INTRAVENOUS | Status: DC

## 2021-06-01 MED ORDER — METOCLOPRAMIDE HCL 5 MG/ML IJ SOLN: 5.0000 mg | Freq: Three times a day (TID) | INTRAMUSCULAR | Status: DC | PRN

## 2021-06-01 MED ORDER — DEXAMETHASONE SODIUM PHOSPHATE 10 MG/ML IJ SOLN
8.0000 mg | Freq: Once | INTRAMUSCULAR | Status: AC
  Administered 2021-06-01: 8 mg via INTRAVENOUS

## 2021-06-01 MED ORDER — METHOCARBAMOL 500 MG PO TABS
500.0000 mg | ORAL_TABLET | Freq: Four times a day (QID) | ORAL | Status: DC | PRN
  Administered 2021-06-02: 500 mg via ORAL
  Filled 2021-06-01: qty 1

## 2021-06-01 MED ORDER — FENTANYL CITRATE (PF) 100 MCG/2ML IJ SOLN
INTRAMUSCULAR | Status: AC
  Filled 2021-06-01: qty 2

## 2021-06-01 MED ORDER — PROPOFOL 1000 MG/100ML IV EMUL
INTRAVENOUS | Status: AC
  Filled 2021-06-01: qty 100

## 2021-06-01 MED ORDER — POLYETHYLENE GLYCOL 3350 17 G PO PACK: 17.0000 g | PACK | Freq: Every day | ORAL | Status: DC | PRN

## 2021-06-01 MED ORDER — FENTANYL CITRATE PF 50 MCG/ML IJ SOSY: 25.0000 ug | PREFILLED_SYRINGE | INTRAMUSCULAR | Status: DC | PRN

## 2021-06-01 MED ORDER — 0.9 % SODIUM CHLORIDE (POUR BTL) OPTIME
TOPICAL | Status: DC | PRN
  Administered 2021-06-01: 1000 mL

## 2021-06-01 MED ORDER — HYDROCODONE-ACETAMINOPHEN 5-325 MG PO TABS
1.0000 | ORAL_TABLET | ORAL | Status: DC | PRN
  Administered 2021-06-01 – 2021-06-02 (×3): 1 via ORAL
  Filled 2021-06-01 (×3): qty 1

## 2021-06-01 MED ORDER — BUPIVACAINE IN DEXTROSE 0.75-8.25 % IT SOLN
INTRATHECAL | Status: DC | PRN
  Administered 2021-06-01: 1.8 mL via INTRATHECAL

## 2021-06-01 MED ORDER — ASPIRIN EC 325 MG PO TBEC
325.0000 mg | DELAYED_RELEASE_TABLET | Freq: Two times a day (BID) | ORAL | Status: DC
  Administered 2021-06-02: 325 mg via ORAL
  Filled 2021-06-01: qty 1

## 2021-06-01 MED ORDER — CHLORHEXIDINE GLUCONATE 0.12 % MT SOLN
15.0000 mL | Freq: Once | OROMUCOSAL | Status: AC
Start: 2021-06-01 — End: 2021-06-01
  Administered 2021-06-01: 15 mL via OROMUCOSAL

## 2021-06-01 MED ORDER — DEXAMETHASONE SODIUM PHOSPHATE 10 MG/ML IJ SOLN
INTRAMUSCULAR | Status: AC
  Filled 2021-06-01: qty 1

## 2021-06-01 MED ORDER — AMITRIPTYLINE HCL 50 MG PO TABS
50.0000 mg | ORAL_TABLET | Freq: Every day | ORAL | Status: DC
  Administered 2021-06-01: 50 mg via ORAL
  Filled 2021-06-01 (×3): qty 1

## 2021-06-01 MED ORDER — PHENOL 1.4 % MT LIQD: 1.0000 | OROMUCOSAL | Status: DC | PRN

## 2021-06-01 MED ORDER — METOCLOPRAMIDE HCL 5 MG PO TABS: 5.0000 mg | ORAL_TABLET | Freq: Three times a day (TID) | ORAL | Status: DC | PRN

## 2021-06-01 MED ORDER — LEVOTHYROXINE SODIUM 75 MCG PO TABS
75.0000 ug | ORAL_TABLET | Freq: Every day | ORAL | Status: DC
  Administered 2021-06-02: 75 ug via ORAL
  Filled 2021-06-01: qty 1

## 2021-06-01 MED ORDER — CEFAZOLIN SODIUM-DEXTROSE 2-4 GM/100ML-% IV SOLN
2.0000 g | Freq: Four times a day (QID) | INTRAVENOUS | Status: AC
  Administered 2021-06-01 – 2021-06-02 (×2): 2 g via INTRAVENOUS
  Filled 2021-06-01 (×2): qty 100

## 2021-06-01 MED ORDER — OXYCODONE HCL 5 MG PO TABS: 5.0000 mg | ORAL_TABLET | Freq: Once | ORAL | Status: DC | PRN

## 2021-06-01 MED ORDER — POVIDONE-IODINE 10 % EX SWAB
2.0000 "application " | Freq: Once | CUTANEOUS | Status: AC
  Administered 2021-06-01: 2 via TOPICAL

## 2021-06-01 MED ORDER — ONDANSETRON HCL 4 MG/2ML IJ SOLN: 4.0000 mg | Freq: Four times a day (QID) | INTRAMUSCULAR | Status: DC | PRN

## 2021-06-01 MED ORDER — PHENYLEPHRINE HCL (PRESSORS) 10 MG/ML IV SOLN
INTRAVENOUS | Status: AC
  Filled 2021-06-01: qty 2

## 2021-06-01 MED ORDER — BUPIVACAINE HCL 0.25 % IJ SOLN
INTRAMUSCULAR | Status: DC | PRN
  Administered 2021-06-01: 30 mL

## 2021-06-01 MED ORDER — DOCUSATE SODIUM 100 MG PO CAPS
100.0000 mg | ORAL_CAPSULE | Freq: Two times a day (BID) | ORAL | Status: DC
  Administered 2021-06-01 – 2021-06-02 (×2): 100 mg via ORAL
  Filled 2021-06-01 (×2): qty 1

## 2021-06-01 MED ORDER — EPHEDRINE SULFATE-NACL 50-0.9 MG/10ML-% IV SOSY
PREFILLED_SYRINGE | INTRAVENOUS | Status: DC | PRN
  Administered 2021-06-01: 10 mg via INTRAVENOUS

## 2021-06-01 MED ORDER — LACTATED RINGERS IV SOLN: INTRAVENOUS | Status: DC

## 2021-06-01 MED ORDER — BUPIVACAINE HCL 0.25 % IJ SOLN
INTRAMUSCULAR | Status: AC
  Filled 2021-06-01: qty 1

## 2021-06-01 MED ORDER — PROPOFOL 500 MG/50ML IV EMUL
INTRAVENOUS | Status: DC | PRN
  Administered 2021-06-01: 75 ug/kg/min via INTRAVENOUS

## 2021-06-01 MED ORDER — ONDANSETRON HCL 4 MG/2ML IJ SOLN: 4.0000 mg | Freq: Once | INTRAMUSCULAR | Status: DC | PRN

## 2021-06-01 MED ORDER — WATER FOR IRRIGATION, STERILE IR SOLN
Status: DC | PRN
  Administered 2021-06-01: 2000 mL

## 2021-06-01 MED ORDER — ONDANSETRON HCL 4 MG PO TABS: 4.0000 mg | ORAL_TABLET | Freq: Four times a day (QID) | ORAL | Status: DC | PRN

## 2021-06-01 MED ORDER — BISACODYL 10 MG RE SUPP: 10.0000 mg | Freq: Every day | RECTAL | Status: DC | PRN

## 2021-06-01 MED ORDER — DEXAMETHASONE SODIUM PHOSPHATE 10 MG/ML IJ SOLN
10.0000 mg | Freq: Once | INTRAMUSCULAR | Status: AC
  Administered 2021-06-02: 10 mg via INTRAVENOUS
  Filled 2021-06-01: qty 1

## 2021-06-01 MED ORDER — TRAMADOL HCL 50 MG PO TABS
50.0000 mg | ORAL_TABLET | Freq: Four times a day (QID) | ORAL | Status: DC | PRN
  Administered 2021-06-02 (×2): 50 mg via ORAL
  Filled 2021-06-01 (×2): qty 1

## 2021-06-01 MED ORDER — ACETAMINOPHEN 10 MG/ML IV SOLN
1000.0000 mg | Freq: Four times a day (QID) | INTRAVENOUS | Status: DC
Start: 2021-06-01 — End: 2021-06-01
  Administered 2021-06-01: 1000 mg via INTRAVENOUS
  Filled 2021-06-01: qty 100

## 2021-06-01 MED ORDER — LIP MEDEX EX OINT
TOPICAL_OINTMENT | CUTANEOUS | Status: AC
  Administered 2021-06-01: 75
  Filled 2021-06-01: qty 7

## 2021-06-01 MED ORDER — PHENYLEPHRINE 40 MCG/ML (10ML) SYRINGE FOR IV PUSH (FOR BLOOD PRESSURE SUPPORT)
PREFILLED_SYRINGE | INTRAVENOUS | Status: DC | PRN
  Administered 2021-06-01: 120 ug via INTRAVENOUS
  Administered 2021-06-01 (×2): 80 ug via INTRAVENOUS
  Administered 2021-06-01: 120 ug via INTRAVENOUS

## 2021-06-01 MED ORDER — ONDANSETRON HCL 4 MG/2ML IJ SOLN
INTRAMUSCULAR | Status: AC
  Filled 2021-06-01: qty 2

## 2021-06-01 MED ORDER — MENTHOL 3 MG MT LOZG: 1.0000 | LOZENGE | OROMUCOSAL | Status: DC | PRN

## 2021-06-01 MED ORDER — PROPOFOL 10 MG/ML IV BOLUS
INTRAVENOUS | Status: DC | PRN
  Administered 2021-06-01 (×2): 10 mg via INTRAVENOUS
  Administered 2021-06-01: 20 mg via INTRAVENOUS
  Administered 2021-06-01 (×2): 10 mg via INTRAVENOUS

## 2021-06-01 MED ORDER — ONDANSETRON HCL 4 MG/2ML IJ SOLN
INTRAMUSCULAR | Status: DC | PRN
  Administered 2021-06-01: 4 mg via INTRAVENOUS

## 2021-06-01 MED ORDER — OXYCODONE HCL 5 MG/5ML PO SOLN
5.0000 mg | Freq: Once | ORAL | Status: DC | PRN
Start: 2021-06-01 — End: 2021-06-01

## 2021-06-01 MED ORDER — FENTANYL CITRATE (PF) 100 MCG/2ML IJ SOLN
INTRAMUSCULAR | Status: DC | PRN
  Administered 2021-06-01: 25 ug via INTRAVENOUS
  Administered 2021-06-01: 50 ug via INTRAVENOUS
  Administered 2021-06-01: 25 ug via INTRAVENOUS

## 2021-06-01 MED ORDER — MORPHINE SULFATE (PF) 2 MG/ML IV SOLN: 0.5000 mg | INTRAVENOUS | Status: DC | PRN

## 2021-06-01 MED ORDER — METHOCARBAMOL 500 MG IVPB - SIMPLE MED
500.0000 mg | Freq: Four times a day (QID) | INTRAVENOUS | Status: DC | PRN
  Filled 2021-06-01: qty 50

## 2021-06-01 MED ORDER — HYDROCHLOROTHIAZIDE 12.5 MG PO TABS
12.5000 mg | ORAL_TABLET | Freq: Every day | ORAL | Status: DC
  Administered 2021-06-02: 12.5 mg via ORAL
  Filled 2021-06-01: qty 1

## 2021-06-01 MED ORDER — CEFAZOLIN SODIUM-DEXTROSE 2-4 GM/100ML-% IV SOLN
2.0000 g | INTRAVENOUS | Status: AC
  Administered 2021-06-01: 2 g via INTRAVENOUS
  Filled 2021-06-01: qty 100

## 2021-06-01 MED ORDER — PHENYLEPHRINE HCL-NACL 20-0.9 MG/250ML-% IV SOLN
INTRAVENOUS | Status: DC | PRN
  Administered 2021-06-01: 30 ug/min via INTRAVENOUS

## 2021-06-01 SURGICAL SUPPLY — 45 items
BAG COUNTER SPONGE SURGICOUNT (BAG) ×1 IMPLANT
BAG DECANTER FOR FLEXI CONT (MISCELLANEOUS) IMPLANT
BAG SPEC THK2 15X12 ZIP CLS (MISCELLANEOUS)
BAG SPNG CNTER NS LX DISP (BAG) ×1
BAG ZIPLOCK 12X15 (MISCELLANEOUS) IMPLANT
BLADE SAG 18X100X1.27 (BLADE) ×2 IMPLANT
COVER PERINEAL POST (MISCELLANEOUS) ×2 IMPLANT
COVER SURGICAL LIGHT HANDLE (MISCELLANEOUS) ×2 IMPLANT
CUP ACET PINNACLE SECTR 50MM (Hips) IMPLANT
DECANTER SPIKE VIAL GLASS SM (MISCELLANEOUS) ×2 IMPLANT
DRAPE FOOT SWITCH (DRAPES) ×2 IMPLANT
DRAPE STERI IOBAN 125X83 (DRAPES) ×2 IMPLANT
DRAPE U-SHAPE 47X51 STRL (DRAPES) ×4 IMPLANT
DRSG AQUACEL AG ADV 3.5X10 (GAUZE/BANDAGES/DRESSINGS) ×2 IMPLANT
DURAPREP 26ML APPLICATOR (WOUND CARE) ×2 IMPLANT
ELECT REM PT RETURN 15FT ADLT (MISCELLANEOUS) ×2 IMPLANT
GLOVE SRG 8 PF TXTR STRL LF DI (GLOVE) ×1 IMPLANT
GLOVE SURG ENC MOIS LTX SZ6.5 (GLOVE) ×2 IMPLANT
GLOVE SURG ENC MOIS LTX SZ7 (GLOVE) ×2 IMPLANT
GLOVE SURG ENC MOIS LTX SZ8 (GLOVE) ×4 IMPLANT
GLOVE SURG UNDER POLY LF SZ7 (GLOVE) ×2 IMPLANT
GLOVE SURG UNDER POLY LF SZ8 (GLOVE) ×2
GLOVE SURG UNDER POLY LF SZ8.5 (GLOVE) IMPLANT
GOWN STRL REUS W/TWL LRG LVL3 (GOWN DISPOSABLE) ×4 IMPLANT
GOWN STRL REUS W/TWL XL LVL3 (GOWN DISPOSABLE) IMPLANT
HEAD FEM STD 32X+1 STRL (Hips) ×1 IMPLANT
HOLDER FOLEY CATH W/STRAP (MISCELLANEOUS) ×2 IMPLANT
KIT TURNOVER KIT A (KITS) ×1 IMPLANT
LINER MARATHON 32 50 (Hips) ×1 IMPLANT
MANIFOLD NEPTUNE II (INSTRUMENTS) ×2 IMPLANT
PACK ANTERIOR HIP CUSTOM (KITS) ×2 IMPLANT
PENCIL SMOKE EVACUATOR COATED (MISCELLANEOUS) ×2 IMPLANT
PINNACLE SECTOR CUP 50MM (Hips) ×2 IMPLANT
SPONGE T-LAP 18X18 ~~LOC~~+RFID (SPONGE) ×2 IMPLANT
STEM FEMORAL SZ 6MM STD ACTIS (Stem) ×1 IMPLANT
STRIP CLOSURE SKIN 1/2X4 (GAUZE/BANDAGES/DRESSINGS) ×2 IMPLANT
SUT ETHIBOND NAB CT1 #1 30IN (SUTURE) ×2 IMPLANT
SUT MNCRL AB 4-0 PS2 18 (SUTURE) ×2 IMPLANT
SUT STRATAFIX 0 PDS 27 VIOLET (SUTURE) ×2
SUT VIC AB 2-0 CT1 27 (SUTURE) ×4
SUT VIC AB 2-0 CT1 TAPERPNT 27 (SUTURE) ×2 IMPLANT
SUTURE STRATFX 0 PDS 27 VIOLET (SUTURE) ×1 IMPLANT
SYR 50ML LL SCALE MARK (SYRINGE) IMPLANT
TRAY FOLEY MTR SLVR 14FR STAT (SET/KITS/TRAYS/PACK) ×1 IMPLANT
TUBE SUCTION HIGH CAP CLEAR NV (SUCTIONS) ×2 IMPLANT

## 2021-06-01 NOTE — Op Note (Signed)
OPERATIVE REPORT- TOTAL HIP ARTHROPLASTY   PREOPERATIVE DIAGNOSIS: Osteoarthritis of the Left hip.   POSTOPERATIVE DIAGNOSIS: Osteoarthritis of the Left  hip.   PROCEDURE: Left total hip arthroplasty, anterior approach.   SURGEON: Gaynelle Arabian, MD   ASSISTANT: Molli Barrows, PA-C  ANESTHESIA:  Spinal  ESTIMATED BLOOD LOSS:-200 mL    DRAINS: Hemovac x1.   COMPLICATIONS: None   CONDITION: PACU - hemodynamically stable.   BRIEF CLINICAL NOTE: Janet Mccarthy is a 86 y.o. female who has advanced end-  stage arthritis of their Left  hip with progressively worsening pain and  dysfunction.The patient has failed nonoperative management and presents for  total hip arthroplasty.   PROCEDURE IN DETAIL: After successful administration of spinal  anesthetic, the traction boots for the [facility] bed were placed on both  feet and the patient was placed onto the [facility] bed, boots placed into the leg  holders. The Left hip was then isolated from the perineum with plastic  drapes and prepped and draped in the usual sterile fashion. ASIS and  greater trochanter were marked and a oblique incision was made, starting  at about 1 cm lateral and 2 cm distal to the ASIS and coursing towards  the anterior cortex of the femur. The skin was cut with a 10 blade  through subcutaneous tissue to the level of the fascia overlying the  tensor fascia lata muscle. The fascia was then incised in line with the  incision at the junction of the anterior third and posterior 2/3rd. The  muscle was teased off the fascia and then the interval between the TFL  and the rectus was developed. The Hohmann retractor was then placed at  the top of the femoral neck over the capsule. The vessels overlying the  capsule were cauterized and the fat on top of the capsule was removed.  A Hohmann retractor was then placed anterior underneath the rectus  femoris to give exposure to the entire anterior capsule. A T-shaped   capsulotomy was performed. The edges were tagged and the femoral head  was identified.       Osteophytes are removed off the superior acetabulum.  The femoral neck was then cut in situ with an oscillating saw. Traction  was then applied to the left lower extremity utilizing the [facility]  traction. The femoral head was then removed. Retractors were placed  around the acetabulum and then circumferential removal of the labrum was  performed. Osteophytes were also removed. Reaming starts at 47 mm to  medialize and  Increased in 2 mm increments to 49 mm. We reamed in  approximately 40 degrees of abduction, 20 degrees anteversion. A 50 mm  pinnacle acetabular shell was then impacted in anatomic position under  fluoroscopic guidance with excellent purchase. We did not need to place  any additional dome screws. A 70mm neutral + 4 marathon liner was then  placed into the acetabular shell.       The femoral lift was then placed along the lateral aspect of the femur  just distal to the vastus ridge. The leg was  externally rotated and capsule  was stripped off the inferior aspect of the femoral neck down to the  level of the lesser trochanter, this was done with electrocautery. The femur was lifted after this was performed. The  leg was then placed in an extended and adducted position essentially delivering the femur. We also removed the capsule superiorly and the piriformis from the piriformis fossa  to gain excellent exposure of the  proximal femur. Rongeur was used to remove some cancellous bone to get  into the lateral portion of the proximal femur for placement of the  initial starter reamer. The starter broaches was placed  the starter broach  and was shown to go down the center of the canal. Broaching  with the Actis system was then performed starting at size 0  coursing  Up to size 6. A size 6 had excellent torsional and rotational  and axial stability. The trial standard offset neck was then  placed  with a 32 + 1 trial head. The hip was then reduced. We confirmed that  the stem was in the canal both on AP and lateral x-rays. It also has excellent sizing. The hip was reduced with outstanding stability through full extension and full external rotation.. AP pelvis was taken and the leg lengths were measured and found to be equal. Hip was then dislocated again and the femoral head and neck removed. The  femoral broach was removed. Size 6 Actis stem with a standard offset  neck was then impacted into the femur following native anteversion. Has  excellent purchase in the canal. Excellent torsional and rotational and  axial stability. It is confirmed to be in the canal on AP and lateral  fluoroscopic views. The 32 + 1 metal head was placed and the hip  reduced with outstanding stability. Again AP pelvis was taken and it  confirmed that the leg lengths were equal. The wound was then copiously  irrigated with saline solution and the capsule reattached and repaired  with Ethibond suture. 30 ml of .25% Bupivicaine was  injected into the capsule and into the edge of the tensor fascia lata as well as subcutaneous tissue. The fascia overlying the tensor fascia lata was then closed with a running #1 V-Loc. Subcu was closed with interrupted 2-0 Vicryl and subcuticular running 4-0 Monocryl. Incision was cleaned  and dried. Steri-Strips and a bulky sterile dressing applied. The patient was awakened and transported to  recovery in stable condition.        Please note that a surgical assistant was a medical necessity for this procedure to perform it in a safe and expeditious manner. Assistant was necessary to provide appropriate retraction of vital neurovascular structures and to prevent femoral fracture and allow for anatomic placement of the prosthesis.  Gaynelle Arabian, M.D.

## 2021-06-01 NOTE — Anesthesia Postprocedure Evaluation (Signed)
Anesthesia Post Note  Patient: Janet Mccarthy  Procedure(s) Performed: TOTAL HIP ARTHROPLASTY ANTERIOR APPROACH (Left: Hip)     Patient location during evaluation: PACU Anesthesia Type: MAC and Spinal Level of consciousness: awake and alert and oriented Pain management: pain level controlled Vital Signs Assessment: post-procedure vital signs reviewed and stable Respiratory status: spontaneous breathing, nonlabored ventilation and respiratory function stable Cardiovascular status: blood pressure returned to baseline and stable Postop Assessment: no headache, no backache, spinal receding and no apparent nausea or vomiting Anesthetic complications: no   No notable events documented.  Last Vitals:  Vitals:   06/01/21 1530 06/01/21 1545  BP: (!) 130/57 (!) 131/54  Pulse: 82 82  Resp: 20 11  Temp:    SpO2: 100% 99%    Last Pain:  Vitals:   06/01/21 1545  TempSrc:   PainSc: 0-No pain                 Pervis Hocking

## 2021-06-01 NOTE — Plan of Care (Signed)
  Problem: Education: Goal: Knowledge of the prescribed therapeutic regimen will improve 06/01/2021 1755 by Jenean Lindau, RN Outcome: Progressing 06/01/2021 1755 by Jenean Lindau, RN Outcome: Progressing Goal: Understanding of discharge needs will improve 06/01/2021 1755 by Jenean Lindau, RN Outcome: Progressing 06/01/2021 1755 by Jenean Lindau, RN Outcome: Progressing Goal: Individualized Educational Video(s) 06/01/2021 1755 by Jenean Lindau, RN Outcome: Progressing 06/01/2021 1755 by Jenean Lindau, RN Outcome: Progressing   Problem: Activity: Goal: Ability to avoid complications of mobility impairment will improve 06/01/2021 1755 by Jenean Lindau, RN Outcome: Progressing 06/01/2021 1755 by Jenean Lindau, RN Outcome: Progressing Goal: Ability to tolerate increased activity will improve 06/01/2021 1755 by Jenean Lindau, RN Outcome: Progressing 06/01/2021 1755 by Jenean Lindau, RN Outcome: Progressing   Problem: Clinical Measurements: Goal: Postoperative complications will be avoided or minimized 06/01/2021 1755 by Achusim, Manuela Neptune, RN Outcome: Progressing 06/01/2021 1755 by Jenean Lindau, RN Outcome: Progressing   Problem: Pain Management: Goal: Pain level will decrease with appropriate interventions 06/01/2021 1755 by Jenean Lindau, RN Outcome: Progressing 06/01/2021 1755 by Jenean Lindau, RN Outcome: Progressing   Problem: Skin Integrity: Goal: Will show signs of wound healing 06/01/2021 1755 by Jenean Lindau, RN Outcome: Progressing 06/01/2021 1755 by Jenean Lindau, RN Outcome: Progressing

## 2021-06-01 NOTE — Interval H&P Note (Signed)
History and Physical Interval Note:  06/01/2021 11:31 AM  Janet Mccarthy  has presented today for surgery, with the diagnosis of left hip osteoarthritis.  The various methods of treatment have been discussed with the patient and family. After consideration of risks, benefits and other options for treatment, the patient has consented to  Procedure(s): TOTAL HIP ARTHROPLASTY ANTERIOR APPROACH (Left) as a surgical intervention.  The patient's history has been reviewed, patient examined, no change in status, stable for surgery.  I have reviewed the patient's chart and labs.  Questions were answered to the patient's satisfaction.     Pilar Plate Analysse Quinonez

## 2021-06-01 NOTE — Anesthesia Procedure Notes (Signed)
Spinal  Patient location during procedure: OR Start time: 06/01/2021 2:37 PM End time: 06/01/2021 1:46 PM Reason for block: surgical anesthesia Staffing Performed: anesthesiologist  Anesthesiologist: Pervis Hocking, DO Resident/CRNA: Milford Cage, CRNA Preanesthetic Checklist Completed: patient identified, IV checked, site marked, risks and benefits discussed, surgical consent, monitors and equipment checked, pre-op evaluation and timeout performed Spinal Block Patient position: sitting Prep: DuraPrep Patient monitoring: heart rate, cardiac monitor, continuous pulse ox and blood pressure Approach: midline Location: L3-4 Injection technique: single-shot Needle Needle type: Pencan  Needle gauge: 24 G Needle length: 10 cm Assessment Sensory level: T6 Events: CSF return and second provider Additional Notes Functioning IV was confirmed and monitors were applied. Sterile prep and drape, including hand hygiene and sterile gloves were used. The patient was positioned and the spine was prepped. The skin was anesthetized with lidocaine.  Free flow of clear CSF was obtained prior to injecting local anesthetic into the CSF.  The spinal needle aspirated freely following injection.  The needle was carefully withdrawn.  The patient tolerated the procedure well.

## 2021-06-01 NOTE — Discharge Instructions (Signed)
°Janet Aluisio, MD °Total Joint Specialist °[facility] °[address] °[place], [place] [address] °[phone] ° °ANTERIOR APPROACH TOTAL HIP REPLACEMENT POSTOPERATIVE DIRECTIONS ° ° ° ° °Hip Rehabilitation, Guidelines Following Surgery  °The results of a hip operation are greatly improved after range of motion and muscle strengthening exercises. Follow all safety measures which are given to protect your hip. If any of these exercises cause increased pain or swelling in your joint, decrease the amount until you are comfortable again. Then slowly increase the exercises. Call your caregiver if you have problems or questions.  ° °BLOOD CLOT PREVENTION °Take a 325 mg Aspirin two times a day for three weeks following surgery. Then take an 81 mg Aspirin once a day for three weeks. Then discontinue Aspirin. °You may resume your vitamins/supplements upon discharge from the hospital. °Do not take any NSAIDs (Advil, Aleve, Ibuprofen, Meloxicam, etc.) until you have discontinued the 325 mg Aspirin. ° °HOME CARE INSTRUCTIONS  °Remove items at home which could result in a fall. This includes throw rugs or furniture in walking pathways.  °ICE to the affected hip as frequently as 20-30 minutes an hour and then as needed for pain and swelling. Continue to use ice on the hip for pain and swelling from surgery. You may notice swelling that will progress down to the foot and ankle. This is normal after surgery. Elevate the leg when you are not up walking on it.   °Continue to use the breathing machine which will help keep your temperature down.  It is common for your temperature to cycle up and down following surgery, especially at night when you are not up moving around and exerting yourself.  The breathing machine keeps your lungs expanded and your temperature down. ° °DIET °You may resume your previous home diet once your are discharged from the hospital. ° °DRESSING / WOUND CARE / SHOWERING °You have  an adhesive waterproof bandage over the incision. Leave this in place until your first follow-up appointment. Once you remove this you will not need to place another bandage.  °You may begin showering 3 days following surgery, but do not submerge the incision under water. ° °ACTIVITY °For the first 3-5 days, it is important to rest and keep the operative leg elevated. You should, as a general rule, rest for 50 minutes and walk/stretch for 10 minutes per hour. After 5 days, you may slowly increase activity as tolerated.  °Perform the exercises you were provided twice a day for about 15-20 minutes each session. Begin these 2 days following surgery. °Walk with your walker as instructed. Use the walker until you are comfortable transitioning to a cane. Walk with the cane in the opposite hand of the operative leg. You may discontinue the cane once you are comfortable and walking steadily. °Avoid periods of inactivity such as sitting longer than an hour when not asleep. This helps prevent blood clots.  °Do not drive a car for 6 weeks or until released by your surgeon.  °Do not drive while taking narcotics. ° °TED HOSE STOCKINGS °Wear the elastic stockings on both legs for three weeks following surgery during the day. You may remove them at night while sleeping. ° °WEIGHT BEARING °Weight bearing as tolerated with assist device (walker, cane, etc) as directed, use it as long as suggested by your surgeon or therapist, typically at least 4-6 weeks. ° °POSTOPERATIVE CONSTIPATION PROTOCOL °Constipation - defined medically as fewer than three stools per week and severe constipation as   less than one stool per week. ° °One of the most common issues patients have following surgery is constipation.  Even if you have a regular bowel pattern at home, your normal regimen is likely to be disrupted due to multiple reasons following surgery.  Combination of anesthesia, postoperative narcotics, change in appetite and fluid intake all can  affect your bowels.  In order to avoid complications following surgery, here are some recommendations in order to help you during your recovery period. ° °Colace (docusate) - Pick up an over-the-counter form of Colace or another stool softener and take twice a day as long as you are requiring postoperative pain medications.  Take with a full glass of water daily.  If you experience loose stools or diarrhea, hold the colace until you stool forms back up.  If your symptoms do not get better within 1 week or if they get worse, check with your doctor. °Dulcolax (bisacodyl) - Pick up over-the-counter and take as directed by the product packaging as needed to assist with the movement of your bowels.  Take with a full glass of water.  Use this product as needed if not relieved by Colace only.  °MiraLax (polyethylene glycol) - Pick up over-the-counter to have on hand.  MiraLax is a solution that will increase the amount of water in your bowels to assist with bowel movements.  Take as directed and can mix with a glass of water, juice, soda, coffee, or tea.  Take if you go more than two days without a movement.Do not use MiraLax more than once per day. Call your doctor if you are still constipated or irregular after using this medication for 7 days in a row. ° °If you continue to have problems with postoperative constipation, please contact the office for further assistance and recommendations.  If you experience "the worst abdominal pain ever" or develop nausea or vomiting, please contact the office immediatly for further recommendations for treatment. ° °ITCHING ° If you experience itching with your medications, try taking only a single pain pill, or even half a pain pill at a time.  You can also use Benadryl over the counter for itching or also to help with sleep.  ° °MEDICATIONS °See your medication summary on the “After Visit Summary” that the nursing staff will review with you prior to discharge.  You may have some home  medications which will be placed on hold until you complete the course of blood thinner medication.  It is important for you to complete the blood thinner medication as prescribed by your surgeon.  Continue your approved medications as instructed at time of discharge. ° °PRECAUTIONS °If you experience chest pain or shortness of breath - call 911 immediately for transfer to the hospital emergency department.  °If you develop a fever greater that 101 F, purulent drainage from wound, increased redness or drainage from wound, foul odor from the wound/dressing, or calf pain - CONTACT YOUR SURGEON.   °                                                °FOLLOW-UP APPOINTMENTS °Make sure you keep all of your appointments after your operation with your surgeon and caregivers. You should call the office at the above phone number and make an appointment for approximately two weeks after the date of your surgery or on the   date instructed by your surgeon outlined in the "After Visit Summary". ° °RANGE OF MOTION AND STRENGTHENING EXERCISES  °These exercises are designed to help you keep full movement of your hip joint. Follow your caregiver's or physical therapist's instructions. Perform all exercises about fifteen times, three times per day or as directed. Exercise both hips, even if you have had only one joint replacement. These exercises can be done on a training (exercise) mat, on the floor, on a table or on a bed. Use whatever works the best and is most comfortable for you. Use music or television while you are exercising so that the exercises are a pleasant break in your day. This will make your life better with the exercises acting as a break in routine you can look forward to.  °Lying on your back, slowly slide your foot toward your buttocks, raising your knee up off the floor. Then slowly slide your foot back down until your leg is straight again.  °Lying on your back spread your legs as far apart as you can without causing  discomfort.  °Lying on your side, raise your upper leg and foot straight up from the floor as far as is comfortable. Slowly lower the leg and repeat.  °Lying on your back, tighten up the muscle in the front of your thigh (quadriceps muscles). You can do this by keeping your leg straight and trying to raise your heel off the floor. This helps strengthen the largest muscle supporting your knee.  °Lying on your back, tighten up the muscles of your buttocks both with the legs straight and with the knee bent at a comfortable angle while keeping your heel on the floor.  ° °POST-OPERATIVE OPIOID TAPER INSTRUCTIONS: °It is important to wean off of your opioid medication as soon as possible. If you do not need pain medication after your surgery it is ok to stop day one. °Opioids include: °Codeine, Hydrocodone(Norco, Vicodin), Oxycodone(Percocet, oxycontin) and hydromorphone amongst others.  °Long term and even short term use of opiods can cause: °Increased pain response °Dependence °Constipation °Depression °Respiratory depression °And more.  °Withdrawal symptoms can include °Flu like symptoms °Nausea, vomiting °And more °Techniques to manage these symptoms °Hydrate well °Eat regular healthy meals °Stay active °Use relaxation techniques(deep breathing, meditating, yoga) °Do Not substitute Alcohol to help with tapering °If you have been on opioids for less than two weeks and do not have pain than it is ok to stop all together.  °Plan to wean off of opioids °This plan should start within one week post op of your joint replacement. °Maintain the same interval or time between taking each dose and first decrease the dose.  °Cut the total daily intake of opioids by one tablet each day °Next start to increase the time between doses. °The last dose that should be eliminated is the evening dose.  ° °IF YOU ARE TRANSFERRED TO A SKILLED REHAB FACILITY °If the patient is transferred to a skilled rehab facility following release from the  hospital, a list of the current medications will be sent to the facility for the patient to continue.  When discharged from the skilled rehab facility, please have the facility set up the patient's Home Health Physical Therapy prior to being released. Also, the skilled facility will be responsible for providing the patient with their medications at time of release from the facility to include their pain medication, the muscle relaxants, and their blood thinner medication. If the patient is still at the rehab facility   at time of the two week follow up appointment, the skilled rehab facility will also need to assist the patient in arranging follow up appointment in our office and any transportation needs. ° °MAKE SURE YOU:  °Understand these instructions.  °Get help right away if you are not doing well or get worse.  ° ° °DENTAL ANTIBIOTICS: ° °In most cases prophylactic antibiotics for Dental procdeures after total joint surgery are not necessary. ° °Exceptions are as follows: ° °1. History of prior total joint infection ° °2. Severely immunocompromised (Organ Transplant, cancer chemotherapy, Rheumatoid biologic °meds such as Humera) ° °3. Poorly controlled diabetes (A1C &gt; 8.0, blood glucose over 200) ° °If you have one of these conditions, contact your surgeon for an antibiotic prescription, prior to your °dental procedure.  ° ° °Pick up stool softner and laxative for home use following surgery while on pain medications. °Do not submerge incision under water. °Please use good hand washing techniques while changing dressing each day. °May shower starting three days after surgery. °Please use a clean towel to pat the incision dry following showers. °Continue to use ice for pain and swelling after surgery. °Do not use any lotions or creams on the incision until instructed by your surgeon. ° °

## 2021-06-01 NOTE — Anesthesia Procedure Notes (Deleted)
Spinal  Patient location during procedure: OR Start time: 06/01/2021 1:36 PM End time: 06/01/2021 1:44 PM Reason for block: surgical anesthesia Staffing Performed: anesthesiologist  Anesthesiologist: Pervis Hocking, DO Resident/CRNA: Milford Cage, CRNA Preanesthetic Checklist Completed: patient identified, IV checked, site marked, risks and benefits discussed, surgical consent, monitors and equipment checked, pre-op evaluation and timeout performed Spinal Block Patient position: sitting Prep: ChloraPrep Patient monitoring: heart rate, cardiac monitor, continuous pulse ox and blood pressure Approach: midline Location: L3-4 Injection technique: single-shot Needle Needle type: Pencan  Needle gauge: 24 G Needle length: 10 cm Assessment Sensory level: T6 Events: CSF return and second provider Additional Notes Functioning IV was confirmed and monitors were applied. Sterile prep and drape, including hand hygiene and sterile gloves were used. The patient was positioned and the spine was prepped. The skin was anesthetized with lidocaine.  Free flow of clear CSF was obtained prior to injecting local anesthetic into the CSF.  The spinal needle aspirated freely following injection.  The needle was carefully withdrawn.  The patient tolerated the procedure well.

## 2021-06-01 NOTE — Transfer of Care (Signed)
Immediate Anesthesia Transfer of Care Note  Patient: SHAWNDA Mccarthy  Procedure(s) Performed: TOTAL HIP ARTHROPLASTY ANTERIOR APPROACH (Left: Hip)  Patient Location: PACU  Anesthesia Type:Spinal  Level of Consciousness: awake  Airway & Oxygen Therapy: Patient Spontanous Breathing  Post-op Assessment: Report given to RN and Post -op Vital signs reviewed and stable  Post vital signs: Reviewed and stable  Last Vitals:  Vitals Value Taken Time  BP 110/68 06/01/21 1506  Temp    Pulse 81 06/01/21 1509  Resp 20 06/01/21 1509  SpO2 100 % 06/01/21 1509  Vitals shown include unvalidated device data.  Last Pain:  Vitals:   06/01/21 1156  TempSrc:   PainSc: 0-No pain      Patients Stated Pain Goal: 4 (65/46/50 3546)  Complications: No notable events documented.

## 2021-06-01 NOTE — Anesthesia Preprocedure Evaluation (Addendum)
Anesthesia Evaluation  Patient identified by MRN, date of birth, ID band Patient awake    Reviewed: Allergy & Precautions, NPO status , Patient's Chart, lab work & pertinent test results  Airway Mallampati: III  TM Distance: >3 FB Neck ROM: Full    Dental no notable dental hx. (+) Teeth Intact, Dental Advisory Given   Pulmonary COPD, former smoker,  Quit smoking 1973, 30 pack year history    Pulmonary exam normal breath sounds clear to auscultation       Cardiovascular hypertension (165/69 in preop), Pt. on medications Normal cardiovascular exam Rhythm:Regular Rate:Normal     Neuro/Psych PSYCHIATRIC DISORDERS Anxiety Depression negative neurological ROS     GI/Hepatic Neg liver ROS, GERD  Medicated and Controlled,S/p colon resection 2010   Endo/Other  Hypothyroidism   Renal/GU Renal InsufficiencyRenal diseaseCr 1.17  negative genitourinary   Musculoskeletal  (+) Arthritis , Osteoarthritis,    Abdominal   Peds  Hematology negative hematology ROS (+) hct 38.8, plt 338   Anesthesia Other Findings   Reproductive/Obstetrics negative OB ROS                            Anesthesia Physical Anesthesia Plan  ASA: 2  Anesthesia Plan: MAC and Spinal   Post-op Pain Management:    Induction:   PONV Risk Score and Plan: 2 and Propofol infusion and TIVA  Airway Management Planned: Natural Airway and Simple Face Mask  Additional Equipment: None  Intra-op Plan:   Post-operative Plan:   Informed Consent: I have reviewed the patients History and Physical, chart, labs and discussed the procedure including the risks, benefits and alternatives for the proposed anesthesia with the patient or authorized representative who has indicated his/her understanding and acceptance.     Dental advisory given  Plan Discussed with: CRNA  Anesthesia Plan Comments:        Anesthesia Quick  Evaluation

## 2021-06-01 NOTE — Plan of Care (Signed)
  Problem: Education: Goal: Knowledge of the prescribed therapeutic regimen will improve 06/01/2021 1758 by Jenean Lindau, RN Outcome: Progressing 06/01/2021 1755 by Jenean Lindau, RN Outcome: Progressing 06/01/2021 1755 by Jenean Lindau, RN Outcome: Progressing Goal: Understanding of discharge needs will improve 06/01/2021 1758 by Jenean Lindau, RN Outcome: Progressing 06/01/2021 1755 by Jenean Lindau, RN Outcome: Progressing 06/01/2021 1755 by Jenean Lindau, RN Outcome: Progressing Goal: Individualized Educational Video(s) 06/01/2021 1758 by Jenean Lindau, RN Outcome: Progressing 06/01/2021 1755 by Jenean Lindau, RN Outcome: Progressing 06/01/2021 1755 by Jenean Lindau, RN Outcome: Progressing   Problem: Activity: Goal: Ability to avoid complications of mobility impairment will improve 06/01/2021 1758 by Jenean Lindau, RN Outcome: Progressing 06/01/2021 1755 by Jenean Lindau, RN Outcome: Progressing 06/01/2021 1755 by Jenean Lindau, RN Outcome: Progressing Goal: Ability to tolerate increased activity will improve 06/01/2021 1758 by Jenean Lindau, RN Outcome: Progressing 06/01/2021 1755 by Jenean Lindau, RN Outcome: Progressing 06/01/2021 1755 by Jenean Lindau, RN Outcome: Progressing   Problem: Clinical Measurements: Goal: Postoperative complications will be avoided or minimized 06/01/2021 1758 by Jenean Lindau, RN Outcome: Progressing 06/01/2021 1755 by Jenean Lindau, RN Outcome: Progressing 06/01/2021 1755 by Jenean Lindau, RN Outcome: Progressing   Problem: Pain Management: Goal: Pain level will decrease with appropriate interventions 06/01/2021 1758 by Jenean Lindau, RN Outcome: Progressing 06/01/2021 1755 by Jenean Lindau, RN Outcome: Progressing 06/01/2021 1755 by Jenean Lindau, RN Outcome: Progressing   Problem: Skin Integrity: Goal: Will show  signs of wound healing 06/01/2021 1758 by Jenean Lindau, RN Outcome: Progressing 06/01/2021 1755 by Jenean Lindau, RN Outcome: Progressing 06/01/2021 1755 by Jenean Lindau, RN Outcome: Progressing

## 2021-06-02 ENCOUNTER — Other Ambulatory Visit: Payer: Self-pay

## 2021-06-02 ENCOUNTER — Encounter (HOSPITAL_COMMUNITY): Payer: Self-pay | Admitting: Orthopedic Surgery

## 2021-06-02 DIAGNOSIS — M1612 Unilateral primary osteoarthritis, left hip: Secondary | ICD-10-CM | POA: Diagnosis not present

## 2021-06-02 LAB — CBC
HCT: 31.8 % — ABNORMAL LOW (ref 36.0–46.0)
Hemoglobin: 10 g/dL — ABNORMAL LOW (ref 12.0–15.0)
MCH: 30.7 pg (ref 26.0–34.0)
MCHC: 31.4 g/dL (ref 30.0–36.0)
MCV: 97.5 fL (ref 80.0–100.0)
Platelets: 256 10*3/uL (ref 150–400)
RBC: 3.26 MIL/uL — ABNORMAL LOW (ref 3.87–5.11)
RDW: 14.4 % (ref 11.5–15.5)
WBC: 16.4 10*3/uL — ABNORMAL HIGH (ref 4.0–10.5)
nRBC: 0 % (ref 0.0–0.2)

## 2021-06-02 LAB — BASIC METABOLIC PANEL
Anion gap: 8 (ref 5–15)
BUN: 16 mg/dL (ref 8–23)
CO2: 26 mmol/L (ref 22–32)
Calcium: 9.3 mg/dL (ref 8.9–10.3)
Chloride: 99 mmol/L (ref 98–111)
Creatinine, Ser: 1.08 mg/dL — ABNORMAL HIGH (ref 0.44–1.00)
GFR, Estimated: 51 mL/min — ABNORMAL LOW (ref >60)
Glucose, Bld: 146 mg/dL — ABNORMAL HIGH (ref 70–99)
Potassium: 4 mmol/L (ref 3.5–5.1)
Sodium: 133 mmol/L — ABNORMAL LOW (ref 135–145)

## 2021-06-02 MED ORDER — TRAMADOL HCL 50 MG PO TABS: 50.0000 mg | ORAL_TABLET | Freq: Four times a day (QID) | ORAL | 0 refills | Status: DC | PRN

## 2021-06-02 MED ORDER — HYDROCODONE-ACETAMINOPHEN 5-325 MG PO TABS: 1.0000 | ORAL_TABLET | Freq: Four times a day (QID) | ORAL | 0 refills | Status: DC | PRN

## 2021-06-02 MED ORDER — ASPIRIN 325 MG PO TBEC: 325.0000 mg | DELAYED_RELEASE_TABLET | Freq: Two times a day (BID) | ORAL | 0 refills | Status: AC

## 2021-06-02 MED ORDER — METHOCARBAMOL 500 MG PO TABS: 500.0000 mg | ORAL_TABLET | Freq: Four times a day (QID) | ORAL | 0 refills | Status: DC | PRN

## 2021-06-02 MED ORDER — SODIUM CHLORIDE 0.9 % IV BOLUS
250.0000 mL | Freq: Once | INTRAVENOUS | Status: AC
  Administered 2021-06-02: 250 mL via INTRAVENOUS

## 2021-06-02 NOTE — Progress Notes (Signed)
   Subjective: 1 Day Post-Op Procedure(s) (LRB): TOTAL HIP ARTHROPLASTY ANTERIOR APPROACH (Left) Patient reports pain as mild.   Patient seen in rounds by Dr. Wynelle Link. Patient is well, and has had no acute complaints or problems. States she is ready to go home. No issues overnight, denies chest pain or SOB. Foley catheter removed this AM. We will begin therapy today.   Objective: Vital signs in last 24 hours: Temp:  [97.7 F (36.5 C)-99.5 F (37.5 C)] 99.1 F (37.3 C) (11/01 0511) Pulse Rate:  [80-107] 107 (11/01 0511) Resp:  [11-20] 17 (11/01 0511) BP: (110-165)/(54-79) 144/60 (11/01 0511) SpO2:  [95 %-100 %] 96 % (11/01 0511) Weight:  [73.9 kg] 73.9 kg (10/31 1156)  Intake/Output from previous day:  Intake/Output Summary (Last 24 hours) at 06/02/2021 0718 Last data filed at 06/02/2021 0511 Gross per 24 hour  Intake 2626.88 ml  Output 2200 ml  Net 426.88 ml     Intake/Output this shift: No intake/output data recorded.  Labs: Recent Labs    06/02/21 0304  HGB 10.0*   Recent Labs    06/02/21 0304  WBC 16.4*  RBC 3.26*  HCT 31.8*  PLT 256   Recent Labs    06/02/21 0304  NA 133*  K 4.0  CL 99  CO2 26  BUN 16  CREATININE 1.08*  GLUCOSE 146*  CALCIUM 9.3   No results for input(s): LABPT, INR in the last 72 hours.  Exam: General - Patient is Alert and Oriented Extremity - Neurologically intact Neurovascular intact Sensation intact distally Dorsiflexion/Plantar flexion intact Dressing - dressing C/D/I Motor Function - intact, moving foot and toes well on exam.   Past Medical History:  Diagnosis Date   Anxiety    Arthritis    right hip   COPD (chronic obstructive pulmonary disease) (HCC)    mild   Depression    Diverticular disease    sigmoid   Diverticulitis    Diverticulosis    Gallstones    GERD (gastroesophageal reflux disease)    Hyperlipidemia    Hyperparathyroidism (HCC)    Hypothyroidism    IFG (impaired fasting glucose)     Multiple lipomas    Osteoarthritis    Osteopenia    Retroperitoneal mass    Skin cancer    nose   Vitamin D deficiency     Assessment/Plan: 1 Day Post-Op Procedure(s) (LRB): TOTAL HIP ARTHROPLASTY ANTERIOR APPROACH (Left) Principal Problem:   OA (osteoarthritis) of hip Active Problems:   Primary osteoarthritis of left hip  Estimated body mass index is 26.31 kg/m as calculated from the following:   Height as of this encounter: 5\' 6"  (1.676 m).   Weight as of this encounter: 73.9 kg. Advance diet Up with therapy  DVT Prophylaxis - Aspirin Weight bearing as tolerated. Begin therapy.  Slightly tachycardic this AM (107 bpm most recently) with neutral I/O's. 250 mL bolus ordered.   Plan is to go Home after hospital stay. Plan for discharge today after one session of PT if meeting goals. HEP Follow-up in the office in 2 weeks  The PDMP database was reviewed today prior to any opioid medications being prescribed to this patient.  Theresa Duty, PA-C Orthopedic Surgery [phone] 06/02/2021, 7:18 AM

## 2021-06-02 NOTE — Plan of Care (Signed)
Plan of care reviewed and discussed with the patient. 

## 2021-06-02 NOTE — Progress Notes (Signed)
Physical Therapy Treatment Patient Details Name: Janet Mccarthy MRN: [id number] DOB: April 09, 1935 Today's Date: 06/02/2021   History of Present Illness 86 yo female s/p L DA THA. PMH: R posterior THA 2004, colon resection with tumor removal.    PT Comments    Pt is progressing, meeting PT goals despite reports of elevated pain. Reviewed areas as noted below as well as progression of HEP. Ready to d/c from PT perspective with assist from spouse as needed.  Recommendations for follow up therapy are one component of a multi-disciplinary discharge planning process, led by the attending physician.  Recommendations may be updated based on patient status, additional functional criteria and insurance authorization.  Follow Up Recommendations  Follow physician's recommendations for discharge plan and follow up therapies     Assistance Recommended at Discharge Intermittent Supervision/Assistance  Equipment Recommendations  Rolling walker (2 wheels)    Recommendations for Other Services       Precautions / Restrictions Precautions Precautions: Fall Restrictions Weight Bearing Restrictions: No Other Position/Activity Restrictions: WBAT     Mobility  Bed Mobility               General bed mobility comments: in reclienr; reviewed/demo'd using gai tbelt as leg lifter for in and OOB    Transfers Overall transfer level: Needs assistance Equipment used: Rolling walker (2 wheels) Transfers: Sit to/from Stand Sit to Stand: Min guard           General transfer comment: cues for hand placement and LLE position to decr pain    Ambulation/Gait Ambulation/Gait assistance: Min guard Gait Distance (Feet): 40 Feet Assistive device: Rolling walker (2 wheels) Gait Pattern/deviations: Step-to pattern;Step-through pattern;Decreased stance time - left Gait velocity: decr   General Gait Details: incr time to advance LLE, min-guard for balance and safety   Stairs Stairs:  Yes Stairs assistance: Min guard Stair Management: One rail Right;One rail Left;Step to pattern;Sideways Number of Stairs: 3 General stair comments: verbal cues for sequence and technique, husband present for stair training and education   Wheelchair Mobility    Modified Rankin (Stroke Patients Only)       Balance Overall balance assessment: Needs assistance   Sitting balance-Leahy Scale: Fair     Standing balance support: Reliant on assistive device for balance Standing balance-Leahy Scale: Fair (pt is able to briefly stand without UE support)                              Cognition Arousal/Alertness: Awake/alert Behavior During Therapy: WFL for tasks assessed/performed Overall Cognitive Status: Within Functional Limits for tasks assessed                                          Exercises Total Joint Exercises Ankle Circles/Pumps: AROM;Both;10 reps Quad Sets: AROM;10 reps;Both Heel Slides: 10 reps;Left;AAROM Hip ABduction/ADduction: AROM;AAROM;Left;10 reps    General Comments        Pertinent Vitals/Pain Pain Assessment: 0-10 Pain Score: 7  Pain Location: Left hip Pain Descriptors / Indicators: Aching;Grimacing;Sore;Tightness Pain Intervention(s): Limited activity within patient's tolerance;Monitored during session;Premedicated before session;Repositioned    Home Living Family/patient expects to be discharged to:: Private residence Living Arrangements: Spouse/significant other Available Help at Discharge: Family Type of Home: House Home Access: Stairs to enter   [employer] of Steps: 2 - garage rail/ porch 3 steps with one  rail   Home Layout: One level;Able to live on main level with bedroom/bathroom Home Equipment: Tat Momoli (2 wheels)      Prior Function            PT Goals (current goals can now be found in the care plan section) Acute Rehab PT Goals Patient Stated Goal: have less pain, recover PT  Goal Formulation: With patient Time For Goal Achievement: 06/09/21 Potential to Achieve Goals: Good Progress towards PT goals: Progressing toward goals    Frequency    7X/week      PT Plan Current plan remains appropriate    Co-evaluation              AM-PAC PT "6 Clicks" Mobility   Outcome Measure  Help needed turning from your back to your side while in a flat bed without using bedrails?: A Little Help needed moving from lying on your back to sitting on the side of a flat bed without using bedrails?: A Little Help needed moving to and from a bed to a chair (including a wheelchair)?: A Little Help needed standing up from a chair using your arms (e.g., wheelchair or bedside chair)?: A Little Help needed to walk in hospital room?: A Little Help needed climbing 3-5 steps with a railing? : A Little 6 Click Score: 18    End of Session Equipment Utilized During Treatment: Gait belt Activity Tolerance: Patient tolerated treatment well Patient left: in chair;with call bell/phone within reach;with chair alarm set;with family/visitor present Nurse Communication: Mobility status PT Visit Diagnosis: Other abnormalities of gait and mobility (R26.89);Difficulty in walking, not elsewhere classified (R26.2)     Time: 1437-1500 PT Time Calculation (min) (ACUTE ONLY): 23 min  Charges:  $Gait Training: 23-37 mins                     Baxter Flattery, PT  Acute Rehab Dept ([place]) [phone] Pager [phone]  06/02/2021    [facility] 06/02/2021, 3:14 PM

## 2021-06-02 NOTE — Evaluation (Signed)
Physical Therapy Evaluation Patient Details Name: Janet Mccarthy MRN: [id number] DOB: 08-Feb-1935 Today's Date: 06/02/2021  History of Present Illness  86 yo female s/p L DA THA. PMH: R posterior THA 2004, colon resection with tumor removal.  Clinical Impression  Pt is s/p THA resulting in the deficits listed below (see PT Problem List).  Pt able to amb short distance to hallway, limited by pain. Will see for a second session to advance gait and review stairs/HEP for home.   Pt will benefit from skilled PT to increase their independence and safety with mobility to allow discharge to the venue listed below.         Recommendations for follow up therapy are one component of a multi-disciplinary discharge planning process, led by the attending physician.  Recommendations may be updated based on patient status, additional functional criteria and insurance authorization.  Follow Up Recommendations Follow physician's recommendations for discharge plan and follow up therapies    Assistance Recommended at Discharge    Functional Status Assessment Patient has had a recent decline in their functional status and demonstrates the ability to make significant improvements in function in a reasonable and predictable amount of time.  Equipment Recommendations  Rolling walker (2 wheels)    Recommendations for Other Services       Precautions / Restrictions Precautions Precautions: Fall Restrictions Weight Bearing Restrictions: No Other Position/Activity Restrictions: WBAT      Mobility  Bed Mobility               General bed mobility comments: in recliner    Transfers Overall transfer level: Needs assistance Equipment used: Rolling walker (2 wheels) Transfers: Sit to/from Stand Sit to Stand: Min guard           General transfer comment: cues for hand placement and LLE position to decr pain    Ambulation/Gait Ambulation/Gait assistance: Min guard;Min assist Gait  Distance (Feet): 25 Feet Assistive device: Rolling walker (2 wheels) Gait Pattern/deviations: Step-to pattern;Step-through pattern;Decreased stance time - left Gait velocity: decr   General Gait Details: incr time to advance LLE, min to min/guard for balance and safety  Stairs            Wheelchair Mobility    Modified Rankin (Stroke Patients Only)       Balance Overall balance assessment: Needs assistance   Sitting balance-Leahy Scale: Fair     Standing balance support: Reliant on assistive device for balance Standing balance-Leahy Scale: Fair                               Pertinent Vitals/Pain Pain Assessment: 0-10 Pain Score: 5  Pain Location: Left hip Pain Descriptors / Indicators: Aching;Grimacing;Sore Pain Intervention(s): Premedicated before session;Monitored during session;Limited activity within patient's tolerance;Repositioned    Home Living Family/patient expects to be discharged to:: Private residence Living Arrangements: Spouse/significant other Available Help at Discharge: Family Type of Home: House Home Access: Stairs to enter   [employer] of Steps: 2 - garage rail/ porch 3 steps with one [place]: One level;Able to live on main level with bedroom/bathroom Home Equipment: Rolling Walker (2 wheels)      Prior Function Prior Level of Function : Independent/Modified Independent             Mobility Comments: using RW for ~ 1 month d/t stress fx R--?femur ADLs Comments: independent     Hand Dominance  Extremity/Trunk Assessment   Upper Extremity Assessment Upper Extremity Assessment: Overall WFL for tasks assessed    Lower Extremity Assessment Lower Extremity Assessment: LLE deficits/detail LLE Deficits / Details: ankle WFL, knee and hip grossly 3/5, limited by post op pain LLE: Unable to fully assess due to pain       Communication   Communication: No difficulties  Cognition  Arousal/Alertness: Awake/alert Behavior During Therapy: WFL for tasks assessed/performed Overall Cognitive Status: Within Functional Limits for tasks assessed                                          General Comments      Exercises Total Joint Exercises Ankle Circles/Pumps: AROM;Both;10 reps Quad Sets: AROM;Both;5 reps   Assessment/Plan    PT Assessment Patient needs continued PT services  PT Problem List Decreased strength;Decreased mobility;Decreased range of motion;Decreased activity tolerance;Pain;Decreased knowledge of use of DME       PT Treatment Interventions DME instruction;Therapeutic activities;Gait training;Functional mobility training;Therapeutic exercise;Patient/family education;Stair training    PT Goals (Current goals can be found in the Care Plan section)  Acute Rehab PT Goals Patient Stated Goal: have less pain, recover PT Goal Formulation: With patient Time For Goal Achievement: 06/09/21 Potential to Achieve Goals: Good    Frequency 7X/week   Barriers to discharge        Co-evaluation               AM-PAC PT "6 Clicks" Mobility  Outcome Measure Help needed turning from your back to your side while in a flat bed without using bedrails?: A Little Help needed moving from lying on your back to sitting on the side of a flat bed without using bedrails?: A Little Help needed moving to and from a bed to a chair (including a wheelchair)?: A Little Help needed standing up from a chair using your arms (e.g., wheelchair or bedside chair)?: A Little Help needed to walk in hospital room?: A Little Help needed climbing 3-5 steps with a railing? : A Lot 6 Click Score: 17    End of Session Equipment Utilized During Treatment: Gait belt Activity Tolerance: Patient tolerated treatment well Patient left: in chair;with call bell/phone within reach;with chair alarm set;with family/visitor present   PT Visit Diagnosis: Other abnormalities of  gait and mobility (R26.89);Difficulty in walking, not elsewhere classified (R26.2)    Time: 6333-5456 PT Time Calculation (min) (ACUTE ONLY): 23 min   Charges:   PT Evaluation $PT Eval Low Complexity: 1 Low PT Treatments $Gait Training: 8-22 mins        Baxter Flattery, PT  Acute Rehab Dept ([place]) [phone] Pager [phone]  06/02/2021   [facility] 06/02/2021, 11:56 AM

## 2021-06-02 NOTE — TOC Transition Note (Addendum)
Transition of Care [facility]) - CM/SW Discharge Note   Patient Details  Name: CAMBELL RICKENBACH MRN: [id number] Date of Birth: 1934/11/09  [facility]) CM/SW Contact:  Lennart Pall, LCSW Phone Number: 06/02/2021, 9:19 AM   Clinical Narrative:    Met with pt and spouse and confirming pt has needed DME at home.  Plan for HEP.  No further TOC needs.  ADDENDUM: Alerted by RN/PT that pt has a rollator at home (privately purchased) but needs rolling walker to ensure safety.  Pt agreeable and order placed with [employer] for delivery to room prior to dc today.  Final next level of care: Home/Self Care Barriers to Discharge: No Barriers Identified   Patient Goals and CMS Choice Patient states their goals for this hospitalization and ongoing recovery are:: return home      Discharge Placement                       Discharge Plan and Services                DME Arranged: rolling walker DME Agency: [place] Determinants of Health (SDOH) Interventions     Readmission Risk Interventions No flowsheet data found.

## 2021-06-08 NOTE — Discharge Summary (Signed)
Physician Discharge Summary   Patient ID: Janet Mccarthy MRN: [id number] DOB/AGE: 1934/10/03 86 y.o.  Admit date: 06/01/2021 Discharge date: 06/02/2021  Primary Diagnosis: Osteoarthritis, left hip   Admission Diagnoses:  Past Medical History:  Diagnosis Date   Anxiety    Arthritis    right hip   COPD (chronic obstructive pulmonary disease) (HCC)    mild   Depression    Diverticular disease    sigmoid   Diverticulitis    Diverticulosis    Gallstones    GERD (gastroesophageal reflux disease)    Hyperlipidemia    Hyperparathyroidism ([place])    Hypothyroidism    IFG (impaired fasting glucose)    Multiple lipomas    Osteoarthritis    Osteopenia    Retroperitoneal mass    Skin cancer    nose   Vitamin D deficiency    Discharge Diagnoses:   Principal Problem:   OA (osteoarthritis) of hip Active Problems:   Primary osteoarthritis of left hip  Estimated body mass index is 26.31 kg/m as calculated from the following:   Height as of this encounter: 5\' 6"  (1.676 m).   Weight as of this encounter: 73.9 kg.  Procedure:  Procedure(s) (LRB): TOTAL HIP ARTHROPLASTY ANTERIOR APPROACH (Left)   Consults: None  HPI: Janet Mccarthy is a 86 y.o. female who has advanced end- stage arthritis of their Left  hip with progressively worsening pain and dysfunction.The patient has failed nonoperative management and presents for total hip arthroplasty.   Laboratory Data: Admission on 06/01/2021, Discharged on 06/02/2021  Component Date Value Ref Range Status   ABO/RH(D) 06/01/2021    Final                   Value:O POS Performed at [facility] [address], [place], [place] [address]    WBC 06/02/2021 16.4 (A)  4.0 - 10.5 K/uL Final   RBC 06/02/2021 3.26 (A)  3.87 - 5.11 MIL/uL Final   Hemoglobin 06/02/2021 10.0 (A)  12.0 - 15.0 g/dL Final   HCT 06/02/2021 31.8 (A)  36.0 - 46.0 % Final   MCV 06/02/2021 97.5  80.0 - 100.0 fL Final   MCH 06/02/2021 30.7   26.0 - 34.0 pg Final   MCHC 06/02/2021 31.4  30.0 - 36.0 g/dL Final   RDW 06/02/2021 14.4  11.5 - 15.5 % Final   Platelets 06/02/2021 256  150 - 400 K/uL Final   nRBC 06/02/2021 0.0  0.0 - 0.2 % Final   Performed at [facility], [place] [address], [place], [place] [address]   Sodium 06/02/2021 133 (A)  135 - 145 mmol/L Final   Potassium 06/02/2021 4.0  3.5 - 5.1 mmol/L Final   Chloride 06/02/2021 99  98 - 111 mmol/L Final   CO2 06/02/2021 26  22 - 32 mmol/L Final   Glucose, Bld 06/02/2021 146 (A)  70 - 99 mg/dL Final   Glucose reference range applies only to samples taken after fasting for at least 8 hours.   BUN 06/02/2021 16  8 - 23 mg/dL Final   Creatinine, Ser 06/02/2021 1.08 (A)  0.44 - 1.00 mg/dL Final   Calcium 06/02/2021 9.3  8.9 - 10.3 mg/dL Final   GFR, Estimated 06/02/2021 51 (A)  >60 mL/min Final   Comment: (NOTE) Calculated using the CKD-EPI Creatinine Equation (2021)    Anion gap 06/02/2021 8  5 - 15 Final   Performed at [facility], [place]  [address] [place], [place] [address]  Orders Only on 05/28/2021  Component Date Value Ref Range Status   SARS Coronavirus 2 05/28/2021 RESULT: NEGATIVE   Final   Comment: RESULT: NEGATIVESARS-CoV-2 INTERPRETATION:A NEGATIVE  test result means that SARS-CoV-2 RNA was not present in the specimen above the limit of detection of this test. This does not preclude a possible SARS-CoV-2 infection and should not be used as the  sole basis for patient management decisions. Negative results must be combined with clinical observations, patient history, and epidemiological information. Optimum specimen types and timing for peak viral levels during infections caused by SARS-CoV-2  have not been determined. Collection of multiple specimens or types of specimens may be necessary to detect virus. Improper specimen collection and handling, sequence variability under primers/probes, or organism present below the limit of  detection may  lead to false negative results. Positive and negative predictive values of testing are highly dependent on prevalence. False negative test results are more likely when prevalence of disease is high.The expected result is NEGATIVE.Fact S                          heet for  Healthcare Providers: LocalChronicle.no Sheet for Patients: SalonLookup.es Reference Range - Negative   Hospital Outpatient Visit on 05/20/2021  Component Date Value Ref Range Status   WBC 05/20/2021 10.1  4.0 - 10.5 K/uL Final   RBC 05/20/2021 3.99  3.87 - 5.11 MIL/uL Final   Hemoglobin 05/20/2021 12.1  12.0 - 15.0 g/dL Final   HCT 05/20/2021 38.8  36.0 - 46.0 % Final   MCV 05/20/2021 97.2  80.0 - 100.0 fL Final   MCH 05/20/2021 30.3  26.0 - 34.0 pg Final   MCHC 05/20/2021 31.2  30.0 - 36.0 g/dL Final   RDW 05/20/2021 14.5  11.5 - 15.5 % Final   Platelets 05/20/2021 338  150 - 400 K/uL Final   nRBC 05/20/2021 0.0  0.0 - 0.2 % Final   Performed at [facility], [place] [address] [place], [place] [address]   Sodium 05/20/2021 137  135 - 145 mmol/L Final   Potassium 05/20/2021 4.9  3.5 - 5.1 mmol/L Final   Chloride 05/20/2021 98  98 - 111 mmol/L Final   CO2 05/20/2021 31  22 - 32 mmol/L Final   Glucose, Bld 05/20/2021 113 (A)  70 - 99 mg/dL Final   Glucose reference range applies only to samples taken after fasting for at least 8 hours.   BUN 05/20/2021 14  8 - 23 mg/dL Final   Creatinine, Ser 05/20/2021 1.17 (A)  0.44 - 1.00 mg/dL Final   Calcium 05/20/2021 10.5 (A)  8.9 - 10.3 mg/dL Final   Total Protein 05/20/2021 7.5  6.5 - 8.1 g/dL Final   Albumin 05/20/2021 4.1  3.5 - 5.0 g/dL Final   AST 05/20/2021 14 (A)  15 - 41 U/L Final   ALT 05/20/2021 11  0 - 44 U/L Final   Alkaline Phosphatase 05/20/2021 100  38 - 126 U/L Final   Total Bilirubin 05/20/2021 0.8  0.3 - 1.2 mg/dL Final   GFR, Estimated 05/20/2021 46 (A)  >60 mL/min  Final   Comment: (NOTE) Calculated using the CKD-EPI Creatinine Equation (2021)    Anion gap 05/20/2021 8  5 - 15 Final   Performed at [facility], [place] [address], [place], [place] [address]   ABO/RH(D) 05/20/2021 O POS   Final   Antibody Screen 05/20/2021  NEG   Final   Sample Expiration 05/20/2021 06/04/2021,2359   Final   Extend sample reason 05/20/2021    Final                   Value:NO TRANSFUSIONS OR PREGNANCY IN THE PAST 3 MONTHS Performed at [facility], [place] [address], [place], [place] [address]    Prothrombin Time 05/20/2021 12.7  11.4 - 15.2 seconds Final   INR 05/20/2021 1.0  0.8 - 1.2 Final   Comment: (NOTE) INR goal varies based on device and disease states. Performed at [facility], [place] [address], [place], [place] [address]    MRSA, PCR 05/20/2021 NEGATIVE  NEGATIVE Final   Staphylococcus aureus 05/20/2021 NEGATIVE  NEGATIVE Final   Comment: (NOTE) The Xpert SA Assay (FDA approved for NASAL specimens in patients 92 years of age and older), is one component of a comprehensive surveillance program. It is not intended to diagnose infection nor to guide or monitor treatment. Performed at [facility], [place] [address] [place], [place] [address]      X-Rays:DG Pelvis Portable  Result Date: 06/01/2021 CLINICAL DATA:  Status post left hip replacement. EXAM: PORTABLE PELVIS 1-2 VIEWS COMPARISON:  None. FINDINGS: Left hip arthroplasty in expected alignment. No periprosthetic lucency or fracture. Recent postsurgical change includes air and edema in the soft tissues. Previous right hip arthroplasty is in place. Popcorn calcification in the pelvis likely represent calcified fibroids. IMPRESSION: Left hip arthroplasty without immediate postoperative complication. Electronically Signed   By: Keith Rake M.D.   On: 06/01/2021 15:47   DG C-Arm 1-60 Min-No Report  Result Date:  06/01/2021 Fluoroscopy was utilized by the requesting physician.  No radiographic interpretation.   DG HIP OPERATIVE UNILAT W OR W/O PELVIS LEFT  Result Date: 06/01/2021 CLINICAL DATA:  Left hip replacement EXAM: OPERATIVE LEFT HIP (WITH PELVIS IF PERFORMED) TECHNIQUE: Fluoroscopic spot image(s) were submitted for interpretation post-operatively. COMPARISON:  None. FINDINGS: Two fluoroscopic spot views of the pelvis and left hip obtained in the operating room. Left hip arthroplasty in place. Fluoroscopy time 7 seconds. IMPRESSION: Procedural fluoroscopy for left hip arthroplasty Electronically Signed   By: Keith Rake M.D.   On: 06/01/2021 14:57    EKG: Orders placed or performed in visit on 04/15/21   EKG 12-Lead     Hospital Course: QUANTAVIA FRITH is a 86 y.o. who was admitted to [facility]. They were brought to the operating room on 06/01/2021 and underwent Procedure(s): [place].  Patient tolerated the procedure well and was later transferred to the recovery room and then to the orthopaedic floor for postoperative care. They were given PO and IV analgesics for pain control following their surgery. They were given 24 hours of postoperative antibiotics of  Anti-infectives (From admission, onward)    Start     Dose/Rate Route Frequency Ordered Stop   06/01/21 1930  ceFAZolin (ANCEF) IVPB 2g/100 mL premix        2 g 200 mL/hr over 30 Minutes Intravenous Every 6 hours 06/01/21 1656 06/02/21 0157   06/01/21 1130  ceFAZolin (ANCEF) IVPB 2g/100 mL premix        2 g 200 mL/hr over 30 Minutes Intravenous On call to O.R. 06/01/21 1123 06/01/21 1409      and started on DVT prophylaxis in the form of Aspirin.   PT and OT were ordered for total joint protocol. Discharge planning consulted to help with  postop disposition and equipment needs.  Patient had a good night on the evening of surgery. They started to get up OOB with therapy on POD #1. Pt was  seen during rounds and was ready to go home pending progress with therapy. Noted to be slightly tachycardic with HR around 107 and neutral I/O's, 250 mL bolus ordered with improvement. She worked with therapy on POD #1 and was meeting her goals. Pt was discharged to home later that day in stable condition.  Diet: Regular diet Activity: WBAT Follow-up: in 2 weeks Disposition: Home with OPPT Discharged Condition: stable   Discharge Instructions     Call MD / Call 911   Complete by: As directed    If you experience chest pain or shortness of breath, CALL 911 and be transported to the hospital emergency room.  If you develope a fever above 101 F, pus (white drainage) or increased drainage or redness at the wound, or calf pain, call your surgeon's office.   Change dressing   Complete by: As directed    You have an adhesive waterproof bandage over the incision. Leave this in place until your first follow-up appointment. Once you remove this you will not need to place another bandage.   Constipation Prevention   Complete by: As directed    Drink plenty of fluids.  Prune juice may be helpful.  You may use a stool softener, such as Colace (over the counter) 100 mg twice a day.  Use MiraLax (over the counter) for constipation as needed.   Diet - low sodium heart healthy   Complete by: As directed    Do not sit on low chairs, stoools or toilet seats, as it may be difficult to get up from low surfaces   Complete by: As directed    Driving restrictions   Complete by: As directed    No driving for two weeks   Post-operative opioid taper instructions:   Complete by: As directed    POST-OPERATIVE OPIOID TAPER INSTRUCTIONS: It is important to wean off of your opioid medication as soon as possible. If you do not need pain medication after your surgery it is ok to stop day one. Opioids include: Codeine, Hydrocodone(Norco, Vicodin), Oxycodone(Percocet, oxycontin) and hydromorphone amongst others.  Long  term and even short term use of opiods can cause: Increased pain response Dependence Constipation Depression Respiratory depression And more.  Withdrawal symptoms can include Flu like symptoms Nausea, vomiting And more Techniques to manage these symptoms Hydrate well Eat regular healthy meals Stay active Use relaxation techniques(deep breathing, meditating, yoga) Do Not substitute Alcohol to help with tapering If you have been on opioids for less than two weeks and do not have pain than it is ok to stop all together.  Plan to wean off of opioids This plan should start within one week post op of your joint replacement. Maintain the same interval or time between taking each dose and first decrease the dose.  Cut the total daily intake of opioids by one tablet each day Next start to increase the time between doses. The last dose that should be eliminated is the evening dose.      TED hose   Complete by: As directed    Use stockings (TED hose) for three weeks on both leg(s).  You may remove them at night for sleeping.   Weight bearing as tolerated   Complete by: As directed       Allergies as of 06/02/2021  Reactions   Sulfa Antibiotics Itching        Medication List     TAKE these medications    amitriptyline 50 MG tablet Commonly known as: ELAVIL Take 50 mg by mouth daily.   aspirin 325 MG EC tablet Take 1 tablet (325 mg total) by mouth 2 (two) times daily for 20 days. Then take one 81 mg aspirin once a day for three weeks. Then discontinue aspirin.   clobetasol cream 0.05 % Commonly known as: TEMOVATE Apply 1 application topically daily as needed (irritation).   hydrochlorothiazide 12.5 MG capsule Commonly known as: MICROZIDE Take 12.5 mg by mouth daily.   HYDROcodone-acetaminophen 5-325 MG tablet Commonly known as: NORCO/VICODIN Take 1-2 tablets by mouth every 6 (six) hours as needed for severe pain.   levothyroxine 75 MCG tablet Commonly known  as: SYNTHROID Take 75 mcg by mouth daily before breakfast.   methocarbamol 500 MG tablet Commonly known as: ROBAXIN Take 1 tablet (500 mg total) by mouth every 6 (six) hours as needed for muscle spasms.   pantoprazole 40 MG tablet Commonly known as: PROTONIX TAKE 1 TABLET ONCE DAILY.   Pfizer COVID-19 Vac Bivalent injection Generic drug: COVID-19 mRNA bivalent vaccine Therapist, music) Inject into the muscle.   traMADol 50 MG tablet Commonly known as: ULTRAM Take 1-2 tablets (50-100 mg total) by mouth every 6 (six) hours as needed for moderate pain.               Discharge Care Instructions  (From admission, onward)           Start     Ordered   06/02/21 0000  Weight bearing as tolerated        06/02/21 0722   06/02/21 0000  Change dressing       Comments: You have an adhesive waterproof bandage over the incision. Leave this in place until your first follow-up appointment. Once you remove this you will not need to place another bandage.   06/02/21 6440            Follow-up Information     Gaynelle Arabian, MD Follow up in 2 week(s).   Specialty: Orthopedic Surgery Contact information: [address] [place] [address] [phone]                 Signed: Theresa Duty, PA-C Orthopedic Surgery 06/08/2021, 7:46 AM

## 2021-08-03 ENCOUNTER — Other Ambulatory Visit: Payer: Self-pay | Admitting: Internal Medicine

## 2021-10-29 ENCOUNTER — Other Ambulatory Visit: Payer: Self-pay | Admitting: Internal Medicine

## 2021-11-04 ENCOUNTER — Ambulatory Visit: Payer: Medicare Other | Admitting: Physician Assistant

## 2021-11-18 NOTE — Progress Notes (Signed)
? ? ?11/19/2021 ?ZYLPHA POYNOR ?[id number] ?03/26/1935 ? ?Referring provider: Crist Infante, MD ?Primary GI doctor: Dr. [name] ? ?ASSESSMENT AND PLAN:  ? ?Gastroesophageal reflux disease, unspecified whether esophagitis present with hiatal hernia ?-     pantoprazole (PROTONIX) 40 MG tablet; Take 1 tablet (40 mg total) by mouth daily. Office visit for further refills ?Well controlled ?Will follow up if any further issues/dysphagia ?Information given to the patient ? ?Rectal bleeding ?Has long standing history, declines colonoscopy ?Will follow up if any worsening symptoms ?Information given to the patient ? ?History of Present Illness:  ?87 y.o. female  with a past medical history of hypothyroidism, hyperparathyroidism, vitamin D deficiency, GERD complicated by peptic stricture requiring esophageal dilatation and others listed below, returns to clinic today for GERD medications.  ? ?06/08/2018 upper endoscopy GERD, esophageal stricture status post dilatation, moderate sized hiatal hernia ?11/06/2014 upper endoscopy GERD with pyloric stricture status post dilatation ?11/02/2012 MRCP ? ?Patient is on Protonix 40 mg once daily. Gerd well controlled.  ?She denies dysphagia, melena, AB pain.  ?She denies nocturnal symptoms.   ?No unintentional weight loss ?She has BRB on TP with BM occ with straining.  ?No colonoscopy in the past 10 years, intermittent rectal bleeding attributed to hemorrhoids.  Patient reluctant to undergo colonoscopy.  Patient does have history of colon resection 2010 secondary to diverticular disease ? ?Current Medications:  ? ?Current Outpatient Medications (Endocrine & Metabolic):  ?  levothyroxine (SYNTHROID) 75 MCG tablet, Take 75 mcg by mouth daily before breakfast. ? ?Current Outpatient Medications (Cardiovascular):  ?  hydrochlorothiazide (MICROZIDE) 12.5 MG capsule, Take 12.5 mg by mouth daily. ? ? ?Current Outpatient Medications (Analgesics):  ?  traMADol (ULTRAM) 50 MG tablet, Take 1-2  tablets (50-100 mg total) by mouth every 6 (six) hours as needed for moderate pain. ? ? ?Current Outpatient Medications (Other):  ?  amitriptyline (ELAVIL) 50 MG tablet, Take 50 mg by mouth daily. ?  clobetasol cream (TEMOVATE) 8.31 %, Apply 1 application topically daily as needed (irritation). ?  COVID-19 mRNA bivalent vaccine, Pfizer, (PFIZER COVID-19 VAC BIVALENT) injection, Inject into the muscle. ?  pantoprazole (PROTONIX) 40 MG tablet, Take 1 tablet (40 mg total) by mouth daily. Office visit for further refills ? ?Surgical History:  ?She  has a past surgical history that includes Tonsillectomy and adenoidectomy; Total hip arthroplasty (2004); Colon resection (2010); Colonoscopy with esophagogastroduodenoscopy (egd) (06/2018); and Total hip arthroplasty (Left, 06/01/2021). ?Family History:  ?Her family history is not on file. She was adopted. ?Social History:  ? reports that she quit smoking about 50 years ago. Her smoking use included cigarettes. She has a 30.00 pack-year smoking history. She has never used smokeless tobacco. She reports that she does not drink alcohol and does not use drugs. ? ?Current Medications, Allergies, Past Medical History, Past Surgical History, Family History and Social History were reviewed in [employer] record. ? ?Physical Exam: ?BP 110/60   Pulse 86   Ht '5\' 6"'$  (1.676 m)   Wt 169 lb (76.7 kg)   BMI 27.28 kg/m?  ?General:   Pleasant, well developed female in no acute distress ?Heart:  Regular rate and rhythm; no murmurs ?Pulm: Clear anteriorly; no wheezing ?Abdomen:  Soft, Obese AB, Active bowel sounds. No tenderness . Without guarding and Without rebound, No organomegaly appreciated. ?Extremities:  without  edema. Walks with cane.  ?Neurologic:  Alert and  oriented x4;  No focal deficits.  ?Psych:  Cooperative. Normal mood and affect. ? ? ?  Vladimir Crofts, PA-C ?11/19/21 ?

## 2021-11-19 ENCOUNTER — Encounter: Payer: Self-pay | Admitting: Physician Assistant

## 2021-11-19 ENCOUNTER — Ambulatory Visit: Payer: Medicare Other | Admitting: Physician Assistant

## 2021-11-19 VITALS — BP 110/60 | HR 86 | Ht 66.0 in | Wt 169.0 lb

## 2021-11-19 DIAGNOSIS — K219 Gastro-esophageal reflux disease without esophagitis: Secondary | ICD-10-CM

## 2021-11-19 DIAGNOSIS — K449 Diaphragmatic hernia without obstruction or gangrene: Secondary | ICD-10-CM

## 2021-11-19 MED ORDER — PANTOPRAZOLE SODIUM 40 MG PO TBEC: 40.0000 mg | DELAYED_RELEASE_TABLET | Freq: Every day | ORAL | 3 refills | Status: DC

## 2021-11-19 NOTE — Patient Instructions (Signed)
Will refill medication ?Does not need to return until you start to have trouble swallowing, worsening rectal bleeding, AB pain, nausea, vomiting.  ? ?Please take your proton pump inhibitor medication 30 minutes to 1 hour before meals- this makes it more effective.  ?Avoid spicy and acidic foods ?Avoid fatty foods ?Limit your intake of coffee, tea, alcohol, and carbonated drinks ?Work to maintain a healthy weight ?Keep the head of the bed elevated at least 3 inches with blocks or a wedge pillow if you are having any nighttime symptoms ?Stay upright for 2 hours after eating ?Avoid meals and snacks three to four hours before bedtime ?

## 2021-11-19 NOTE — Progress Notes (Signed)
Noted  

## 2022-01-27 ENCOUNTER — Inpatient Hospital Stay (HOSPITAL_COMMUNITY)
Admission: EM | Admit: 2022-01-27 | Discharge: 2022-01-31 | DRG: 872 | Disposition: A | Discharge status: Home / Self Care | Payer: Medicare Other | Attending: Internal Medicine | Admitting: Internal Medicine

## 2022-01-27 ENCOUNTER — Encounter (HOSPITAL_COMMUNITY): Payer: Self-pay

## 2022-01-27 ENCOUNTER — Inpatient Hospital Stay (HOSPITAL_COMMUNITY): Payer: Medicare Other

## 2022-01-27 ENCOUNTER — Other Ambulatory Visit: Payer: Self-pay

## 2022-01-27 DIAGNOSIS — K219 Gastro-esophageal reflux disease without esophagitis: Secondary | ICD-10-CM | POA: Diagnosis present

## 2022-01-27 DIAGNOSIS — Z96643 Presence of artificial hip joint, bilateral: Secondary | ICD-10-CM | POA: Diagnosis present

## 2022-01-27 DIAGNOSIS — E871 Hypo-osmolality and hyponatremia: Secondary | ICD-10-CM

## 2022-01-27 DIAGNOSIS — Z85828 Personal history of other malignant neoplasm of skin: Secondary | ICD-10-CM

## 2022-01-27 DIAGNOSIS — I714 Abdominal aortic aneurysm, without rupture, unspecified: Secondary | ICD-10-CM | POA: Diagnosis present

## 2022-01-27 DIAGNOSIS — E875 Hyperkalemia: Secondary | ICD-10-CM | POA: Diagnosis present

## 2022-01-27 DIAGNOSIS — J449 Chronic obstructive pulmonary disease, unspecified: Secondary | ICD-10-CM | POA: Diagnosis present

## 2022-01-27 DIAGNOSIS — Z20822 Contact with and (suspected) exposure to covid-19: Secondary | ICD-10-CM | POA: Diagnosis present

## 2022-01-27 DIAGNOSIS — E213 Hyperparathyroidism, unspecified: Secondary | ICD-10-CM | POA: Diagnosis present

## 2022-01-27 DIAGNOSIS — I129 Hypertensive chronic kidney disease with stage 1 through stage 4 chronic kidney disease, or unspecified chronic kidney disease: Secondary | ICD-10-CM | POA: Diagnosis present

## 2022-01-27 DIAGNOSIS — F32A Depression, unspecified: Secondary | ICD-10-CM | POA: Diagnosis present

## 2022-01-27 DIAGNOSIS — N179 Acute kidney failure, unspecified: Secondary | ICD-10-CM | POA: Diagnosis present

## 2022-01-27 DIAGNOSIS — R1314 Dysphagia, pharyngoesophageal phase: Secondary | ICD-10-CM | POA: Diagnosis present

## 2022-01-27 DIAGNOSIS — E785 Hyperlipidemia, unspecified: Secondary | ICD-10-CM | POA: Diagnosis present

## 2022-01-27 DIAGNOSIS — R1319 Other dysphagia: Secondary | ICD-10-CM | POA: Diagnosis present

## 2022-01-27 DIAGNOSIS — N1831 Chronic kidney disease, stage 3a: Secondary | ICD-10-CM | POA: Diagnosis present

## 2022-01-27 DIAGNOSIS — A419 Sepsis, unspecified organism: Secondary | ICD-10-CM | POA: Diagnosis not present

## 2022-01-27 DIAGNOSIS — Z881 Allergy status to other antibiotic agents status: Secondary | ICD-10-CM

## 2022-01-27 DIAGNOSIS — N12 Tubulo-interstitial nephritis, not specified as acute or chronic: Secondary | ICD-10-CM | POA: Diagnosis present

## 2022-01-27 DIAGNOSIS — E861 Hypovolemia: Secondary | ICD-10-CM | POA: Diagnosis present

## 2022-01-27 DIAGNOSIS — A4151 Sepsis due to Escherichia coli [E. coli]: Secondary | ICD-10-CM | POA: Diagnosis present

## 2022-01-27 DIAGNOSIS — Z79890 Hormone replacement therapy (postmenopausal): Secondary | ICD-10-CM

## 2022-01-27 DIAGNOSIS — Z79899 Other long term (current) drug therapy: Secondary | ICD-10-CM

## 2022-01-27 DIAGNOSIS — R6520 Severe sepsis without septic shock: Secondary | ICD-10-CM | POA: Insufficient documentation

## 2022-01-27 DIAGNOSIS — F419 Anxiety disorder, unspecified: Secondary | ICD-10-CM | POA: Diagnosis present

## 2022-01-27 DIAGNOSIS — E039 Hypothyroidism, unspecified: Secondary | ICD-10-CM | POA: Diagnosis present

## 2022-01-27 DIAGNOSIS — A4150 Gram-negative sepsis, unspecified: Secondary | ICD-10-CM | POA: Diagnosis not present

## 2022-01-27 DIAGNOSIS — B9620 Unspecified Escherichia coli [E. coli] as the cause of diseases classified elsewhere: Secondary | ICD-10-CM

## 2022-01-27 DIAGNOSIS — M169 Osteoarthritis of hip, unspecified: Secondary | ICD-10-CM | POA: Diagnosis present

## 2022-01-27 DIAGNOSIS — E876 Hypokalemia: Secondary | ICD-10-CM

## 2022-01-27 DIAGNOSIS — Z87891 Personal history of nicotine dependence: Secondary | ICD-10-CM

## 2022-01-27 DIAGNOSIS — D72829 Elevated white blood cell count, unspecified: Secondary | ICD-10-CM

## 2022-01-27 DIAGNOSIS — Z888 Allergy status to other drugs, medicaments and biological substances status: Secondary | ICD-10-CM

## 2022-01-27 DIAGNOSIS — R7881 Bacteremia: Secondary | ICD-10-CM

## 2022-01-27 DIAGNOSIS — N390 Urinary tract infection, site not specified: Principal | ICD-10-CM

## 2022-01-27 LAB — CBC
HCT: 37.9 % (ref 36.0–46.0)
Hemoglobin: 12 g/dL (ref 12.0–15.0)
MCH: 29.9 pg (ref 26.0–34.0)
MCHC: 31.7 g/dL (ref 30.0–36.0)
MCV: 94.5 fL (ref 80.0–100.0)
Platelets: 243 10*3/uL (ref 150–400)
RBC: 4.01 MIL/uL (ref 3.87–5.11)
RDW: 15.4 % (ref 11.5–15.5)
WBC: 25.3 10*3/uL — ABNORMAL HIGH (ref 4.0–10.5)
nRBC: 0 % (ref 0.0–0.2)

## 2022-01-27 LAB — URINALYSIS, ROUTINE W REFLEX MICROSCOPIC
Bilirubin Urine: NEGATIVE
Glucose, UA: NEGATIVE mg/dL
Ketones, ur: NEGATIVE mg/dL
Nitrite: NEGATIVE
Protein, ur: 30 mg/dL — AB
Specific Gravity, Urine: 1.016 (ref 1.005–1.030)
WBC, UA: >50 WBC/hpf — ABNORMAL HIGH (ref 0–5)
pH: 5 (ref 5.0–8.0)

## 2022-01-27 LAB — RESPIRATORY PANEL BY PCR

## 2022-01-27 LAB — BASIC METABOLIC PANEL
Anion gap: 12 (ref 5–15)
BUN: 35 mg/dL — ABNORMAL HIGH (ref 8–23)
CO2: 27 mmol/L (ref 22–32)
Calcium: 9.9 mg/dL (ref 8.9–10.3)
Chloride: 93 mmol/L — ABNORMAL LOW (ref 98–111)
Creatinine, Ser: 1.61 mg/dL — ABNORMAL HIGH (ref 0.44–1.00)
GFR, Estimated: 31 mL/min — ABNORMAL LOW (ref >60)
Glucose, Bld: 140 mg/dL — ABNORMAL HIGH (ref 70–99)
Potassium: 2.9 mmol/L — ABNORMAL LOW (ref 3.5–5.1)
Sodium: 132 mmol/L — ABNORMAL LOW (ref 135–145)

## 2022-01-27 LAB — MAGNESIUM: Magnesium: 1.9 mg/dL (ref 1.7–2.4)

## 2022-01-27 LAB — SARS CORONAVIRUS 2 BY RT PCR: SARS Coronavirus 2 by RT PCR: NEGATIVE

## 2022-01-27 LAB — LACTIC ACID, PLASMA
Lactic Acid, Venous: 1.9 mmol/L (ref 0.5–1.9)
Lactic Acid, Venous: 1.2 mmol/L (ref 0.5–1.9)

## 2022-01-27 MED ORDER — POTASSIUM CHLORIDE 20 MEQ PO PACK
60.0000 meq | PACK | Freq: Two times a day (BID) | ORAL | Status: DC
  Administered 2022-01-27 (×2): 60 meq via ORAL
  Filled 2022-01-27 (×2): qty 3

## 2022-01-27 MED ORDER — PANTOPRAZOLE SODIUM 40 MG IV SOLR
40.0000 mg | Freq: Every day | INTRAVENOUS | Status: DC
  Administered 2022-01-27 – 2022-01-29 (×3): 40 mg via INTRAVENOUS
  Filled 2022-01-27 (×3): qty 10

## 2022-01-27 MED ORDER — ONDANSETRON HCL 4 MG/2ML IJ SOLN: 4.0000 mg | Freq: Four times a day (QID) | INTRAMUSCULAR | Status: DC | PRN

## 2022-01-27 MED ORDER — ACETAMINOPHEN 325 MG PO TABS
650.0000 mg | ORAL_TABLET | Freq: Four times a day (QID) | ORAL | Status: DC | PRN
  Administered 2022-01-27 – 2022-01-28 (×2): 650 mg via ORAL
  Filled 2022-01-27 (×2): qty 2

## 2022-01-27 MED ORDER — SODIUM CHLORIDE 0.9% FLUSH
3.0000 mL | Freq: Two times a day (BID) | INTRAVENOUS | Status: DC
  Administered 2022-01-27 – 2022-01-30 (×7): 3 mL via INTRAVENOUS

## 2022-01-27 MED ORDER — SODIUM CHLORIDE 0.9 % IV SOLN
1.0000 g | Freq: Once | INTRAVENOUS | Status: AC
  Administered 2022-01-27: 1 g via INTRAVENOUS
  Filled 2022-01-27: qty 10

## 2022-01-27 MED ORDER — ONDANSETRON HCL 4 MG PO TABS: 4.0000 mg | ORAL_TABLET | Freq: Four times a day (QID) | ORAL | Status: DC | PRN

## 2022-01-27 MED ORDER — ACETAMINOPHEN 650 MG RE SUPP: 650.0000 mg | Freq: Four times a day (QID) | RECTAL | Status: DC | PRN

## 2022-01-27 MED ORDER — LEVOTHYROXINE SODIUM 75 MCG PO TABS
75.0000 ug | ORAL_TABLET | Freq: Every day | ORAL | Status: DC
  Administered 2022-01-28 – 2022-01-31 (×4): 75 ug via ORAL
  Filled 2022-01-27 (×4): qty 1

## 2022-01-27 MED ORDER — POTASSIUM CHLORIDE IN NACL 20-0.9 MEQ/L-% IV SOLN
INTRAVENOUS | Status: DC
  Filled 2022-01-27 (×2): qty 1000

## 2022-01-27 MED ORDER — ENOXAPARIN SODIUM 30 MG/0.3ML IJ SOSY
30.0000 mg | PREFILLED_SYRINGE | INTRAMUSCULAR | Status: DC
  Administered 2022-01-27 – 2022-01-30 (×4): 30 mg via SUBCUTANEOUS
  Filled 2022-01-27 (×4): qty 0.3

## 2022-01-27 MED ORDER — SODIUM CHLORIDE 0.9 % IV SOLN: 2.0000 g | INTRAVENOUS | Status: DC

## 2022-01-27 MED ORDER — LACTATED RINGERS IV BOLUS
500.0000 mL | Freq: Once | INTRAVENOUS | Status: AC
  Administered 2022-01-27: 500 mL via INTRAVENOUS

## 2022-01-27 MED ORDER — SENNOSIDES-DOCUSATE SODIUM 8.6-50 MG PO TABS: 1.0000 | ORAL_TABLET | Freq: Every evening | ORAL | Status: DC | PRN

## 2022-01-27 MED ORDER — AMITRIPTYLINE HCL 25 MG PO TABS
50.0000 mg | ORAL_TABLET | Freq: Every day | ORAL | Status: DC
  Administered 2022-01-27 – 2022-01-30 (×4): 50 mg via ORAL
  Filled 2022-01-27 (×4): qty 2

## 2022-01-27 NOTE — Hospital Course (Addendum)
87 year old female with past medical history of hypertension, hypothyroidism, CKD stage III AAA baseline creatinine 1.0, previous leukocytosis 16 K 06/02/2021, presents for evaluation of fever for last 4 to 5 days , poor oral intake. She was seen by PCP diagnosed with UTI given normal saline 1 g Rocephin and sent to the ED for urosepsis. UA with WBC more than 50, labs with leukocytosis 25 K, severe hypokalemia, hyponatremia, creatinine 1.6.  Received additional 1 g ceftriaxone.  Treated with 60 mEq x 2 ordered.  Blood culture sent.  Lactic acid ordered and admission requested for further management. Patient remained hemodynamic stable overnight, blood culture with 2/4 bottle anaerobic w E. Coli in BCID, urine culture also grew E. Coli.  Patient is remarkably improved with IV antibiotics AKI is resolved leukocytosis almost normalized.  Mental status stable.PT requested for disposition.  Once culture resulted she will be discharged home.Urine culture w/ e coli sensitive to  cefazolin, rocephin, bactrim.

## 2022-01-27 NOTE — H&P (Signed)
History and Physical    Janet Mccarthy NFA:[id number] DOB: Mar 17, 1935 DOA: 01/27/2022  PCP: Crist Infante, MD   Patient coming from:   Chief Complaint  Patient presents with   Urinary Tract Infection     HPI:87 year old female with past medical history of hypertension, hypothyroidism, CKD stage III AAA baseline creatinine 1.0, previous leukocytosis 16 K 06/02/2021, presents for evaluation of fever for last 4 to 5 days , poor oral intake. She was seen by PCP diagnosed with UTI given normal saline 1 g Rocephin and sent to the ED for urosepsis. UA with WBC more than 50, labs with leukocytosis 25 K, severe hypokalemia, hyponatremia, creatinine 1.6.  Received additional 1 g ceftriaxone.  Treated with 60 mEq x 2 ordered.  Blood culture sent.  Lactic acid ordered and admission requested for further management. She had had temo upto 103 at home, has had vomiting on Sunday and Monday- NBNB.  She has had urinary frequency. Patient otherwise denies any nausea, chest pain, cough, shortness of breath,headache, diarrhea, focal weakness, numbness tingling, speech difficulties, sore throat or running nose. Had fever again in ED at 103. She was last admitted in hospital for hip surgery last October.  Assessment/Plan Principal Problem:   Severe sepsis (HCC) Active Problems:   Esophageal dysphagia   OA (osteoarthritis) of hip   Sepsis ([place])   Hypokalemia   Acute renal failure superimposed on stage 3a chronic kidney disease (HCC)   Leucocytosis   Hyponatremia  Severe sepsis due to UTI POA: Continue ceftriaxone 2 g follow-up blood and urine culture, continue hydration monitor vitals. BP is stable, lactic acid normal. Get cxr for completeness.Covid/flu screen pending.  Severe hypokalemia will be repleted aggressively PIS and IV, check mag,  History of esophageal dysphagia-none currently per patient Decreased oral intake: Add PPI,dietitian consult  Acute renal failure superimposed on stage 3a chronic  kidney disease: Baseline creatinine 1.4 increased to 1.6 in the setting of poor oral intake severe sepsis, add IV fluid hydration avoid hypotension repeat labs in the morning monitor urine output  ION:[id number] HCTZ. Bp stable. Bp one time was transiently 77/49. Now In 160s.  Leucocytosis: Leukocytosis of 16,000 in November, currently high likely from sepsis.  Monitor  Hyponatremia: Likely from poor oral intake continue IV hydration repeat labs  There is no height or weight on file to calculate BMI.   Severity of Illness: The appropriate patient status for this patient is INPATIENT. Inpatient status is judged to be reasonable and necessary in order to provide the required intensity of service to ensure the patient's safety. The patient's presenting symptoms, physical exam findings, and initial radiographic and laboratory data in the context of their chronic comorbidities is felt to place them at high risk for further clinical deterioration. Furthermore, it is not anticipated that the patient will be medically stable for discharge from the hospital within 2 midnights of admission.   * I certify that at the point of admission it is my clinical judgment that the patient will require inpatient hospital care spanning beyond 2 midnights from the point of admission due to high intensity of service, high risk for further deterioration and high frequency of surveillance required.*   DVT prophylaxis: enoxaparin (LOVENOX) injection 30 mg Start: 01/27/22 1615  Code Status:   Code Status: Full Code  Family Communication: Admission, patients condition and plan of care including tests being ordered have been discussed with the patient and her husband who indicate understanding and agree with the plan and Code Status.  Consults called:  none  Review of Systems: All systems were reviewed and were negative except as mentioned in HPI above. Negative for focal weakness Negative for chest pain Negative for  shortness of breath  Past Medical History:  Diagnosis Date   Anxiety    Arthritis    right hip   COPD (chronic obstructive pulmonary disease) (HCC)    mild   Depression    Diverticular disease    sigmoid   Diverticulitis    Diverticulosis    Gallstones    GERD (gastroesophageal reflux disease)    Hyperlipidemia    Hyperparathyroidism ([place])    Hypothyroidism    IFG (impaired fasting glucose)    Multiple lipomas    Osteoarthritis    Osteopenia    Retroperitoneal mass    Skin cancer    nose   Vitamin D deficiency    Past Surgical History:  Procedure Laterality Date   COLON RESECTION  2010   with tumor removal   COLONOSCOPY WITH ESOPHAGOGASTRODUODENOSCOPY (EGD)  06/2018   TONSILLECTOMY AND ADENOIDECTOMY     TOTAL HIP ARTHROPLASTY  2004   right   TOTAL HIP ARTHROPLASTY Left 06/01/2021   Procedure: TOTAL HIP ARTHROPLASTY ANTERIOR APPROACH;  Surgeon: Gaynelle Arabian, MD;  Location: [facility];  Service: Orthopedics;  Laterality: Left;     reports that she quit smoking about 50 years ago. Her smoking use included cigarettes. She has a 30.00 pack-year smoking history. She has never used smokeless tobacco. She reports that she does not drink alcohol and does not use drugs.  Allergies  Allergen Reactions   Sulfa Antibiotics Itching    Family History  Adopted: Yes     Prior to Admission medications   Medication Sig Start Date End Date Taking? Authorizing Provider  amitriptyline (ELAVIL) 50 MG tablet Take 50 mg by mouth daily. 02/23/17   [provider]  clobetasol cream (TEMOVATE) 6.81 % Apply 1 application topically daily as needed (irritation). 09/23/14   [provider]  COVID-19 mRNA bivalent vaccine, Pfizer, (PFIZER COVID-19 VAC BIVALENT) injection Inject into the muscle. 05/15/21   Carlyle Basques, MD  hydrochlorothiazide (MICROZIDE) 12.5 MG capsule Take 12.5 mg by mouth daily.    [provider]  levothyroxine (SYNTHROID) 75 MCG tablet Take 75  mcg by mouth daily before breakfast. 02/20/21   [provider]  pantoprazole (PROTONIX) 40 MG tablet Take 1 tablet (40 mg total) by mouth daily. Office visit for further refills 11/19/21   Vladimir Crofts, PA-C  traMADol (ULTRAM) 50 MG tablet Take 1-2 tablets (50-100 mg total) by mouth every 6 (six) hours as needed for moderate pain. 06/02/21   Derl Barrow, PA    Physical Exam: Vitals:   01/27/22 1500 01/27/22 1515 01/27/22 1530 01/27/22 1554  BP: (!) 129/111 (!) 155/73 (!) 170/61   Pulse: (!) 112 (!) 109 (!) 116   Resp: 11 15 (!) 29   Temp:    (!) 103 F (39.4 C)  TempSrc:      SpO2: 96% 95% 100%     General exam: AAOx3,NAD,weak appearing. HEENT:Oral mucosa moist, Ear/Nose WNL grossly, dentition normal. Respiratory system: bilaterally clear,no wheezing or crackles,no use of accessory muscle Cardiovascular system: S1 & S2 +, No JVD,. Gastrointestinal system: Abdomen soft, NT,ND, BS+ Nervous System:Alert, awake, moving extremities and grossly nonfocal Extremities: No edema, distal peripheral pulses palpable.  Skin: No rashes,no icterus. MSK: Normal muscle bulk,tone, power   Labs on Admission: I have personally reviewed following labs and  imaging studies  CBC: Recent Labs  Lab 01/27/22 1258  WBC 25.3*  HGB 12.0  HCT 37.9  MCV 94.5  PLT 433   Basic Metabolic Panel: Recent Labs  Lab 01/27/22 1258  NA 132*  K 2.9*  CL 93*  CO2 27  GLUCOSE 140*  BUN 35*  CREATININE 1.61*  CALCIUM 9.9   GFR: CrCl cannot be calculated (Unknown ideal weight.). Liver Function Tests: No results for input(s): "AST", "ALT", "ALKPHOS", "BILITOT", "PROT", "ALBUMIN" in the last 168 hours. No results for input(s): "LIPASE", "AMYLASE" in the last 168 hours. No results for input(s): "AMMONIA" in the last 168 hours. Coagulation Profile: No results for input(s): "INR", "PROTIME" in the last 168 hours. Cardiac Enzymes: No results for input(s): "CKTOTAL", "CKMB", "CKMBINDEX",  "TROPONINI" in the last 168 hours. BNP (last 3 results) No results for input(s): "PROBNP" in the last 8760 hours. HbA1C: No results for input(s): "HGBA1C" in the last 72 hours. CBG: No results for input(s): "GLUCAP" in the last 168 hours. Lipid Profile: No results for input(s): "CHOL", "HDL", "LDLCALC", "TRIG", "CHOLHDL", "LDLDIRECT" in the last 72 hours. Thyroid Function Tests: No results for input(s): "TSH", "T4TOTAL", "FREET4", "T3FREE", "THYROIDAB" in the last 72 hours. Anemia Panel: No results for input(s): "VITAMINB12", "FOLATE", "FERRITIN", "TIBC", "IRON", "RETICCTPCT" in the last 72 hours. Urine analysis:    Component Value Date/Time   COLORURINE YELLOW 01/27/2022 1322   APPEARANCEUR CLOUDY (A) 01/27/2022 1322   LABSPEC 1.016 01/27/2022 1322   PHURINE 5.0 01/27/2022 1322   GLUCOSEU NEGATIVE 01/27/2022 1322   HGBUR SMALL (A) 01/27/2022 1322   BILIRUBINUR NEGATIVE 01/27/2022 1322   KETONESUR NEGATIVE 01/27/2022 1322   PROTEINUR 30 (A) 01/27/2022 1322   NITRITE NEGATIVE 01/27/2022 1322   LEUKOCYTESUR LARGE (A) 01/27/2022 1322    Radiological Exams on Admission: No results found.    Antonieta Pert MD [facility]  If 7PM-7AM, please contact night-coverage [email or web address]  01/27/2022, 4:13 PM

## 2022-01-27 NOTE — ED Triage Notes (Signed)
Pt states that for the last 4-5 days she has been running a fever of 101-102. Pt went to PCP earlier today and diagnosed with UTI. Pt sent to ED for evaluation and concern for urosepsis. Pt states last antipyretic at 0500 this morning.

## 2022-01-27 NOTE — ED Provider Notes (Addendum)
[facility] DEPT Provider Note   CSN: [id number] Arrival date & time: 01/27/22  1150     History  Chief Complaint  Patient presents with   Urinary Tract Infection    Janet Mccarthy is a 87 y.o. female.  HPI 87 year old female presents today from 13 office with reports that she has had fever and chills over the past several days.  Last fever was in the early morning hours about 5 AM and temp was to 102.  She took acetaminophen at that time.  She was seen her primary care office and sent her to the ED with concerns for urosepsis.  She does endorse that she has had some nausea decreased p.o. intake and increased urination     Home Medications Prior to Admission medications   Medication Sig Start Date End Date Taking? Authorizing Provider  amitriptyline (ELAVIL) 50 MG tablet Take 50 mg by mouth daily. 02/23/17   [provider]  clobetasol cream (TEMOVATE) 9.50 % Apply 1 application topically daily as needed (irritation). 09/23/14   [provider]  COVID-19 mRNA bivalent vaccine, Pfizer, (PFIZER COVID-19 VAC BIVALENT) injection Inject into the muscle. 05/15/21   Carlyle Basques, MD  hydrochlorothiazide (MICROZIDE) 12.5 MG capsule Take 12.5 mg by mouth daily.    [provider]  levothyroxine (SYNTHROID) 75 MCG tablet Take 75 mcg by mouth daily before breakfast. 02/20/21   [provider]  pantoprazole (PROTONIX) 40 MG tablet Take 1 tablet (40 mg total) by mouth daily. Office visit for further refills 11/19/21   Vladimir Crofts, PA-C  traMADol (ULTRAM) 50 MG tablet Take 1-2 tablets (50-100 mg total) by mouth every 6 (six) hours as needed for moderate pain. 06/02/21   Edmisten, Ok Anis, PA      Allergies    Sulfa antibiotics    Review of Systems   Review of Systems  Physical Exam Updated Vital Signs BP (!) 170/61   Pulse (!) 116   Temp (!) 100.7 F (38.2 C) (Rectal)   Resp (!) 29   SpO2 100%   Physical Exam Vitals and nursing note reviewed.  Constitutional:      Appearance: Normal appearance.  HENT:     Head: Normocephalic.     Right Ear: External ear normal.     Left Ear: External ear normal.     Nose: Nose normal.     Mouth/Throat:     Pharynx: Oropharynx is clear.  Eyes:     Extraocular Movements: Extraocular movements intact.     Pupils: Pupils are equal, round, and reactive to light.  Cardiovascular:     Rate and Rhythm: Normal rate and regular rhythm.     Pulses: Normal pulses.  Pulmonary:     Effort: Pulmonary effort is normal.     Breath sounds: Normal breath sounds.  Abdominal:     General: Abdomen is flat. Bowel sounds are normal.     Palpations: Abdomen is soft.  Genitourinary:    Comments: No CVA tenderness Musculoskeletal:        General: Normal range of motion.     Cervical back: Normal range of motion.  Skin:    General: Skin is warm.     Capillary Refill: Capillary refill takes less than 2 seconds.  Neurological:     General: No focal deficit present.     Mental Status: She is alert.  Psychiatric:        Mood and Affect: Mood normal.  ED Results / Procedures / Treatments   Labs (all labs ordered are listed, but only abnormal results are displayed) Labs Reviewed  URINALYSIS, ROUTINE W REFLEX MICROSCOPIC - Abnormal; Notable for the following components:      Result Value   APPearance CLOUDY (*)    Hgb urine dipstick SMALL (*)    Protein, ur 30 (*)    Leukocytes,Ua LARGE (*)    WBC, UA >50 (*)    Bacteria, UA MANY (*)    All other components within normal limits  CBC - Abnormal; Notable for the following components:   WBC 25.3 (*)    All other components within normal limits  BASIC METABOLIC PANEL - Abnormal; Notable for the following components:   Sodium 132 (*)    Potassium 2.9 (*)    Chloride 93 (*)    Glucose, Bld 140 (*)    BUN 35 (*)    Creatinine, Ser 1.61 (*)    GFR, Estimated 31 (*)    All other components within  normal limits  CULTURE, BLOOD (ROUTINE X 2)  CULTURE, BLOOD (ROUTINE X 2)  URINE CULTURE  LACTIC ACID, PLASMA  LACTIC ACID, PLASMA    EKG None  Radiology No results found.  Procedures Procedures    Medications Ordered in ED Medications  potassium chloride (KLOR-CON) packet 60 mEq (has no administration in time range)  lactated ringers bolus 500 mL (0 mLs Intravenous Stopped 01/27/22 1413)  cefTRIAXone (ROCEPHIN) 1 g in sodium chloride 0.9 % 100 mL IVPB (0 g Intravenous Stopped 01/27/22 1341)  cefTRIAXone (ROCEPHIN) 1 g in sodium chloride 0.9 % 100 mL IVPB (1 g Intravenous New Bag/Given 01/27/22 1421)    ED Course/ Medical Decision Making/ A&P Clinical Course as of 01/27/22 1539  Wed Jan 27, 2022  1025 Basic metabolic panel(!) BUN/creatinine elevated with creatinine 1.6 Potassium low at 2.9 [DR]  1538 Urinalysis, Routine w reflex microscopic Urine, In & Out Cath(!) Urinalysis significant for greater than 50 white blood cells and many bacteria [DR]  1538 CBC(!) CBC significant for leukocytosis 25,300 [DR]  1538 Lactic acid, plasma Lactic normal at 1.9 [DR]    Clinical Course User Index [DR] Janet Boss, MD                           Medical Decision Making Presents today with reports of possible UTI, fever, chills, poor p.o. intake. She is evaluated here with labs including blood cultures and lactic acid She is given normal saline and 1 g Rocephin Urine is significant for greater than 50 white blood cells She has leukocytosis of 25,000 She has hypokalemia with potassium of 2.9.  Potassium is being orally repleted 1- UTI 2- aki 3- hypokalemia  Amount and/or Complexity of Data Reviewed Labs: ordered. Decision-making details documented in ED Course. Radiology: independent interpretation performed. Decision-making details documented in ED Course. Discussion of management or test interpretation with external provider(s): Discussed with Dr. Maren Beach who will see for  admission  Risk Prescription drug management. Decision regarding hospitalization.            Final Clinical Impression(s) / ED Diagnoses Final diagnoses:  Urinary tract infection without hematuria, site unspecified  AKI (acute kidney injury) ([place])  Hypokalemia    Rx / DC Orders ED Discharge Orders     None         Janet Boss, MD 01/27/22 1539    Janet Boss, MD 01/27/22 1540

## 2022-01-28 ENCOUNTER — Inpatient Hospital Stay (HOSPITAL_COMMUNITY): Payer: Medicare Other

## 2022-01-28 DIAGNOSIS — E875 Hyperkalemia: Secondary | ICD-10-CM

## 2022-01-28 DIAGNOSIS — R6520 Severe sepsis without septic shock: Secondary | ICD-10-CM | POA: Diagnosis not present

## 2022-01-28 DIAGNOSIS — A4150 Gram-negative sepsis, unspecified: Secondary | ICD-10-CM | POA: Diagnosis not present

## 2022-01-28 DIAGNOSIS — R7881 Bacteremia: Secondary | ICD-10-CM

## 2022-01-28 LAB — BLOOD CULTURE ID PANEL (REFLEXED) - BCID2

## 2022-01-28 LAB — BASIC METABOLIC PANEL
Anion gap: 7 (ref 5–15)
BUN: 29 mg/dL — ABNORMAL HIGH (ref 8–23)
CO2: 26 mmol/L (ref 22–32)
Calcium: 9.8 mg/dL (ref 8.9–10.3)
Chloride: 108 mmol/L (ref 98–111)
Creatinine, Ser: 1.43 mg/dL — ABNORMAL HIGH (ref 0.44–1.00)
GFR, Estimated: 36 mL/min — ABNORMAL LOW (ref >60)
Glucose, Bld: 115 mg/dL — ABNORMAL HIGH (ref 70–99)
Potassium: 6 mmol/L — ABNORMAL HIGH (ref 3.5–5.1)
Sodium: 141 mmol/L (ref 135–145)

## 2022-01-28 LAB — CBC
HCT: 33.2 % — ABNORMAL LOW (ref 36.0–46.0)
Hemoglobin: 10.3 g/dL — ABNORMAL LOW (ref 12.0–15.0)
MCH: 29.9 pg (ref 26.0–34.0)
MCHC: 31 g/dL (ref 30.0–36.0)
MCV: 96.2 fL (ref 80.0–100.0)
Platelets: 188 10*3/uL (ref 150–400)
RBC: 3.45 MIL/uL — ABNORMAL LOW (ref 3.87–5.11)
RDW: 15.8 % — ABNORMAL HIGH (ref 11.5–15.5)
WBC: 16.4 10*3/uL — ABNORMAL HIGH (ref 4.0–10.5)
nRBC: 0 % (ref 0.0–0.2)

## 2022-01-28 LAB — POTASSIUM: Potassium: 5 mmol/L (ref 3.5–5.1)

## 2022-01-28 MED ORDER — ADULT MULTIVITAMIN W/MINERALS CH
1.0000 | ORAL_TABLET | Freq: Every day | ORAL | Status: DC
  Administered 2022-01-28 – 2022-01-31 (×4): 1 via ORAL
  Filled 2022-01-28 (×4): qty 1

## 2022-01-28 MED ORDER — ENSURE ENLIVE PO LIQD
237.0000 mL | Freq: Two times a day (BID) | ORAL | Status: DC
  Administered 2022-01-28 – 2022-01-29 (×3): 237 mL via ORAL

## 2022-01-28 MED ORDER — SODIUM ZIRCONIUM CYCLOSILICATE 10 G PO PACK: 10.0000 g | PACK | Freq: Once | ORAL | Status: DC

## 2022-01-28 MED ORDER — SODIUM CHLORIDE 0.9 % IV SOLN: INTRAVENOUS | Status: DC

## 2022-01-28 MED ORDER — CEFTRIAXONE SODIUM 2 G IJ SOLR
2.0000 g | INTRAMUSCULAR | Status: DC
  Administered 2022-01-28 – 2022-01-30 (×3): 2 g via INTRAVENOUS
  Filled 2022-01-28 (×3): qty 20

## 2022-01-28 NOTE — Progress Notes (Signed)
PROGRESS NOTE Janet Mccarthy  YTK:[id number] DOB: 1935-05-17 DOA: 01/27/2022 PCP: Crist Infante, MD   Brief Narrative/Hospital Course: 87 year old female with past medical history of hypertension, hypothyroidism, CKD stage III AAA baseline creatinine 1.0, previous leukocytosis 16 K 06/02/2021, presents for evaluation of fever for last 4 to 5 days , poor oral intake. She was seen by PCP diagnosed with UTI given normal saline 1 g Rocephin and sent to the ED for urosepsis. UA with WBC more than 50, labs with leukocytosis 25 K, severe hypokalemia, hyponatremia, creatinine 1.6.  Received additional 1 g ceftriaxone.  Treated with 60 mEq x 2 ordered.  Blood culture sent.  Lactic acid ordered and admission requested for further management. Patient remained hemodynamic stable overnight, blood culture with 2/4 bottle anaerobic E. coli    Subjective: Seen and examined this morning patient feels much improved today husband at the bedside. Overnight blood culture came back positive with anaerobic E. coli 2/4 bottles Tmax 103 at 3 PM in the ED since then no significant fever spike, blood pressure stable, on room air WBC continues to downtrend Creatinine decreasing 1.4 potassium was high 6.0  Assessment and Plan: Principal Problem:   Severe sepsis with acute organ dysfunction due to Gram negative bacteria (HCC) Active Problems:   Esophageal dysphagia   OA (osteoarthritis) of hip   Hypokalemia   Acute renal failure superimposed on stage 3a chronic kidney disease (HCC)   Leucocytosis   Hyponatremia   E coli bacteremia   Hyperkalemia   Severe sepsis with acute organ dysfunction due to Gram negative bacteria- coli: 2/4 anaerobic bottle with E. Coli UTI POA: BP stable, leukocytosis appropriately downtrending.  No fever spike.  Clinically patient appears to be improving.  Continue ceftriaxone 2 g - f/u blood and urine culture for final ID.   Severe hypokalemia -resolved Hyperkalemia: potassium is high  -recheck potassium stable, discontinued potassium supplement and potassium continue IV fluids.     History of esophageal dysphagia-none currently per patient Decreased oral intake: Cont PPI,dietitian consult   Acute renal failure superimposed on stage 3a chronic kidney disease: Baseline creatinine 1.4 increased to 1.6 in the setting of poor oral intake severe sepsis-creatinine improved to 1.4.  Encourage oral hydration   HTN: Stable continue holding HCTZ.    Hyponatremia: Hypovolemic, resolved with IV fluids.  Hold HCTZ    DVT prophylaxis: enoxaparin (LOVENOX) injection 30 mg Start: 01/27/22 2200 Code Status:   Code Status: Full Code Family Communication: plan of care discussed with patient/husband at bedside. Patient status is: Inpatient because of ongoing management of sepsis Level of care: Progressive   Dispo: The patient is from: Home            Anticipated disposition: Home in 2 days  Mobility Assessment (last 72 hours)     Mobility Assessment     Row Name 01/27/22 2113 01/27/22 1730         Does patient have an order for bedrest or is patient medically unstable No - Continue assessment No - Continue assessment      What is the highest level of mobility based on the progressive mobility assessment? Level 4 (Walks with assist in room) - Balance while marching in place and cannot step forward and back - Complete Level 3 (Stands with assist) - Balance while standing  and cannot march in place      Is the above level different from baseline mobility prior to current illness? Yes - Recommend PT order --  Objective: Vitals last 24 hrs: Vitals:   01/27/22 1730 01/27/22 2232 01/28/22 0057 01/28/22 0623  BP: (!) 145/47 (!) 114/58 (!) 114/57 140/64  Pulse: (!) 108 87 84 (!) 106  Resp: '20 20 20 20  '$ Temp: (!) 100.8 F (38.2 C) 98 F (36.7 C) 98.5 F (36.9 C) 98.4 F (36.9 C)  TempSrc: Oral Oral Oral Oral  SpO2: 96% 97% 100% 100%   Weight change:    Physical Examination:  General exam: alert awake, oriented, appears perked up older than stated age, weak appearing. HEENT:Oral mucosa moist, Ear/Nose WNL grossly, dentition normal. Respiratory system: bilaterally diminished BS, no use of accessory muscle Cardiovascular system: S1 & S2 +, No JVD. Gastrointestinal system: Abdomen soft,NT,ND, BS+ Nervous System:Alert, awake, moving extremities and grossly nonfocal Extremities: LE edema negative,distal peripheral pulses palpable.  Skin: No rashes,no icterus. MSK: Normal muscle bulk,tone, power  Medications reviewed: Scheduled Meds:  amitriptyline  50 mg Oral QHS   enoxaparin (LOVENOX) injection  30 mg Subcutaneous Q24H   levothyroxine  75 mcg Oral QAC breakfast   pantoprazole (PROTONIX) IV  40 mg Intravenous Q1400   sodium chloride flush  3 mL Intravenous Q12H   Continuous Infusions:  sodium chloride     cefTRIAXone (ROCEPHIN)  IV        Diet Order             DIET SOFT Room service appropriate? Yes; Fluid consistency: Thin  Diet effective now                            Intake/Output Summary (Last 24 hours) at 01/28/2022 1104 Last data filed at 01/28/2022 0900 Gross per 24 hour  Intake 956.78 ml  Output --  Net 956.78 ml   Net IO Since Admission: 956.78 mL [01/28/22 1104]  Wt Readings from Last 3 Encounters:  11/19/21 76.7 kg  06/01/21 73.9 kg  05/20/21 73.9 kg     Unresulted Labs (From admission, onward)     Start     Ordered   02/03/22 0500  Creatinine, serum  (enoxaparin (LOVENOX)    CrCl < 30 ml/min)  Once,   R       Comments: while on enoxaparin therapy.    01/27/22 1612   01/28/22 9562  Basic metabolic panel  Daily,   R      01/27/22 1612   01/28/22 0500  CBC  Daily,   R      01/27/22 1612   01/27/22 1448  Urine Culture  Once,   URGENT       Question:  Indication  Answer:  Urgency/frequency   01/27/22 1448          Data Reviewed: I have personally reviewed following labs and imaging  studies CBC: Recent Labs  Lab 01/27/22 1258 01/28/22 0430  WBC 25.3* 16.4*  HGB 12.0 10.3*  HCT 37.9 33.2*  MCV 94.5 96.2  PLT 243 130   Basic Metabolic Panel: Recent Labs  Lab 01/27/22 1258 01/28/22 0430 01/28/22 0903  NA 132* 141  --   K 2.9* 6.0* 5.0  CL 93* 108  --   CO2 27 26  --   GLUCOSE 140* 115*  --   BUN 35* 29*  --   CREATININE 1.61* 1.43*  --   CALCIUM 9.9 9.8  --   MG 1.9  --   --    GFR: CrCl cannot be calculated (Unknown ideal weight.). Liver Function Tests:  No results for input(s): "AST", "ALT", "ALKPHOS", "BILITOT", "PROT", "ALBUMIN" in the last 168 hours. No results for input(s): "LIPASE", "AMYLASE" in the last 168 hours. No results for input(s): "AMMONIA" in the last 168 hours. Coagulation Profile: No results for input(s): "INR", "PROTIME" in the last 168 hours. BNP (last 3 results) No results for input(s): "PROBNP" in the last 8760 hours. HbA1C: No results for input(s): "HGBA1C" in the last 72 hours. CBG: No results for input(s): "GLUCAP" in the last 168 hours. Lipid Profile: No results for input(s): "CHOL", "HDL", "LDLCALC", "TRIG", "CHOLHDL", "LDLDIRECT" in the last 72 hours. Thyroid Function Tests: No results for input(s): "TSH", "T4TOTAL", "FREET4", "T3FREE", "THYROIDAB" in the last 72 hours. Sepsis Labs: Recent Labs  Lab 01/27/22 1258 01/27/22 1745  LATICACIDVEN 1.9 1.2    Recent Results (from the past 240 hour(s))  Blood culture (routine x 2)     Status: None (Preliminary result)   Collection Time: 01/27/22 12:58 PM   Specimen: BLOOD  Result Value Ref Range Status   Specimen Description   Final    BLOOD SITE NOT SPECIFIED Performed at [facility] [address], [place], [place] [address]    Special Requests   Final    BOTTLES DRAWN AEROBIC AND ANAEROBIC Blood Culture results may not be optimal due to an excessive volume of blood received in culture bottles Performed at [facility] [address], [place], [place] [address]    Culture  Setup Time   Final    GRAM NEGATIVE RODS IN BOTH AEROBIC AND ANAEROBIC BOTTLES CRITICAL VALUE NOTED.  VALUE IS CONSISTENT WITH PREVIOUSLY REPORTED AND CALLED VALUE. Performed at [facility], [place] [address], [place], [place] [address]    Culture GRAM NEGATIVE RODS  Final   Report Status PENDING  Incomplete  Blood culture (routine x 2)     Status: None (Preliminary result)   Collection Time: 01/27/22 12:58 PM   Specimen: BLOOD  Result Value Ref Range Status   Specimen Description   Final    BLOOD SITE NOT SPECIFIED Performed at [facility] [address] [place], [place] [address]    Special Requests   Final    BOTTLES DRAWN AEROBIC AND ANAEROBIC Blood Culture adequate volume Performed at [facility] [address] [place], [place] [address]    Culture  Setup Time   Final    GRAM NEGATIVE RODS ANAEROBIC BOTTLE ONLY CRITICAL RESULT CALLED TO, READ BACK BY AND VERIFIED WITH: PHARMD MICHELLE BELL 01/28/22'@2'$ :21 BY TW IN BOTH AEROBIC AND ANAEROBIC BOTTLES Performed at [facility], [place] [address], [place], [place] [address]    Culture GRAM NEGATIVE RODS  Final   Report Status PENDING  Incomplete  Blood Culture ID Panel (Reflexed)     Status: Abnormal   Collection Time: 01/27/22 12:58 PM  Result Value Ref Range Status   Enterococcus faecalis NOT DETECTED NOT DETECTED Final   Enterococcus Faecium NOT DETECTED NOT DETECTED Final   Listeria monocytogenes NOT DETECTED NOT DETECTED Final   Staphylococcus species NOT DETECTED NOT DETECTED Final   Staphylococcus aureus (BCID) NOT DETECTED NOT DETECTED Final   Staphylococcus epidermidis NOT DETECTED NOT DETECTED Final   Staphylococcus lugdunensis NOT DETECTED NOT DETECTED Final   Streptococcus species NOT DETECTED NOT DETECTED Final   Streptococcus agalactiae NOT DETECTED NOT DETECTED Final   Streptococcus pneumoniae NOT DETECTED NOT  DETECTED Final   Streptococcus pyogenes NOT DETECTED NOT DETECTED Final  A.calcoaceticus-baumannii NOT DETECTED NOT DETECTED Final   Bacteroides fragilis NOT DETECTED NOT DETECTED Final   Enterobacterales DETECTED (A) NOT DETECTED Final    Comment: Enterobacterales represent a large order of gram negative bacteria, not a single organism. CRITICAL RESULT CALLED TO, READ BACK BY AND VERIFIED WITH: PHARMD MICHELLE BELL 01/28/22'@2'$ :21 BY TW    Enterobacter cloacae complex NOT DETECTED NOT DETECTED Final   Escherichia coli DETECTED (A) NOT DETECTED Final    Comment: CRITICAL RESULT CALLED TO, READ BACK BY AND VERIFIED WITH: PHARMD MICHELLE BELL 01/28/22'@2'$ :21 BY TW    Klebsiella aerogenes NOT DETECTED NOT DETECTED Final   Klebsiella oxytoca NOT DETECTED NOT DETECTED Final   Klebsiella pneumoniae NOT DETECTED NOT DETECTED Final   Proteus species NOT DETECTED NOT DETECTED Final   Salmonella species NOT DETECTED NOT DETECTED Final   Serratia marcescens NOT DETECTED NOT DETECTED Final   Haemophilus influenzae NOT DETECTED NOT DETECTED Final   Neisseria meningitidis NOT DETECTED NOT DETECTED Final   Pseudomonas aeruginosa NOT DETECTED NOT DETECTED Final   Stenotrophomonas maltophilia NOT DETECTED NOT DETECTED Final   Candida albicans NOT DETECTED NOT DETECTED Final   Candida auris NOT DETECTED NOT DETECTED Final   Candida glabrata NOT DETECTED NOT DETECTED Final   Candida krusei NOT DETECTED NOT DETECTED Final   Candida parapsilosis NOT DETECTED NOT DETECTED Final   Candida tropicalis NOT DETECTED NOT DETECTED Final   Cryptococcus neoformans/gattii NOT DETECTED NOT DETECTED Final   CTX-M ESBL NOT DETECTED NOT DETECTED Final   Carbapenem resistance IMP NOT DETECTED NOT DETECTED Final   Carbapenem resistance KPC NOT DETECTED NOT DETECTED Final   Carbapenem resistance NDM NOT DETECTED NOT DETECTED Final   Carbapenem resist OXA 48 LIKE NOT DETECTED NOT DETECTED Final   Carbapenem resistance VIM  NOT DETECTED NOT DETECTED Final    Comment: Performed at [facility], [address], [place], [place] [address]  SARS Coronavirus 2 by RT PCR (hospital order, performed in [facility] hospital lab) *cepheid single result test* Anterior Nasal Swab     Status: None   Collection Time: 01/27/22  4:57 PM   Specimen: Anterior Nasal Swab  Result Value Ref Range Status   SARS Coronavirus 2 by RT PCR NEGATIVE NEGATIVE Final    Comment: (NOTE) SARS-CoV-2 target nucleic acids are NOT DETECTED.  The SARS-CoV-2 RNA is generally detectable in upper and lower respiratory specimens during the acute phase of infection. The lowest concentration of SARS-CoV-2 viral copies this assay can detect is 250 copies / mL. A negative result does not preclude SARS-CoV-2 infection and should not be used as the sole basis for treatment or other patient management decisions.  A negative result may occur with improper specimen collection / handling, submission of specimen other than nasopharyngeal swab, presence of viral mutation(s) within the areas targeted by this assay, and inadequate number of viral copies (<250 copies / mL). A negative result must be combined with clinical observations, patient history, and epidemiological information.  Fact Sheet for Patients:   https://www.patel.info/  Fact Sheet for Healthcare Providers: https://hall.com/  This test is not yet approved or  cleared by the [place] FDA and has been authorized for detection and/or diagnosis of SARS-CoV-2 by FDA under an Emergency Use Authorization (EUA).  This EUA will remain in effect (meaning this test can be used) for the duration of the COVID-19 declaration under Section 564(b)(1) of the Act, 21 U.S.C. section 360bbb-3(b)(1), unless the authorization is terminated or revoked sooner.  Performed at [facility], [place] [address], [place], [place] [address]    Respiratory (~20 pathogens) panel by PCR     Status: None   Collection Time: 01/27/22  4:57 PM   Specimen: Nasopharyngeal Swab; Respiratory  Result Value Ref Range Status   Adenovirus NOT DETECTED NOT DETECTED Final   Coronavirus 229E NOT DETECTED NOT DETECTED Final    Comment: (NOTE) The Coronavirus on the Respiratory Panel, DOES NOT test for the novel  Coronavirus (2019 nCoV)    Coronavirus HKU1 NOT DETECTED NOT DETECTED Final   Coronavirus NL63 NOT DETECTED NOT DETECTED Final   Coronavirus OC43 NOT DETECTED NOT DETECTED Final   Metapneumovirus NOT DETECTED NOT DETECTED Final   Rhinovirus / Enterovirus NOT DETECTED NOT DETECTED Final   Influenza A NOT DETECTED NOT DETECTED Final   Influenza B NOT DETECTED NOT DETECTED Final   Parainfluenza Virus 1 NOT DETECTED NOT DETECTED Final   Parainfluenza Virus 2 NOT DETECTED NOT DETECTED Final   Parainfluenza Virus 3 NOT DETECTED NOT DETECTED Final   Parainfluenza Virus 4 NOT DETECTED NOT DETECTED Final   Respiratory Syncytial Virus NOT DETECTED NOT DETECTED Final   Bordetella pertussis NOT DETECTED NOT DETECTED Final   Bordetella Parapertussis NOT DETECTED NOT DETECTED Final   Chlamydophila pneumoniae NOT DETECTED NOT DETECTED Final   Mycoplasma pneumoniae NOT DETECTED NOT DETECTED Final    Comment: Performed at [facility], [place]. [address] [place], [place] [address]    Antimicrobials: Anti-infectives (From admission, onward)    Start     Dose/Rate Route Frequency Ordered Stop   01/28/22 1400  cefTRIAXone (ROCEPHIN) 2 g in sodium chloride 0.9 % 100 mL IVPB  Status:  Discontinued        2 g 200 mL/hr over 30 Minutes Intravenous Every 24 hours 01/27/22 1904 01/28/22 0228   01/28/22 1400  cefTRIAXone (ROCEPHIN) 2 g in sodium chloride 0.9 % 100 mL IVPB        2 g 200 mL/hr over 30 Minutes Intravenous Every 24 hours 01/28/22 0228     01/27/22 1415  cefTRIAXone (ROCEPHIN) 1 g in sodium chloride 0.9 % 100 mL IVPB        1 g 200  mL/hr over 30 Minutes Intravenous  Once 01/27/22 1407 01/27/22 1451   01/27/22 1230  cefTRIAXone (ROCEPHIN) 1 g in sodium chloride 0.9 % 100 mL IVPB        1 g 200 mL/hr over 30 Minutes Intravenous  Once 01/27/22 1223 01/27/22 1341      Culture/Microbiology    Component Value Date/Time   SDES  01/27/2022 1258    BLOOD SITE NOT SPECIFIED Performed at [facility], [place] [address] [place], [place] [address]    SDES  01/27/2022 1258    BLOOD SITE NOT SPECIFIED Performed at [facility], [place] [address] [place], [place] [address]    [place]  01/27/2022 1258    BOTTLES DRAWN AEROBIC AND ANAEROBIC Blood Culture results may not be optimal due to an excessive volume of blood received in culture bottles Performed at [facility], [place] [address] [place], [place] [address]    [place]  01/27/2022 1258    BOTTLES DRAWN AEROBIC AND ANAEROBIC Blood Culture adequate volume Performed at [facility], [place] [address] [place], [place] [address]    CULT GRAM NEGATIVE RODS 01/27/2022 1258   CULT GRAM NEGATIVE RODS 01/27/2022 1258   REPTSTATUS PENDING 01/27/2022 1258  REPTSTATUS PENDING 01/27/2022 1258    Other culture-see note  Radiology Studies: DG Chest Port 1 View  Result Date: 01/27/2022 CLINICAL DATA:  Fever, sepsis EXAM: PORTABLE CHEST 1 VIEW COMPARISON:  05/28/2004 FINDINGS: The heart size and mediastinal contours are within normal limits. Both lungs are clear. The visualized skeletal structures are unremarkable. IMPRESSION: No active disease. Electronically Signed   By: Franchot Gallo M.D.   On: 01/27/2022 17:34     LOS: 1 day   Antonieta Pert, MD [facility]  01/28/2022, 11:04 AM

## 2022-01-28 NOTE — Progress Notes (Signed)
   01/28/22 1131  Assess: MEWS Score  Temp (!) 100.8 F (38.2 C)  BP (!) 146/50  MAP (mmHg) 78  Pulse Rate (!) 103  SpO2 98 %  O2 Device Room Air  Assess: MEWS Score  MEWS Temp 1  MEWS Systolic 0  MEWS Pulse 1  MEWS RR 0  MEWS LOC 0  MEWS Score 2  MEWS Score Color Yellow  Assess: if the MEWS score is Yellow or Red  Were vital signs taken at a resting state? Yes  Focused Assessment No change from prior assessment  Does the patient meet 2 or more of the SIRS criteria? Yes  Does the patient have a confirmed or suspected source of infection? Yes  Provider and Rapid Response Notified?  (MD notified)  Treat  MEWS Interventions Administered prn meds/treatments  Pain Scale 0-10  Pain Score 0  Take Vital Signs  Increase Vital Sign Frequency  Yellow: Q 2hr X 2 then Q 4hr X 2, if remains yellow, continue Q 4hrs  Escalate  MEWS: Escalate Yellow: discuss with charge nurse/RN and consider discussing with provider and RRT  Notify: Charge Nurse/RN  Name of Charge Nurse/RN Notified Russ Halo, RN  Date Charge Nurse/RN Notified 01/28/22  Time Charge Nurse/RN Notified 1136  Notify: Provider  Provider Name/Title Antonieta Pert  Date Provider Notified 01/28/22  Time Provider Notified 1136  Method of Notification  (secure chat)  Notification Reason Change in status  Assess: SIRS CRITERIA  SIRS Temperature  0  SIRS Pulse 1  SIRS Respirations  0  SIRS WBC 1  SIRS Score Sum  2

## 2022-01-28 NOTE — Progress Notes (Signed)
Initial Nutrition Assessment  DOCUMENTATION CODES:   Not applicable  INTERVENTION:  Encourage adequate PO intake Ensure Enlive po BID, each supplement provides 350 kcal and 20 grams of protein. MVI with minerals daily Request updated measured weight  NUTRITION DIAGNOSIS:   Inadequate oral intake related to poor appetite as evidenced by meal completion < 50%, per patient/family report.  GOAL:   Patient will meet greater than or equal to 90% of their needs  MONITOR:   PO intake, Supplement acceptance, Labs, Weight trends  REASON FOR ASSESSMENT:   Consult, Malnutrition Screening Tool Assessment of nutrition requirement/status  ASSESSMENT:   Pt admitted with UTI found to have severe sepsis with acute organ dysfunction d/t gram negative bacteria. PMH significant for HTN, hypothyroidism, CKD stage III and AAA.  Unsuccessful attempt to reach pt via phone call to room. Per review of chart, pt noted to have had a fever for the last 4-5 days PTA with poor oral intake. She has a history of esophageal dysphagia, however denies currently difficulty related to this.   Meal completions: 06/28: 10%-dinner  Reviewed wt history. Unfortunately, there is limited documentation of wt history within the last year. Last documented wt was 76.7 kg on 04/20. Will request updated wt to review.   Pt would benefit from the addition of nutrition supplements to enhance nutritional adequacy. Will order and adjust supplements as appropriate at follow up.   Edema: non-pitting BLE  Medications: synthroid, protonix IV drips: NaCl '@50ml'$ /hr, abx  Labs: BUN 29, Cr 1.43, GFR 36  UOP: 74m x24 hours I/O's: +9566msince admission  NUTRITION - FOCUSED PHYSICAL EXAM: RD working remotely. Deferred to follow up.   Diet Order:   Diet Order             DIET SOFT Room service appropriate? Yes; Fluid consistency: Thin  Diet effective now                   EDUCATION NEEDS:   No education needs have  been identified at this time  Skin:  Skin Assessment: Reviewed RN Assessment  Last BM:  6/29  Height:   Ht Readings from Last 1 Encounters:  11/19/21 '5\' 6"'$  (1.676 m)    Weight:   Wt Readings from Last 1 Encounters:  11/19/21 76.7 kg    Ideal Body Weight:  59.1 kg  BMI:  There is no height or weight on file to calculate BMI.  Estimated Nutritional Needs:   Kcal:  1600-1800  Protein:  80-95g  Fluid:  >/=1.6L  AlClayborne DanaRDN, LDN Clinical Nutrition

## 2022-01-28 NOTE — Progress Notes (Signed)
  Transition of Care [facility]) Screening Note   Patient Details  Name: Janet Mccarthy Date of Birth: Jan 15, 1935   Transition of Care [facility]) CM/SW Contact:    Dessa Phi, RN Phone Number: 01/28/2022, 10:04 AM    Transition of Care Department [facility]) has reviewed patient and no TOC needs have been identified at this time. We will continue to monitor patient advancement through interdisciplinary progression rounds. If new patient transition needs arise, please place a TOC consult.

## 2022-01-28 NOTE — Progress Notes (Signed)
PHARMACY - PHYSICIAN COMMUNICATION CRITICAL VALUE ALERT - BLOOD CULTURE IDENTIFICATION (BCID)  Janet Mccarthy is an 87 y.o. female who presented to [facility] on 01/27/2022 with a chief complaint of UTI  Assessment:  2/4 bottles anaerobic E Coli, urosepsis  Name of physician (or Provider) Contacted: Clarene Essex, NP  Current antibiotics: ceftriaxone 2 gm IV q24  Changes to prescribed antibiotics recommended:  Patient is on recommended antibiotics - No changes needed  Results for orders placed or performed during the hospital encounter of 01/27/22  Blood Culture ID Panel (Reflexed) (Collected: 01/27/2022 12:58 PM)  Result Value Ref Range   Enterococcus faecalis NOT DETECTED NOT DETECTED   Enterococcus Faecium NOT DETECTED NOT DETECTED   Listeria monocytogenes NOT DETECTED NOT DETECTED   Staphylococcus species NOT DETECTED NOT DETECTED   Staphylococcus aureus (BCID) NOT DETECTED NOT DETECTED   Staphylococcus epidermidis NOT DETECTED NOT DETECTED   Staphylococcus lugdunensis NOT DETECTED NOT DETECTED   Streptococcus species NOT DETECTED NOT DETECTED   Streptococcus agalactiae NOT DETECTED NOT DETECTED   Streptococcus pneumoniae NOT DETECTED NOT DETECTED   Streptococcus pyogenes NOT DETECTED NOT DETECTED   A.calcoaceticus-baumannii NOT DETECTED NOT DETECTED   Bacteroides fragilis NOT DETECTED NOT DETECTED   Enterobacterales DETECTED (A) NOT DETECTED   Enterobacter cloacae complex NOT DETECTED NOT DETECTED   Escherichia coli DETECTED (A) NOT DETECTED   Klebsiella aerogenes NOT DETECTED NOT DETECTED   Klebsiella oxytoca NOT DETECTED NOT DETECTED   Klebsiella pneumoniae NOT DETECTED NOT DETECTED   Proteus species NOT DETECTED NOT DETECTED   Salmonella species NOT DETECTED NOT DETECTED   Serratia marcescens NOT DETECTED NOT DETECTED   Haemophilus influenzae NOT DETECTED NOT DETECTED   Neisseria meningitidis NOT DETECTED NOT DETECTED   Pseudomonas aeruginosa NOT DETECTED NOT DETECTED    Stenotrophomonas maltophilia NOT DETECTED NOT DETECTED   Candida albicans NOT DETECTED NOT DETECTED   Candida auris NOT DETECTED NOT DETECTED   Candida glabrata NOT DETECTED NOT DETECTED   Candida krusei NOT DETECTED NOT DETECTED   Candida parapsilosis NOT DETECTED NOT DETECTED   Candida tropicalis NOT DETECTED NOT DETECTED   Cryptococcus neoformans/gattii NOT DETECTED NOT DETECTED   CTX-M ESBL NOT DETECTED NOT DETECTED   Carbapenem resistance IMP NOT DETECTED NOT DETECTED   Carbapenem resistance KPC NOT DETECTED NOT DETECTED   Carbapenem resistance NDM NOT DETECTED NOT DETECTED   Carbapenem resist OXA 48 LIKE NOT DETECTED NOT DETECTED   Carbapenem resistance VIM NOT DETECTED NOT DETECTED    Eudelia Bunch, Pharm.D 01/28/2022 2:27 AM

## 2022-01-29 ENCOUNTER — Encounter (HOSPITAL_COMMUNITY): Payer: Self-pay | Admitting: Internal Medicine

## 2022-01-29 DIAGNOSIS — A4150 Gram-negative sepsis, unspecified: Secondary | ICD-10-CM | POA: Diagnosis not present

## 2022-01-29 DIAGNOSIS — R6520 Severe sepsis without septic shock: Secondary | ICD-10-CM | POA: Diagnosis not present

## 2022-01-29 LAB — CBC
HCT: 31.3 % — ABNORMAL LOW (ref 36.0–46.0)
Hemoglobin: 9.6 g/dL — ABNORMAL LOW (ref 12.0–15.0)
MCH: 29.6 pg (ref 26.0–34.0)
MCHC: 30.7 g/dL (ref 30.0–36.0)
MCV: 96.6 fL (ref 80.0–100.0)
Platelets: 192 K/uL (ref 150–400)
RBC: 3.24 MIL/uL — ABNORMAL LOW (ref 3.87–5.11)
RDW: 15.6 % — ABNORMAL HIGH (ref 11.5–15.5)
WBC: 12.9 K/uL — ABNORMAL HIGH (ref 4.0–10.5)
nRBC: 0 % (ref 0.0–0.2)

## 2022-01-29 LAB — BASIC METABOLIC PANEL WITH GFR
Anion gap: 5 (ref 5–15)
BUN: 19 mg/dL (ref 8–23)
CO2: 25 mmol/L (ref 22–32)
Calcium: 9.7 mg/dL (ref 8.9–10.3)
Chloride: 106 mmol/L (ref 98–111)
Creatinine, Ser: 1.25 mg/dL — ABNORMAL HIGH (ref 0.44–1.00)
GFR, Estimated: 42 mL/min — ABNORMAL LOW
Glucose, Bld: 113 mg/dL — ABNORMAL HIGH (ref 70–99)
Potassium: 4.3 mmol/L (ref 3.5–5.1)
Sodium: 136 mmol/L (ref 135–145)

## 2022-01-29 NOTE — Progress Notes (Signed)
PROGRESS NOTE BONNE WHACK  ELF:[id number] DOB: 12-Feb-1935 DOA: 01/27/2022 PCP: Crist Infante, MD   Brief Narrative/Hospital Course: 87 year old female with past medical history of hypertension, hypothyroidism, CKD stage III AAA baseline creatinine 1.0, previous leukocytosis 16 K 06/02/2021, presents for evaluation of fever for last 4 to 5 days , poor oral intake. She was seen by PCP diagnosed with UTI given normal saline 1 g Rocephin and sent to the ED for urosepsis. UA with WBC more than 50, labs with leukocytosis 25 K, severe hypokalemia, hyponatremia, creatinine 1.6.  Received additional 1 g ceftriaxone.  Treated with 60 mEq x 2 ordered.  Blood culture sent.  Lactic acid ordered and admission requested for further management. Patient remained hemodynamic stable overnight, blood culture with 2/4 bottle anaerobic E. coli    Subjective: Seen and examined.  Resting comfortably in the bedside chair.  Husband at the bedside.  She feels much improved.  Drinking eating well. Fever spike 100.8 1131 am yesterday afternoon along with tachycardia making her MEWS score red Labs with improving WBC count  Assessment and Plan: Principal Problem:   Severe sepsis with acute organ dysfunction due to Gram negative bacteria (HCC) Active Problems:   Esophageal dysphagia   OA (osteoarthritis) of hip   Hypokalemia   Acute renal failure superimposed on stage 3a chronic kidney disease (HCC)   Leucocytosis   Hyponatremia   E coli bacteremia   Hyperkalemia   Severe sepsis with acute organ dysfunction due to Gram negative bacteria- coli: 2/4 anaerobic bottle with E. Coli in BCID GNR UTI POA: Patient continues to improve clinically WBC downtrending, had fever spike yesterday.  Urine culture with gram-negative rods, Blood culture with gram-negative rods.Continue ceftriaxone 2 g pending further C/S on culture   Severe hypokalemia -resolved Hyperkalemia: resolved   History of esophageal dysphagia-none  currently per patient Decreased oral intake: Cont PPI, augment nutritional status    Acute renal failure superimposed on stage 3a chronic kidney disease: Creatinine improved to 1.2.  Appears better from previous baseline.  Encourage hydration orally.  Discontinue IV fluids Recent Labs  Lab 01/27/22 1258 01/28/22 0430 01/29/22 0445  BUN 35* 29* 19  CREATININE 1.61* 1.43* 1.25*    HTN: Controlled.  Holding HCTZ.    Hyponatremia: Hypovolemic, resolved with IV fluids.  Hold HCTZ    DVT prophylaxis: enoxaparin (LOVENOX) injection 30 mg Start: 01/27/22 2200 Code Status:   Code Status: Full Code Family Communication: plan of care discussed with patient/husband at bedside. Patient status is: Inpatient because of ongoing management of sepsis Level of care: Progressive   Dispo: The patient is from: Home            Anticipated disposition: Home tomorrow pending further culture  Mobility Assessment (last 72 hours)     Mobility Assessment     Row Name 01/28/22 2144 01/28/22 1847 01/27/22 2113 01/27/22 1730     Does patient have an order for bedrest or is patient medically unstable No - Continue assessment No - Continue assessment No - Continue assessment No - Continue assessment    What is the highest level of mobility based on the progressive mobility assessment? Level 4 (Walks with assist in room) - Balance while marching in place and cannot step forward and back - Complete Level 5 (Walks with assist in room/hall) - Balance while stepping forward/back and can walk in room with assist - Complete Level 4 (Walks with assist in room) - Balance while marching in place and cannot step forward and  back - Complete Level 3 (Stands with assist) - Balance while standing  and cannot march in place    Is the above level different from baseline mobility prior to current illness? Yes - Recommend PT order Yes - Recommend PT order Yes - Recommend PT order --              Objective: Vitals last 24  hrs: Vitals:   01/28/22 1806 01/28/22 2300 01/29/22 0342 01/29/22 0343  BP: (!) 166/87 (!) 141/69 140/68   Pulse: (!) 103 96    Resp:  (!) 24 (!) 21   Temp: 98.1 F (36.7 C) 98.8 F (37.1 C) 99.1 F (37.3 C)   TempSrc: Oral Oral Oral   SpO2: 99% 98% 95%   Weight:    77 kg   Weight change:   Physical Examination: General exam: AAox3, older than stated age, weak appearing. HEENT:Oral mucosa moist, Ear/Nose WNL grossly, dentition normal. Respiratory system: bilaterally diminished, no use of accessory muscle Cardiovascular system: S1 & S2 +, No JVD,. Gastrointestinal system: Abdomen soft,NT,ND,BS+ Nervous System:Alert, awake, moving extremities and grossly nonfocal Extremities: LE ankle edema neg , distal peripheral pulses palpable.  Skin: No rashes,no icterus. MSK: Normal muscle bulk,tone, power   Medications reviewed: Scheduled Meds:  amitriptyline  50 mg Oral QHS   enoxaparin (LOVENOX) injection  30 mg Subcutaneous Q24H   feeding supplement  237 mL Oral BID BM   levothyroxine  75 mcg Oral QAC breakfast   multivitamin with minerals  1 tablet Oral Daily   pantoprazole (PROTONIX) IV  40 mg Intravenous Q1400   sodium chloride flush  3 mL Intravenous Q12H   Continuous Infusions:  sodium chloride 50 mL/hr at 01/28/22 1114   cefTRIAXone (ROCEPHIN)  IV 2 g (01/28/22 1421)      Diet Order             DIET SOFT Room service appropriate? Yes; Fluid consistency: Thin  Diet effective now                    Nutrition Problem: Inadequate oral intake Etiology: poor appetite Signs/Symptoms: meal completion < 50%, per patient/family report Interventions: Ensure Enlive (each supplement provides 350kcal and 20 grams of protein), MVI   Intake/Output Summary (Last 24 hours) at 01/29/2022 0820 Last data filed at 01/29/2022 0723 Gross per 24 hour  Intake 327.68 ml  Output 900 ml  Net -572.32 ml    Net IO Since Admission: 264.46 mL [01/29/22 0820]  Wt Readings from Last 3  Encounters:  01/29/22 77 kg  11/19/21 76.7 kg  06/01/21 73.9 kg     Unresulted Labs (From admission, onward)     Start     Ordered   02/03/22 0500  Creatinine, serum  (enoxaparin (LOVENOX)    CrCl < 30 ml/min)  Once,   R       Comments: while on enoxaparin therapy.    01/27/22 1612   01/28/22 0093  Basic metabolic panel  Daily,   R      01/27/22 1612   01/28/22 0500  CBC  Daily,   R      01/27/22 1612          Data Reviewed: I have personally reviewed following labs and imaging studies CBC: Recent Labs  Lab 01/27/22 1258 01/28/22 0430 01/29/22 0445  WBC 25.3* 16.4* 12.9*  HGB 12.0 10.3* 9.6*  HCT 37.9 33.2* 31.3*  MCV 94.5 96.2 96.6  PLT 243 188 192  Basic Metabolic Panel: Recent Labs  Lab 01/27/22 1258 01/28/22 0430 01/28/22 0903 01/29/22 0445  NA 132* 141  --  136  K 2.9* 6.0* 5.0 4.3  CL 93* 108  --  106  CO2 27 26  --  25  GLUCOSE 140* 115*  --  113*  BUN 35* 29*  --  19  CREATININE 1.61* 1.43*  --  1.25*  CALCIUM 9.9 9.8  --  9.7  MG 1.9  --   --   --     GFR: Estimated Creatinine Clearance: 34.5 mL/min (A) (by C-G formula based on SCr of 1.25 mg/dL (H)). Liver Function Tests: No results for input(s): "AST", "ALT", "ALKPHOS", "BILITOT", "PROT", "ALBUMIN" in the last 168 hours. No results for input(s): "LIPASE", "AMYLASE" in the last 168 hours. No results for input(s): "AMMONIA" in the last 168 hours. Coagulation Profile: No results for input(s): "INR", "PROTIME" in the last 168 hours. BNP (last 3 results) No results for input(s): "PROBNP" in the last 8760 hours. HbA1C: No results for input(s): "HGBA1C" in the last 72 hours. CBG: No results for input(s): "GLUCAP" in the last 168 hours. Lipid Profile: No results for input(s): "CHOL", "HDL", "LDLCALC", "TRIG", "CHOLHDL", "LDLDIRECT" in the last 72 hours. Thyroid Function Tests: No results for input(s): "TSH", "T4TOTAL", "FREET4", "T3FREE", "THYROIDAB" in the last 72 hours. Sepsis  Labs: Recent Labs  Lab 01/27/22 1258 01/27/22 1745  LATICACIDVEN 1.9 1.2     Recent Results (from the past 240 hour(s))  Blood culture (routine x 2)     Status: None (Preliminary result)   Collection Time: 01/27/22 12:58 PM   Specimen: BLOOD  Result Value Ref Range Status   Specimen Description   Final    BLOOD SITE NOT SPECIFIED Performed at [facility] [address], [place], [place] [address]    Special Requests   Final    BOTTLES DRAWN AEROBIC AND ANAEROBIC Blood Culture results may not be optimal due to an excessive volume of blood received in culture bottles Performed at [facility] [address] [place], [place] [address]    Culture  Setup Time   Final    GRAM NEGATIVE RODS IN BOTH AEROBIC AND ANAEROBIC BOTTLES CRITICAL VALUE NOTED.  VALUE IS CONSISTENT WITH PREVIOUSLY REPORTED AND CALLED VALUE. Performed at [facility], [place] [address], [place], [place] [address]    Culture GRAM NEGATIVE RODS  Final   Report Status PENDING  Incomplete  Blood culture (routine x 2)     Status: None (Preliminary result)   Collection Time: 01/27/22 12:58 PM   Specimen: BLOOD  Result Value Ref Range Status   Specimen Description   Final    BLOOD SITE NOT SPECIFIED Performed at [facility] [address], [place], [place] [address]    Special Requests   Final    BOTTLES DRAWN AEROBIC AND ANAEROBIC Blood Culture adequate volume Performed at [facility] [address], [place], [place] [address]    Culture  Setup Time   Final    GRAM NEGATIVE RODS ANAEROBIC BOTTLE ONLY CRITICAL RESULT CALLED TO, READ BACK BY AND VERIFIED WITH: PHARMD MICHELLE BELL 01/28/22'@2'$ :21 BY TW IN BOTH AEROBIC AND ANAEROBIC BOTTLES Performed at [facility], [place] [address], [place],  [address]    Culture GRAM NEGATIVE RODS  Final   Report Status PENDING  Incomplete  Blood Culture ID Panel (Reflexed)      Status: Abnormal  Collection Time: 01/27/22 12:58 PM  Result Value Ref Range Status   Enterococcus faecalis NOT DETECTED NOT DETECTED Final   Enterococcus Faecium NOT DETECTED NOT DETECTED Final   Listeria monocytogenes NOT DETECTED NOT DETECTED Final   Staphylococcus species NOT DETECTED NOT DETECTED Final   Staphylococcus aureus (BCID) NOT DETECTED NOT DETECTED Final   Staphylococcus epidermidis NOT DETECTED NOT DETECTED Final   Staphylococcus lugdunensis NOT DETECTED NOT DETECTED Final   Streptococcus species NOT DETECTED NOT DETECTED Final   Streptococcus agalactiae NOT DETECTED NOT DETECTED Final   Streptococcus pneumoniae NOT DETECTED NOT DETECTED Final   Streptococcus pyogenes NOT DETECTED NOT DETECTED Final   A.calcoaceticus-baumannii NOT DETECTED NOT DETECTED Final   Bacteroides fragilis NOT DETECTED NOT DETECTED Final   Enterobacterales DETECTED (A) NOT DETECTED Final    Comment: Enterobacterales represent a large order of gram negative bacteria, not a single organism. CRITICAL RESULT CALLED TO, READ BACK BY AND VERIFIED WITH: PHARMD MICHELLE BELL 01/28/22'@2'$ :21 BY TW    Enterobacter cloacae complex NOT DETECTED NOT DETECTED Final   Escherichia coli DETECTED (A) NOT DETECTED Final    Comment: CRITICAL RESULT CALLED TO, READ BACK BY AND VERIFIED WITH: PHARMD MICHELLE BELL 01/28/22'@2'$ :21 BY TW    Klebsiella aerogenes NOT DETECTED NOT DETECTED Final   Klebsiella oxytoca NOT DETECTED NOT DETECTED Final   Klebsiella pneumoniae NOT DETECTED NOT DETECTED Final   Proteus species NOT DETECTED NOT DETECTED Final   Salmonella species NOT DETECTED NOT DETECTED Final   Serratia marcescens NOT DETECTED NOT DETECTED Final   Haemophilus influenzae NOT DETECTED NOT DETECTED Final   Neisseria meningitidis NOT DETECTED NOT DETECTED Final   Pseudomonas aeruginosa NOT DETECTED NOT DETECTED Final   Stenotrophomonas maltophilia NOT DETECTED NOT DETECTED Final   Candida albicans NOT DETECTED NOT  DETECTED Final   Candida auris NOT DETECTED NOT DETECTED Final   Candida glabrata NOT DETECTED NOT DETECTED Final   Candida krusei NOT DETECTED NOT DETECTED Final   Candida parapsilosis NOT DETECTED NOT DETECTED Final   Candida tropicalis NOT DETECTED NOT DETECTED Final   Cryptococcus neoformans/gattii NOT DETECTED NOT DETECTED Final   CTX-M ESBL NOT DETECTED NOT DETECTED Final   Carbapenem resistance IMP NOT DETECTED NOT DETECTED Final   Carbapenem resistance KPC NOT DETECTED NOT DETECTED Final   Carbapenem resistance NDM NOT DETECTED NOT DETECTED Final   Carbapenem resist OXA 48 LIKE NOT DETECTED NOT DETECTED Final   Carbapenem resistance VIM NOT DETECTED NOT DETECTED Final    Comment: Performed at [facility], [address], [place], [place] [address]  Urine Culture     Status: Abnormal (Preliminary result)   Collection Time: 01/27/22  1:22 PM   Specimen: In/Out Cath Urine  Result Value Ref Range Status   Specimen Description   Final    IN/OUT CATH URINE Performed at [facility] [address] [place], [place] [address]    Special Requests   Final    NONE Performed at [facility], [place] [address] [place], [place] [address]    Culture >=100,000 COLONIES/mL GRAM NEGATIVE RODS (A)  Final   Report Status PENDING  Incomplete  SARS Coronavirus 2 by RT PCR (hospital order, performed in [facility] hospital lab) *cepheid single result test* Anterior Nasal Swab     Status: None   Collection Time: 01/27/22  4:57 PM   Specimen: Anterior Nasal Swab  Result Value Ref Range Status   SARS Coronavirus 2 by RT PCR NEGATIVE  NEGATIVE Final    Comment: (NOTE) SARS-CoV-2 target nucleic acids are NOT DETECTED.  The SARS-CoV-2 RNA is generally detectable in upper and lower respiratory specimens during the acute phase of infection. The lowest concentration of SARS-CoV-2 viral copies this assay can detect is 250 copies / mL. A negative result does  not preclude SARS-CoV-2 infection and should not be used as the sole basis for treatment or other patient management decisions.  A negative result may occur with improper specimen collection / handling, submission of specimen other than nasopharyngeal swab, presence of viral mutation(s) within the areas targeted by this assay, and inadequate number of viral copies (<250 copies / mL). A negative result must be combined with clinical observations, patient history, and epidemiological information.  Fact Sheet for Patients:   https://www.patel.info/  Fact Sheet for Healthcare Providers: https://hall.com/  This test is not yet approved or  cleared by the [place] FDA and has been authorized for detection and/or diagnosis of SARS-CoV-2 by FDA under an Emergency Use Authorization (EUA).  This EUA will remain in effect (meaning this test can be used) for the duration of the COVID-19 declaration under Section 564(b)(1) of the Act, 21 U.S.C. section 360bbb-3(b)(1), unless the authorization is terminated or revoked sooner.  Performed at [facility], [place] [address] [place], [place] [address]   Respiratory (~20 pathogens) panel by PCR     Status: None   Collection Time: 01/27/22  4:57 PM   Specimen: Nasopharyngeal Swab; Respiratory  Result Value Ref Range Status   Adenovirus NOT DETECTED NOT DETECTED Final   Coronavirus 229E NOT DETECTED NOT DETECTED Final    Comment: (NOTE) The Coronavirus on the Respiratory Panel, DOES NOT test for the novel  Coronavirus (2019 nCoV)    Coronavirus HKU1 NOT DETECTED NOT DETECTED Final   Coronavirus NL63 NOT DETECTED NOT DETECTED Final   Coronavirus OC43 NOT DETECTED NOT DETECTED Final   Metapneumovirus NOT DETECTED NOT DETECTED Final   Rhinovirus / Enterovirus NOT DETECTED NOT DETECTED Final   Influenza A NOT DETECTED NOT DETECTED Final   Influenza B NOT DETECTED NOT DETECTED Final    Parainfluenza Virus 1 NOT DETECTED NOT DETECTED Final   Parainfluenza Virus 2 NOT DETECTED NOT DETECTED Final   Parainfluenza Virus 3 NOT DETECTED NOT DETECTED Final   Parainfluenza Virus 4 NOT DETECTED NOT DETECTED Final   Respiratory Syncytial Virus NOT DETECTED NOT DETECTED Final   Bordetella pertussis NOT DETECTED NOT DETECTED Final   Bordetella Parapertussis NOT DETECTED NOT DETECTED Final   Chlamydophila pneumoniae NOT DETECTED NOT DETECTED Final   Mycoplasma pneumoniae NOT DETECTED NOT DETECTED Final    Comment: Performed at [facility], [place]. [address] [place], [place] [address]    Antimicrobials: Anti-infectives (From admission, onward)    Start     Dose/Rate Route Frequency Ordered Stop   01/28/22 1400  cefTRIAXone (ROCEPHIN) 2 g in sodium chloride 0.9 % 100 mL IVPB  Status:  Discontinued        2 g 200 mL/hr over 30 Minutes Intravenous Every 24 hours 01/27/22 1904 01/28/22 0228   01/28/22 1400  cefTRIAXone (ROCEPHIN) 2 g in sodium chloride 0.9 % 100 mL IVPB        2 g 200 mL/hr over 30 Minutes Intravenous Every 24 hours 01/28/22 0228     01/27/22 1415  cefTRIAXone (ROCEPHIN) 1 g in sodium chloride 0.9 % 100 mL IVPB        1 g 200 mL/hr over  30 Minutes Intravenous  Once 01/27/22 1407 01/27/22 1451   01/27/22 1230  cefTRIAXone (ROCEPHIN) 1 g in sodium chloride 0.9 % 100 mL IVPB        1 g 200 mL/hr over 30 Minutes Intravenous  Once 01/27/22 1223 01/27/22 1341      Culture/Microbiology    Component Value Date/Time   SDES  01/27/2022 1322    IN/OUT CATH URINE Performed at [facility], [place] [address], [place], [place] [address]    SPECREQUEST  01/27/2022 1322    NONE Performed at [facility], [place] [address], [place], [place] [address]    CULT >=100,000 COLONIES/mL GRAM NEGATIVE RODS (A) 01/27/2022 1322   REPTSTATUS PENDING 01/27/2022 1322    Other culture-see note  Radiology Studies: CT RENAL STONE STUDY  Result  Date: 01/28/2022 CLINICAL DATA:  Flank pain.  Concern for kidney stone. EXAM: CT ABDOMEN AND PELVIS WITHOUT CONTRAST TECHNIQUE: Multidetector CT imaging of the abdomen and pelvis was performed following the standard protocol without IV contrast. RADIATION DOSE REDUCTION: This exam was performed according to the departmental dose-optimization program which includes automated exposure control, adjustment of the mA and/or kV according to patient size and/or use of iterative reconstruction technique. COMPARISON:  None Available. FINDINGS: Evaluation of this exam is limited in the absence of intravenous contrast. Lower chest: Trace bilateral pleural effusions versus pleural thickening. There are bibasilar scarring. There is calcification of the mitral annulus. No intra-abdominal free air or free fluid. Hepatobiliary: The liver is unremarkable. No intrahepatic biliary dilatation. Multiple gallstones. No pericholecystic fluid or evidence of acute cholecystitis by CT. Pancreas: Unremarkable. No pancreatic ductal dilatation or surrounding inflammatory changes. Spleen: Normal in size without focal abnormality. Adrenals/Urinary Tract: The adrenal glands are unremarkable. There is no hydronephrosis or nephrolithiasis on either side. There is right perinephric and periureteric stranding with minimal fullness of the right ureter. Findings may be related to a recently passed right renal calculus versus pyelonephritis. Correlation with urinalysis recommended. Several bilateral renal cysts measure up to 4.5 cm in the upper pole of the right kidney. Additional subcentimeter hypodense lesions are too small to characterize on this noncontrast CT. This can be better evaluated with ultrasound on a nonemergent/outpatient basis. The bladder is collapsed but poorly visualized due to streak artifact caused by bilateral hip arthroplasties. Stomach/Bowel: There is a small hiatal hernia. There is no bowel obstruction or active inflammation. No  CT evidence of acute appendicitis. Vascular/Lymphatic: Advanced aortoiliac atherosclerotic disease. The IVC is grossly unremarkable. No portal venous gas. There is no adenopathy. Reproductive: Several calcified fibroids.  No adnexal masses. Other: Midline vertical anterior pelvic wall incisional scar. Musculoskeletal: Degenerative changes of the spine. Bilateral total hip arthroplasties. No acute osseous pathology. IMPRESSION: 1. No hydronephrosis or nephrolithiasis. Possible recently passed right renal calculus versus pyelonephritis. Correlation with urinalysis recommended. 2. Cholelithiasis. 3. Aortic Atherosclerosis (ICD10-I70.0). Electronically Signed   By: Anner Crete M.D.   On: 01/28/2022 13:26   DG Chest Port 1 View  Result Date: 01/27/2022 CLINICAL DATA:  Fever, sepsis EXAM: PORTABLE CHEST 1 VIEW COMPARISON:  05/28/2004 FINDINGS: The heart size and mediastinal contours are within normal limits. Both lungs are clear. The visualized skeletal structures are unremarkable. IMPRESSION: No active disease. Electronically Signed   By: Franchot Gallo M.D.   On: 01/27/2022 17:34     LOS: 2 days   Antonieta Pert, MD [facility]  01/29/2022, 8:20 AM

## 2022-01-30 DIAGNOSIS — R6520 Severe sepsis without septic shock: Secondary | ICD-10-CM | POA: Diagnosis not present

## 2022-01-30 DIAGNOSIS — N12 Tubulo-interstitial nephritis, not specified as acute or chronic: Secondary | ICD-10-CM

## 2022-01-30 DIAGNOSIS — A4150 Gram-negative sepsis, unspecified: Secondary | ICD-10-CM | POA: Diagnosis not present

## 2022-01-30 LAB — URINE CULTURE: Culture: >=100000 — AB

## 2022-01-30 LAB — BASIC METABOLIC PANEL
Anion gap: 6 (ref 5–15)
BUN: 19 mg/dL (ref 8–23)
CO2: 24 mmol/L (ref 22–32)
Calcium: 9.5 mg/dL (ref 8.9–10.3)
Chloride: 102 mmol/L (ref 98–111)
Creatinine, Ser: 1.12 mg/dL — ABNORMAL HIGH (ref 0.44–1.00)
GFR, Estimated: 48 mL/min — ABNORMAL LOW (ref >60)
Glucose, Bld: 110 mg/dL — ABNORMAL HIGH (ref 70–99)
Potassium: 3.9 mmol/L (ref 3.5–5.1)
Sodium: 132 mmol/L — ABNORMAL LOW (ref 135–145)

## 2022-01-30 LAB — CBC
HCT: 29.2 % — ABNORMAL LOW (ref 36.0–46.0)
Hemoglobin: 9.2 g/dL — ABNORMAL LOW (ref 12.0–15.0)
MCH: 30 pg (ref 26.0–34.0)
MCHC: 31.5 g/dL (ref 30.0–36.0)
MCV: 95.1 fL (ref 80.0–100.0)
Platelets: 209 10*3/uL (ref 150–400)
RBC: 3.07 MIL/uL — ABNORMAL LOW (ref 3.87–5.11)
RDW: 15.7 % — ABNORMAL HIGH (ref 11.5–15.5)
WBC: 10.9 10*3/uL — ABNORMAL HIGH (ref 4.0–10.5)
nRBC: 0 % (ref 0.0–0.2)

## 2022-01-30 MED ORDER — PANTOPRAZOLE SODIUM 40 MG PO TBEC
40.0000 mg | DELAYED_RELEASE_TABLET | Freq: Every day | ORAL | Status: DC
  Administered 2022-01-30 – 2022-01-31 (×2): 40 mg via ORAL
  Filled 2022-01-30 (×2): qty 1

## 2022-01-30 NOTE — Progress Notes (Signed)
PROGRESS NOTE DHRITHI RICHE  WYO:[id number] DOB: [birth year] DOA: 01/27/2022 PCP: Crist Infante, MD   Brief Narrative/Hospital Course: 87 year old female with past medical history of hypertension, hypothyroidism, CKD stage III AAA baseline creatinine 1.0, previous leukocytosis 16 K 06/02/2021, presents for evaluation of fever for last 4 to 5 days , poor oral intake. She was seen by PCP diagnosed with UTI given normal saline 1 g Rocephin and sent to the ED for urosepsis. UA with WBC more than 50, labs with leukocytosis 25 K, severe hypokalemia, hyponatremia, creatinine 1.6.  Received additional 1 g ceftriaxone.  Treated with 60 mEq x 2 ordered.  Blood culture sent.  Lactic acid ordered and admission requested for further management. Patient remained hemodynamic stable overnight, blood culture with 2/4 bottle anaerobic w E. Coli in BCID, urine culture also grew E. Coli.  Patient is remarkably improved with IV antibiotics AKI is resolved leukocytosis almost normalized.  Mental status stable.PT requested for disposition.  Once culture resulted she will be discharged home.Urine culture w/ e coli sensitive to  cefazolin, rocephin, bactrim.    Subjective: Seen and examined this morning.  She is resting in the bedside chair son and husband at the bedside she is eager to go home.  Overall much improved walked in the hallway and no PT needed.    Assessment and Plan: Principal Problem:   Severe sepsis with acute organ dysfunction due to Gram negative bacteria (HCC) Active Problems:   Esophageal dysphagia   OA (osteoarthritis) of hip   Hypokalemia   Acute renal failure superimposed on stage 3a chronic kidney disease (HCC)   Leucocytosis   Hyponatremia   E coli bacteremia   Hyperkalemia   Pyelonephritis of right kidney   Severe sepsis secondary to E. coli  Right pyelonephritis/UTI due to E. coli Suspect patient's sepsis is due to pyelo and E. coli UTI.  Culture sensitivity pending on blood only  available tomorrow, urine culture reviewed.  Continue ceftriaxone for now.  Overall clinically improved leukocytosis resolving  Recent Labs  Lab 01/27/22 1258 01/27/22 1745 01/28/22 0430 01/29/22 0445 01/30/22 0539  WBC 25.3*  --  16.4* 12.9* 10.9*  LATICACIDVEN 1.9 1.2  --   --   --     Severe hypokalemia -resolved Hyperkalemia: resolved   History of esophageal dysphagia-none currently per patient Decreased oral intake: Continue PPI, tolerating diet.   Acute renal failure superimposed on stage 3a chronic kidney disease: AKI improved to 1.1 encourage oral hydration.  Recent Labs  Lab 01/27/22 1258 01/28/22 0430 01/29/22 0445 01/30/22 0539  BUN 35* 29* 19 19  CREATININE 1.61* 1.43* 1.25* 1.12*    HTN: Remains stable continue to hold HCTZ.   Hypovolemic hyponatremia: Resolved.    DVT prophylaxis: enoxaparin (LOVENOX) injection 30 mg Start: 01/27/22 2200 Code Status:   Code Status: Full Code Family Communication: plan of care discussed with patient/husband at bedside. Patient status is: Inpatient because of ongoing management of sepsis with IV antibiotics Level of care: Progressive   Dispo: The patient is from: Home            Anticipated disposition: Home tomorrow pending blood culture sensitivity   Mobility Assessment (last 72 hours)     Mobility Assessment     Row Name 01/30/22 0928 01/29/22 2000 01/29/22 1057 01/29/22 1035 01/28/22 2144   Does patient have an order for bedrest or is patient medically unstable -- No - Continue assessment No - Continue assessment No - Continue assessment No - Continue assessment  What is the highest level of mobility based on the progressive mobility assessment? Level 5 (Walks with assist in room/hall) - Balance while stepping forward/back and can walk in room with assist - Complete Level 5 (Walks with assist in room/hall) - Balance while stepping forward/back and can walk in room with assist - Complete Level 5 (Walks with assist in  room/hall) - Balance while stepping forward/back and can walk in room with assist - Complete Level 5 (Walks with assist in room/hall) - Balance while stepping forward/back and can walk in room with assist - Complete Level 4 (Walks with assist in room) - Balance while marching in place and cannot step forward and back - Complete   Is the above level different from baseline mobility prior to current illness? -- Yes - Recommend PT order Yes - Recommend PT order Yes - Recommend PT order Yes - Recommend PT order    Row Name 01/28/22 1847 01/27/22 2113 01/27/22 1730       Does patient have an order for bedrest or is patient medically unstable No - Continue assessment No - Continue assessment No - Continue assessment     What is the highest level of mobility based on the progressive mobility assessment? Level 5 (Walks with assist in room/hall) - Balance while stepping forward/back and can walk in room with assist - Complete Level 4 (Walks with assist in room) - Balance while marching in place and cannot step forward and back - Complete Level 3 (Stands with assist) - Balance while standing  and cannot march in place     Is the above level different from baseline mobility prior to current illness? Yes - Recommend PT order Yes - Recommend PT order --               Objective: Vitals last 24 hrs: Vitals:   01/29/22 1830 01/29/22 1954 01/30/22 0441 01/30/22 0454  BP:  140/60 (!) 129/53   Pulse:  96 89   Resp:  20 18   Temp: 98.2 F (36.8 C) 98.7 F (37.1 C) 98.5 F (36.9 C)   TempSrc: Axillary Oral Oral   SpO2:  99% 95%   Weight:    80.7 kg  Height:       Weight change: 0 kg  Physical Examination: General exam: AA0x3, older than stated age, weak appearing. HEENT:Oral mucosa moist, Ear/Nose WNL grossly, dentition normal. Respiratory system: bilaterally clear no use of accessory muscle Cardiovascular system: S1 & S2 +, No JVD,. Gastrointestinal system: Abdomen soft,NT,ND,BS+ Nervous  System:Alert, awake, moving extremities and grossly nonfocal Extremities: LE ankle edema neg, distal peripheral pulses palpable.  Skin: No rashes,no icterus. MSK: Normal muscle bulk,tone, power   Medications reviewed: Scheduled Meds:  amitriptyline  50 mg Oral QHS   enoxaparin (LOVENOX) injection  30 mg Subcutaneous Q24H   feeding supplement  237 mL Oral BID BM   levothyroxine  75 mcg Oral QAC breakfast   multivitamin with minerals  1 tablet Oral Daily   pantoprazole (PROTONIX) IV  40 mg Intravenous Q1400   sodium chloride flush  3 mL Intravenous Q12H   Continuous Infusions:  cefTRIAXone (ROCEPHIN)  IV 2 g (01/29/22 1323)      Diet Order             DIET SOFT Room service appropriate? Yes; Fluid consistency: Thin  Diet effective now                    Nutrition Problem: Inadequate oral  intake Etiology: poor appetite Signs/Symptoms: meal completion < 50%, per patient/family report Interventions: Ensure Enlive (each supplement provides 350kcal and 20 grams of protein), MVI   Intake/Output Summary (Last 24 hours) at 01/30/2022 1150 Last data filed at 01/29/2022 2135 Gross per 24 hour  Intake 360 ml  Output 200 ml  Net 160 ml   Net IO Since Admission: 424.46 mL [01/30/22 1150]  Wt Readings from Last 3 Encounters:  01/30/22 80.7 kg  11/19/21 76.7 kg  06/01/21 73.9 kg     Unresulted Labs (From admission, onward)     Start     Ordered   02/03/22 0500  Creatinine, serum  (enoxaparin (LOVENOX)    CrCl < 30 ml/min)  Once,   R       Comments: while on enoxaparin therapy.    01/27/22 1612   01/28/22 2505  Basic metabolic panel  Daily,   R      01/27/22 1612   01/28/22 0500  CBC  Daily,   R      01/27/22 1612          Data Reviewed: I have personally reviewed following labs and imaging studies CBC: Recent Labs  Lab 01/27/22 1258 01/28/22 0430 01/29/22 0445 01/30/22 0539  WBC 25.3* 16.4* 12.9* 10.9*  HGB 12.0 10.3* 9.6* 9.2*  HCT 37.9 33.2* 31.3* 29.2*   MCV 94.5 96.2 96.6 95.1  PLT 243 188 192 397   Basic Metabolic Panel: Recent Labs  Lab 01/27/22 1258 01/28/22 0430 01/28/22 0903 01/29/22 0445 01/30/22 0539  NA 132* 141  --  136 132*  K 2.9* 6.0* 5.0 4.3 3.9  CL 93* 108  --  106 102  CO2 27 26  --  25 24  GLUCOSE 140* 115*  --  113* 110*  BUN 35* 29*  --  19 19  CREATININE 1.61* 1.43*  --  1.25* 1.12*  CALCIUM 9.9 9.8  --  9.7 9.5  MG 1.9  --   --   --   --    GFR: Estimated Creatinine Clearance: 39.4 mL/min (A) (by C-G formula based on SCr of 1.12 mg/dL (H)). Liver Function Tests: No results for input(s): "AST", "ALT", "ALKPHOS", "BILITOT", "PROT", "ALBUMIN" in the last 168 hours. No results for input(s): "LIPASE", "AMYLASE" in the last 168 hours. No results for input(s): "AMMONIA" in the last 168 hours. Coagulation Profile: No results for input(s): "INR", "PROTIME" in the last 168 hours. BNP (last 3 results) No results for input(s): "PROBNP" in the last 8760 hours. HbA1C: No results for input(s): "HGBA1C" in the last 72 hours. CBG: No results for input(s): "GLUCAP" in the last 168 hours. Lipid Profile: No results for input(s): "CHOL", "HDL", "LDLCALC", "TRIG", "CHOLHDL", "LDLDIRECT" in the last 72 hours. Thyroid Function Tests: No results for input(s): "TSH", "T4TOTAL", "FREET4", "T3FREE", "THYROIDAB" in the last 72 hours. Sepsis Labs: Recent Labs  Lab 01/27/22 1258 01/27/22 1745  LATICACIDVEN 1.9 1.2    Recent Results (from the past 240 hour(s))  Blood culture (routine x 2)     Status: Abnormal (Preliminary result)   Collection Time: 01/27/22 12:58 PM   Specimen: BLOOD  Result Value Ref Range Status   Specimen Description   Final    BLOOD SITE NOT SPECIFIED Performed at [facility] [address] [place], [place] [address]    Special Requests   Final    BOTTLES DRAWN AEROBIC AND ANAEROBIC Blood Culture results may not be optimal due to an excessive volume  of blood received in  culture bottles Performed at [facility], [place] [address] [place], [place] [address]    Culture  Setup Time   Final    GRAM NEGATIVE RODS IN BOTH AEROBIC AND ANAEROBIC BOTTLES CRITICAL VALUE NOTED.  VALUE IS CONSISTENT WITH PREVIOUSLY REPORTED AND CALLED VALUE. Performed at [facility], [place] [address], [place], [place] [address]    Culture ESCHERICHIA COLI (A)  Final   Report Status PENDING  Incomplete  Blood culture (routine x 2)     Status: Abnormal (Preliminary result)   Collection Time: 01/27/22 12:58 PM   Specimen: BLOOD  Result Value Ref Range Status   Specimen Description   Final    BLOOD SITE NOT SPECIFIED Performed at [facility] [address], [place], [place] [address]    Special Requests   Final    BOTTLES DRAWN AEROBIC AND ANAEROBIC Blood Culture adequate volume Performed at [facility] [address] [place], [place] [address]    Culture  Setup Time   Final    GRAM NEGATIVE RODS ANAEROBIC BOTTLE ONLY CRITICAL RESULT CALLED TO, READ BACK BY AND VERIFIED WITH: PHARMD MICHELLE BELL 01/28/22'@2'$ :21 BY TW IN BOTH AEROBIC AND ANAEROBIC BOTTLES    Culture (A)  Final    ESCHERICHIA COLI REPEATING SUSCEPTIBILITY TESTING Performed at [facility], [place] [address], [place], [place] [address]    Report Status PENDING  Incomplete  Blood Culture ID Panel (Reflexed)     Status: Abnormal   Collection Time: 01/27/22 12:58 PM  Result Value Ref Range Status   Enterococcus faecalis NOT DETECTED NOT DETECTED Final   Enterococcus Faecium NOT DETECTED NOT DETECTED Final   Listeria monocytogenes NOT DETECTED NOT DETECTED Final   Staphylococcus species NOT DETECTED NOT DETECTED Final   Staphylococcus aureus (BCID) NOT DETECTED NOT DETECTED Final   Staphylococcus epidermidis NOT DETECTED NOT DETECTED Final   Staphylococcus lugdunensis NOT DETECTED NOT DETECTED Final   Streptococcus species NOT DETECTED NOT  DETECTED Final   Streptococcus agalactiae NOT DETECTED NOT DETECTED Final   Streptococcus pneumoniae NOT DETECTED NOT DETECTED Final   Streptococcus pyogenes NOT DETECTED NOT DETECTED Final   A.calcoaceticus-baumannii NOT DETECTED NOT DETECTED Final   Bacteroides fragilis NOT DETECTED NOT DETECTED Final   Enterobacterales DETECTED (A) NOT DETECTED Final    Comment: Enterobacterales represent a large order of gram negative bacteria, not a single organism. CRITICAL RESULT CALLED TO, READ BACK BY AND VERIFIED WITH: PHARMD MICHELLE BELL 01/28/22'@2'$ :21 BY TW    Enterobacter cloacae complex NOT DETECTED NOT DETECTED Final   Escherichia coli DETECTED (A) NOT DETECTED Final    Comment: CRITICAL RESULT CALLED TO, READ BACK BY AND VERIFIED WITH: PHARMD MICHELLE BELL 01/28/22'@2'$ :21 BY TW    Klebsiella aerogenes NOT DETECTED NOT DETECTED Final   Klebsiella oxytoca NOT DETECTED NOT DETECTED Final   Klebsiella pneumoniae NOT DETECTED NOT DETECTED Final   Proteus species NOT DETECTED NOT DETECTED Final   Salmonella species NOT DETECTED NOT DETECTED Final   Serratia marcescens NOT DETECTED NOT DETECTED Final   Haemophilus influenzae NOT DETECTED NOT DETECTED Final   Neisseria meningitidis NOT DETECTED NOT DETECTED Final   Pseudomonas aeruginosa NOT DETECTED NOT DETECTED Final   Stenotrophomonas maltophilia NOT DETECTED NOT DETECTED Final   Candida albicans NOT DETECTED NOT DETECTED Final   Candida auris NOT DETECTED NOT DETECTED Final   Candida glabrata NOT DETECTED NOT DETECTED Final   Candida krusei NOT DETECTED  NOT DETECTED Final   Candida parapsilosis NOT DETECTED NOT DETECTED Final   Candida tropicalis NOT DETECTED NOT DETECTED Final   Cryptococcus neoformans/gattii NOT DETECTED NOT DETECTED Final   CTX-M ESBL NOT DETECTED NOT DETECTED Final   Carbapenem resistance IMP NOT DETECTED NOT DETECTED Final   Carbapenem resistance KPC NOT DETECTED NOT DETECTED Final   Carbapenem resistance NDM NOT  DETECTED NOT DETECTED Final   Carbapenem resist OXA 48 LIKE NOT DETECTED NOT DETECTED Final   Carbapenem resistance VIM NOT DETECTED NOT DETECTED Final    Comment: Performed at [facility], [place] [address], [place], [place] [address]  Urine Culture     Status: Abnormal   Collection Time: 01/27/22  1:22 PM   Specimen: In/Out Cath Urine  Result Value Ref Range Status   Specimen Description   Final    IN/OUT CATH URINE Performed at [facility] [address] [place], [place] [address]    Special Requests   Final    NONE Performed at [facility], [place] [address] [place], [place] [address]    Culture >=100,000 COLONIES/mL ESCHERICHIA COLI (A)  Final   Report Status 01/30/2022 FINAL  Final   Organism ID, Bacteria ESCHERICHIA COLI (A)  Final      Susceptibility   Escherichia coli - MIC*    AMPICILLIN >=32 RESISTANT Resistant     CEFAZOLIN <=4 SENSITIVE Sensitive     CEFEPIME <=0.12 SENSITIVE Sensitive     CEFTRIAXONE <=0.25 SENSITIVE Sensitive     CIPROFLOXACIN <=0.25 SENSITIVE Sensitive     GENTAMICIN <=1 SENSITIVE Sensitive     IMIPENEM <=0.25 SENSITIVE Sensitive     NITROFURANTOIN <=16 SENSITIVE Sensitive     TRIMETH/SULFA <=20 SENSITIVE Sensitive     AMPICILLIN/SULBACTAM 16 INTERMEDIATE Intermediate     PIP/TAZO <=4 SENSITIVE Sensitive     * >=100,000 COLONIES/mL ESCHERICHIA COLI  SARS Coronavirus 2 by RT PCR (hospital order, performed in [facility] hospital lab) *cepheid single result test* Anterior Nasal Swab     Status: None   Collection Time: 01/27/22  4:57 PM   Specimen: Anterior Nasal Swab  Result Value Ref Range Status   SARS Coronavirus 2 by RT PCR NEGATIVE NEGATIVE Final    Comment: (NOTE) SARS-CoV-2 target nucleic acids are NOT DETECTED.  The SARS-CoV-2 RNA is generally detectable in upper and lower respiratory specimens during the acute phase of infection. The lowest concentration of SARS-CoV-2 viral copies this assay  can detect is 250 copies / mL. A negative result does not preclude SARS-CoV-2 infection and should not be used as the sole basis for treatment or other patient management decisions.  A negative result may occur with improper specimen collection / handling, submission of specimen other than nasopharyngeal swab, presence of viral mutation(s) within the areas targeted by this assay, and inadequate number of viral copies (<250 copies / mL). A negative result must be combined with clinical observations, patient history, and epidemiological information.  Fact Sheet for Patients:   https://www.patel.info/  Fact Sheet for Healthcare Providers: https://hall.com/  This test is not yet approved or  cleared by the [place] FDA and has been authorized for detection and/or diagnosis of SARS-CoV-2 by FDA under an Emergency Use Authorization (EUA).  This EUA will remain in effect (meaning this test can be used) for the duration of the COVID-19 declaration under Section 564(b)(1) of the Act, 21 U.S.C. section 360bbb-3(b)(1), unless the authorization is terminated or revoked sooner.  Performed at  [facility], [place] [address], [place], [place] [address]   Respiratory (~20 pathogens) panel by PCR     Status: None   Collection Time: 01/27/22  4:57 PM   Specimen: Nasopharyngeal Swab; Respiratory  Result Value Ref Range Status   Adenovirus NOT DETECTED NOT DETECTED Final   Coronavirus 229E NOT DETECTED NOT DETECTED Final    Comment: (NOTE) The Coronavirus on the Respiratory Panel, DOES NOT test for the novel  Coronavirus (2019 nCoV)    Coronavirus HKU1 NOT DETECTED NOT DETECTED Final   Coronavirus NL63 NOT DETECTED NOT DETECTED Final   Coronavirus OC43 NOT DETECTED NOT DETECTED Final   Metapneumovirus NOT DETECTED NOT DETECTED Final   Rhinovirus / Enterovirus NOT DETECTED NOT DETECTED Final   Influenza A NOT DETECTED NOT DETECTED  Final   Influenza B NOT DETECTED NOT DETECTED Final   Parainfluenza Virus 1 NOT DETECTED NOT DETECTED Final   Parainfluenza Virus 2 NOT DETECTED NOT DETECTED Final   Parainfluenza Virus 3 NOT DETECTED NOT DETECTED Final   Parainfluenza Virus 4 NOT DETECTED NOT DETECTED Final   Respiratory Syncytial Virus NOT DETECTED NOT DETECTED Final   Bordetella pertussis NOT DETECTED NOT DETECTED Final   Bordetella Parapertussis NOT DETECTED NOT DETECTED Final   Chlamydophila pneumoniae NOT DETECTED NOT DETECTED Final   Mycoplasma pneumoniae NOT DETECTED NOT DETECTED Final    Comment: Performed at [facility], [place]. [address], [place], [place] [address]    Antimicrobials: Anti-infectives (From admission, onward)    Start     Dose/Rate Route Frequency Ordered Stop   01/28/22 1400  cefTRIAXone (ROCEPHIN) 2 g in sodium chloride 0.9 % 100 mL IVPB  Status:  Discontinued        2 g 200 mL/hr over 30 Minutes Intravenous Every 24 hours 01/27/22 1904 01/28/22 0228   01/28/22 1400  cefTRIAXone (ROCEPHIN) 2 g in sodium chloride 0.9 % 100 mL IVPB        2 g 200 mL/hr over 30 Minutes Intravenous Every 24 hours 01/28/22 0228     01/27/22 1415  cefTRIAXone (ROCEPHIN) 1 g in sodium chloride 0.9 % 100 mL IVPB        1 g 200 mL/hr over 30 Minutes Intravenous  Once 01/27/22 1407 01/27/22 1451   01/27/22 1230  cefTRIAXone (ROCEPHIN) 1 g in sodium chloride 0.9 % 100 mL IVPB        1 g 200 mL/hr over 30 Minutes Intravenous  Once 01/27/22 1223 01/27/22 1341      Culture/Microbiology    Component Value Date/Time   SDES  01/27/2022 1322    IN/OUT CATH URINE Performed at [facility], [place] [address], [place], [place] [address]    SPECREQUEST  01/27/2022 1322    NONE Performed at [facility], [place] [address], [place], [place] [address]    CULT >=100,000 COLONIES/mL ESCHERICHIA COLI (A) 01/27/2022 1322   REPTSTATUS 01/30/2022 FINAL 01/27/2022 1322    Other culture-see  note  Radiology Studies: CT RENAL STONE STUDY  Result Date: 01/28/2022 CLINICAL DATA:  Flank pain.  Concern for kidney stone. EXAM: CT ABDOMEN AND PELVIS WITHOUT CONTRAST TECHNIQUE: Multidetector CT imaging of the abdomen and pelvis was performed following the standard protocol without IV contrast. RADIATION DOSE REDUCTION: This exam was performed according to the departmental dose-optimization program which includes automated exposure control, adjustment of the mA and/or kV according to patient size and/or use of iterative reconstruction technique. COMPARISON:  None Available.  FINDINGS: Evaluation of this exam is limited in the absence of intravenous contrast. Lower chest: Trace bilateral pleural effusions versus pleural thickening. There are bibasilar scarring. There is calcification of the mitral annulus. No intra-abdominal free air or free fluid. Hepatobiliary: The liver is unremarkable. No intrahepatic biliary dilatation. Multiple gallstones. No pericholecystic fluid or evidence of acute cholecystitis by CT. Pancreas: Unremarkable. No pancreatic ductal dilatation or surrounding inflammatory changes. Spleen: Normal in size without focal abnormality. Adrenals/Urinary Tract: The adrenal glands are unremarkable. There is no hydronephrosis or nephrolithiasis on either side. There is right perinephric and periureteric stranding with minimal fullness of the right ureter. Findings may be related to a recently passed right renal calculus versus pyelonephritis. Correlation with urinalysis recommended. Several bilateral renal cysts measure up to 4.5 cm in the upper pole of the right kidney. Additional subcentimeter hypodense lesions are too small to characterize on this noncontrast CT. This can be better evaluated with ultrasound on a nonemergent/outpatient basis. The bladder is collapsed but poorly visualized due to streak artifact caused by bilateral hip arthroplasties. Stomach/Bowel: There is a small hiatal hernia.  There is no bowel obstruction or active inflammation. No CT evidence of acute appendicitis. Vascular/Lymphatic: Advanced aortoiliac atherosclerotic disease. The IVC is grossly unremarkable. No portal venous gas. There is no adenopathy. Reproductive: Several calcified fibroids.  No adnexal masses. Other: Midline vertical anterior pelvic wall incisional scar. Musculoskeletal: Degenerative changes of the spine. Bilateral total hip arthroplasties. No acute osseous pathology. IMPRESSION: 1. No hydronephrosis or nephrolithiasis. Possible recently passed right renal calculus versus pyelonephritis. Correlation with urinalysis recommended. 2. Cholelithiasis. 3. Aortic Atherosclerosis (ICD10-I70.0). Electronically Signed   By: Anner Crete M.D.   On: 01/28/2022 13:26     LOS: 3 days   Antonieta Pert, MD [facility]  01/30/2022, 11:50 AM

## 2022-01-30 NOTE — Evaluation (Signed)
Physical Therapy Evaluation Patient Details Name: Janet Mccarthy MRN: [id number] DOB: July 22, 1935 Today's Date: 01/30/2022  History of Present Illness  87 yo female admitted with UTI, sepsis, hypokalemia, hyponatremia. Per pt/family report-recent R hip stress fx. Hx of R post THA 2004, dysphagia, CKD, L THA DA 2022  Clinical Impression  On eval, pt was Supv level for mobility. She walked ~75 feet with a RW. Only limited distance 2* pt/family report of hx of R hip stress fx and, per husband, pt was told to be on light duty. Pt participated well. No LOB with activity. Pt denied dizziness. Will plan to follow pt during this hospital stay.        Recommendations for follow up therapy are one component of a multi-disciplinary discharge planning process, led by the attending physician.  Recommendations may be updated based on patient status, additional functional criteria and insurance authorization.  Follow Up Recommendations No PT follow up      Assistance Recommended at Discharge PRN  Patient can return home with the following  Assist for transportation;Assistance with cooking/housework    Equipment Recommendations None recommended by PT  Recommendations for Other Services       Functional Status Assessment Patient has had a recent decline in their functional status and demonstrates the ability to make significant improvements in function in a reasonable and predictable amount of time.     Precautions / Restrictions Precautions Precautions: None Restrictions Weight Bearing Restrictions: No      Mobility  Bed Mobility               General bed mobility comments: oob in recliner    Transfers Overall transfer level: Needs assistance Equipment used: Rolling walker (2 wheels) Transfers: Sit to/from Stand Sit to Stand: Modified independent (Device/Increase time)                Ambulation/Gait Ambulation/Gait assistance: Supervision Gait Distance (Feet): 75  Feet Assistive device: Rolling walker (2 wheels) Gait Pattern/deviations: Step-through pattern, Decreased stride length       General Gait Details: uses RW for ambulation safety only due to pt report of hx of R hip stress fx. Pt reports she uses rollator vs cane  Stairs            Wheelchair Mobility    Modified Rankin (Stroke Patients Only)       Balance Overall balance assessment: Mild deficits observed, not formally tested                                           Pertinent Vitals/Pain Pain Assessment Pain Assessment: No/denies pain    Home Living Family/patient expects to be discharged to:: Private residence Living Arrangements: Spouse/significant other Available Help at Discharge: Family Type of Home: House Home Access: Stairs to enter   [employer] of Steps: 2 - garage rail/ porch 3 steps with one rail   Home Layout: One level;Able to live on main level with bedroom/bathroom Home Equipment: Rolling Walker (2 wheels);Rollator (4 wheels);Cane - single point      Prior Function Prior Level of Function : Independent/Modified Independent             Mobility Comments: using RW vs cane  d/t stress fx R--?femur ADLs Comments: independent     Hand Dominance        Extremity/Trunk Assessment   Upper Extremity Assessment Upper Extremity  Assessment: Overall WFL for tasks assessed    Lower Extremity Assessment Lower Extremity Assessment: Overall WFL for tasks assessed    Cervical / Trunk Assessment Cervical / Trunk Assessment: Normal  Communication   Communication: No difficulties  Cognition Arousal/Alertness: Awake/alert Behavior During Therapy: WFL for tasks assessed/performed Overall Cognitive Status: Within Functional Limits for tasks assessed                                          General Comments      Exercises     Assessment/Plan    PT Assessment Patient needs continued PT services   PT Problem List Decreased strength;Decreased mobility;Decreased activity tolerance;Decreased balance;Decreased knowledge of use of DME       PT Treatment Interventions DME instruction;Gait training;Therapeutic activities;Therapeutic exercise;Patient/family education;Balance training;Functional mobility training    PT Goals (Current goals can be found in the Care Plan section)  Acute Rehab PT Goals Patient Stated Goal: home soon PT Goal Formulation: With patient Time For Goal Achievement: 02/13/22 Potential to Achieve Goals: Good    Frequency Min 3X/week     Co-evaluation               AM-PAC PT "6 Clicks" Mobility  Outcome Measure Help needed turning from your back to your side while in a flat bed without using bedrails?: None Help needed moving from lying on your back to sitting on the side of a flat bed without using bedrails?: None Help needed moving to and from a bed to a chair (including a wheelchair)?: None Help needed standing up from a chair using your arms (e.g., wheelchair or bedside chair)?: None Help needed to walk in hospital room?: A Little Help needed climbing 3-5 steps with a railing? : A Little 6 Click Score: 22    End of Session   Activity Tolerance: Patient tolerated treatment well Patient left: in chair;with call bell/phone within reach;with family/visitor present   PT Visit Diagnosis: Unsteadiness on feet (R26.81)    Time: 0272-5366 PT Time Calculation (min) (ACUTE ONLY): 9 min   Charges:   PT Evaluation $PT Eval Low Complexity: [place], PT Acute Rehabilitation  Office: [phone] Pager: [phone]

## 2022-01-31 DIAGNOSIS — R6520 Severe sepsis without septic shock: Secondary | ICD-10-CM | POA: Diagnosis not present

## 2022-01-31 DIAGNOSIS — A4150 Gram-negative sepsis, unspecified: Secondary | ICD-10-CM | POA: Diagnosis not present

## 2022-01-31 LAB — BASIC METABOLIC PANEL
Anion gap: 7 (ref 5–15)
BUN: 17 mg/dL (ref 8–23)
CO2: 25 mmol/L (ref 22–32)
Calcium: 9.6 mg/dL (ref 8.9–10.3)
Chloride: 102 mmol/L (ref 98–111)
Creatinine, Ser: 1.21 mg/dL — ABNORMAL HIGH (ref 0.44–1.00)
GFR, Estimated: 44 mL/min — ABNORMAL LOW (ref >60)
Glucose, Bld: 105 mg/dL — ABNORMAL HIGH (ref 70–99)
Potassium: 3.8 mmol/L (ref 3.5–5.1)
Sodium: 134 mmol/L — ABNORMAL LOW (ref 135–145)

## 2022-01-31 LAB — CULTURE, BLOOD (ROUTINE X 2): Special Requests: ADEQUATE

## 2022-01-31 LAB — CBC
HCT: 29.1 % — ABNORMAL LOW (ref 36.0–46.0)
Hemoglobin: 9 g/dL — ABNORMAL LOW (ref 12.0–15.0)
MCH: 29.7 pg (ref 26.0–34.0)
MCHC: 30.9 g/dL (ref 30.0–36.0)
MCV: 96 fL (ref 80.0–100.0)
Platelets: 250 10*3/uL (ref 150–400)
RBC: 3.03 MIL/uL — ABNORMAL LOW (ref 3.87–5.11)
RDW: 15.6 % — ABNORMAL HIGH (ref 11.5–15.5)
WBC: 10.6 10*3/uL — ABNORMAL HIGH (ref 4.0–10.5)
nRBC: 0 % (ref 0.0–0.2)

## 2022-01-31 MED ORDER — CEFADROXIL 500 MG PO CAPS: 500.0000 mg | ORAL_CAPSULE | Freq: Two times a day (BID) | ORAL | 0 refills | Status: DC

## 2022-01-31 MED ORDER — SACCHAROMYCES BOULARDII 250 MG PO CAPS: 250.0000 mg | ORAL_CAPSULE | Freq: Two times a day (BID) | ORAL | 0 refills | Status: DC

## 2022-01-31 MED ORDER — CEFADROXIL 500 MG PO CAPS
500.0000 mg | ORAL_CAPSULE | Freq: Two times a day (BID) | ORAL | Status: DC
  Filled 2022-01-31: qty 1

## 2022-01-31 NOTE — Discharge Summary (Signed)
Physician Discharge Summary  Janet Mccarthy OIZ:[id number] DOB: [birth year] DOA: 01/27/2022  PCP: Crist Infante, MD  Admit date: 01/27/2022 Discharge date: 01/31/2022 Recommendations for Outpatient Follow-up:  Follow up with PCP in 1 weeks-call for appointment Please obtain BMP/CBC in one week  Discharge Dispo: home Discharge Condition: Stable Code Status:   Code Status: Full Code Diet recommendation:  Diet Order             Diet regular Room service appropriate? Yes; Fluid consistency: Thin  Diet effective now           Diet general                    Brief/Interim Summary: 87 year old female with past medical history of hypertension, hypothyroidism, CKD stage III AAA baseline creatinine 1.0, previous leukocytosis 16 K 06/02/2021, presents for evaluation of fever for last 4 to 5 days , poor oral intake. She was seen by PCP diagnosed with UTI given normal saline 1 g Rocephin and sent to the ED for urosepsis. UA with WBC more than 50, labs with leukocytosis 25 K, severe hypokalemia, hyponatremia, creatinine 1.6.  Received additional 1 g ceftriaxone.  Treated with 60 mEq x 2 ordered.  Blood culture sent.  Lactic acid ordered and admission requested for further management. Patient remained hemodynamic stable overnight, blood culture with 2/4 bottle anaerobic w E. Coli in BCID, urine culture also grew E. Coli.  Patient is remarkably improved with IV antibiotics AKI is resolved leukocytosis almost normalized.  Mental status stable.PT requested for disposition.  Once culture resulted she will be discharged home.Urine culture w/ e coli sensitive to  cefazolin, rocephin, bactrim.  Waiting for blood culture final report afterwards patient be discharged home to complete the course.  Discussed about different measures to minimize UTI in the future and encouraged to seek early medical attention.   Discharge Diagnoses:  Principal Problem:   Severe sepsis with acute organ dysfunction due to Gram  negative bacteria (HCC) Active Problems:   Esophageal dysphagia   OA (osteoarthritis) of hip   Hypokalemia   Acute renal failure superimposed on stage 3a chronic kidney disease (HCC)   Leucocytosis   Hyponatremia   E coli bacteremia   Hyperkalemia   Pyelonephritis of right kidney  Severe sepsis secondary to E. coli  Right pyelonephritis/UTI due to E. coli Sepsis resolved.afebrile and bp stable.Suspect patient's sepsis is due to pyelo and E. coli UTI.  Culture sensitivity finalized on urien and blood- we will dc on cefadroxil x 10 days -discussed with pahramcy.  Severe hypokalemia -resolved Hyperkalemia: resolved History of esophageal dysphagia-none currently per patient Continue PPI, tolerating diet. Acute renal failure superimposed on stage 3a chronic kidney disease:AKI improved- cont po hydration- bmp in 1 wk from pcp and hold hctz HTN: Remains stable continue to hold HCTZ UNTIL SEEN BY PCP. Hypovolemic hyponatremia: Resolved  Consults: NONE Subjective: AAOX3, no fever no complaints Eager to go home  Discharge Exam: Vitals:   01/30/22 2014 01/31/22 0451  BP: (!) 149/60 (!) 128/57  Pulse: 90 85  Resp: 20 20  Temp: 98.6 F (37 C) 98.5 F (36.9 C)  SpO2: 96% 96%   General: Pt is alert, awake, not in acute distress Cardiovascular: RRR, S1/S2 +, no rubs, no gallops Respiratory: CTA bilaterally, no wheezing, no rhonchi Abdominal: Soft, NT, ND, bowel sounds + Extremities: no edema, no cyanosis  Discharge Instructions  Discharge Instructions     Diet general   Complete by: As directed  Discharge instructions   Complete by: As directed    Holding you hydrochlorothiazide until reevaluated by your primary care doctor in a week, please see pcp in a week Check bmp in a week  Please call call MD or return to ER for similar or worsening recurring problem that brought you to hospital or if any fever,nausea/vomiting,abdominal pain, uncontrolled pain, chest pain,  shortness  of breath or any other alarming symptoms.  Please follow-up your doctor as instructed in a week time and call the office for appointment.  Please avoid alcohol, smoking, or any other illicit substance and maintain healthy habits including taking your regular medications as prescribed.  You were cared for by a hospitalist during your hospital stay. If you have any questions about your discharge medications or the care you received while you were in the hospital after you are discharged, you can call the unit and ask to speak with the hospitalist on call if the hospitalist that took care of you is not available.  Once you are discharged, your primary care physician will handle any further medical issues. Please note that NO REFILLS for any discharge medications will be authorized once you are discharged, as it is imperative that you return to your primary care physician (or establish a relationship with a primary care physician if you do not have one) for your aftercare needs so that they can reassess your need for medications and monitor your lab values   Increase activity slowly   Complete by: As directed       Allergies as of 01/31/2022       Reactions   Omeprazole    Other reaction(s): abd pain   Rabeprazole    Other reaction(s): lip redness and swelling   Sulfa Antibiotics Itching   Other reaction(s): vomiting   Umeclidinium-vilanterol    Other reaction(s): urine frequency        Medication List     STOP taking these medications    hydrochlorothiazide 12.5 MG capsule Commonly known as: MICROZIDE       TAKE these medications    acetaminophen 500 MG tablet Commonly known as: TYLENOL Take 500 mg by mouth every 6 (six) hours as needed for fever or moderate pain.   amitriptyline 50 MG tablet Commonly known as: ELAVIL Take 50 mg by mouth daily.   cefadroxil 500 MG capsule Commonly known as: DURICEF Take 1 capsule (500 mg total) by mouth 2 (two) times daily for 10  days.   clobetasol cream 0.05 % Commonly known as: TEMOVATE Apply 1 application topically daily as needed (irritation).   levothyroxine 75 MCG tablet Commonly known as: SYNTHROID Take 75 mcg by mouth daily before breakfast.   pantoprazole 40 MG tablet Commonly known as: PROTONIX Take 1 tablet (40 mg total) by mouth daily. Office visit for further refills What changed: additional instructions   Pfizer COVID-19 Vac Bivalent injection Generic drug: COVID-19 mRNA bivalent vaccine (Pfizer) Inject into the muscle.   saccharomyces boulardii 250 MG capsule Commonly known as: Florastor Take 1 capsule (250 mg total) by mouth 2 (two) times daily for 14 days.   sodium chloride 0.65 % Soln nasal spray Commonly known as: OCEAN Place 1 spray into both nostrils daily as needed for congestion.   traMADol 50 MG tablet Commonly known as: ULTRAM Take 1-2 tablets (50-100 mg total) by mouth every 6 (six) hours as needed for moderate pain.        Allergies  Allergen Reactions   Omeprazole  Other reaction(s): abd pain   Rabeprazole     Other reaction(s): lip redness and swelling   Sulfa Antibiotics Itching    Other reaction(s): vomiting   Umeclidinium-Vilanterol     Other reaction(s): urine frequency    The results of significant diagnostics from this hospitalization (including imaging, microbiology, ancillary and laboratory) are listed below for reference.    Microbiology: Recent Results (from the past 240 hour(s))  Blood culture (routine x 2)     Status: Abnormal   Collection Time: 01/27/22 12:58 PM   Specimen: BLOOD  Result Value Ref Range Status   Specimen Description   Final    BLOOD SITE NOT SPECIFIED Performed at [facility] [address], [place], [place] [address]    Special Requests   Final    BOTTLES DRAWN AEROBIC AND ANAEROBIC Blood Culture results may not be optimal due to an excessive volume of blood received in culture bottles Performed at  [facility] [address], [place], [place] [address]    Culture  Setup Time   Final    GRAM NEGATIVE RODS IN BOTH AEROBIC AND ANAEROBIC BOTTLES CRITICAL VALUE NOTED.  VALUE IS CONSISTENT WITH PREVIOUSLY REPORTED AND CALLED VALUE.    Culture (A)  Final    ESCHERICHIA COLI SUSCEPTIBILITIES PERFORMED ON PREVIOUS CULTURE WITHIN THE LAST 5 DAYS. Performed at [facility], [place] [address] [place], [place] [address]    Report Status 01/31/2022 FINAL  Final  Blood culture (routine x 2)     Status: Abnormal   Collection Time: 01/27/22 12:58 PM   Specimen: BLOOD  Result Value Ref Range Status   Specimen Description   Final    BLOOD SITE NOT SPECIFIED Performed at [facility] [address] [place], [place] [address]    Special Requests   Final    BOTTLES DRAWN AEROBIC AND ANAEROBIC Blood Culture adequate volume Performed at [facility] [address], [place],  [address]    Culture  Setup Time   Final    GRAM NEGATIVE RODS CRITICAL RESULT CALLED TO, READ BACK BY AND VERIFIED WITH: PHARMD MICHELLE BELL 01/28/22'@2'$ :21 BY TW IN BOTH AEROBIC AND ANAEROBIC BOTTLES Performed at [facility], [place] [address] [place], [place] [address]    Culture ESCHERICHIA COLI (A)  Final   Report Status 01/31/2022 FINAL  Final   Organism ID, Bacteria ESCHERICHIA COLI  Final      Susceptibility   Escherichia coli - MIC*    AMPICILLIN >=32 RESISTANT Resistant     CEFAZOLIN <=4 SENSITIVE Sensitive     CEFEPIME <=0.12 SENSITIVE Sensitive     CEFTAZIDIME <=1 SENSITIVE Sensitive     CEFTRIAXONE <=0.25 SENSITIVE Sensitive     CIPROFLOXACIN <=0.25 SENSITIVE Sensitive     GENTAMICIN <=1 SENSITIVE Sensitive     IMIPENEM <=0.25 SENSITIVE Sensitive     TRIMETH/SULFA <=20 SENSITIVE Sensitive     AMPICILLIN/SULBACTAM >=32 RESISTANT Resistant     PIP/TAZO <=4 SENSITIVE Sensitive     * ESCHERICHIA COLI  Blood Culture ID Panel  (Reflexed)     Status: Abnormal   Collection Time: 01/27/22 12:58 PM  Result Value Ref Range Status   Enterococcus faecalis NOT DETECTED NOT DETECTED Final   Enterococcus Faecium NOT DETECTED NOT DETECTED Final   Listeria monocytogenes NOT DETECTED NOT DETECTED Final   Staphylococcus species NOT DETECTED NOT DETECTED Final   Staphylococcus aureus (BCID) NOT DETECTED NOT DETECTED Final  Staphylococcus epidermidis NOT DETECTED NOT DETECTED Final   Staphylococcus lugdunensis NOT DETECTED NOT DETECTED Final   Streptococcus species NOT DETECTED NOT DETECTED Final   Streptococcus agalactiae NOT DETECTED NOT DETECTED Final   Streptococcus pneumoniae NOT DETECTED NOT DETECTED Final   Streptococcus pyogenes NOT DETECTED NOT DETECTED Final   A.calcoaceticus-baumannii NOT DETECTED NOT DETECTED Final   Bacteroides fragilis NOT DETECTED NOT DETECTED Final   Enterobacterales DETECTED (A) NOT DETECTED Final    Comment: Enterobacterales represent a large order of gram negative bacteria, not a single organism. CRITICAL RESULT CALLED TO, READ BACK BY AND VERIFIED WITH: PHARMD MICHELLE BELL 01/28/22'@2'$ :21 BY TW    Enterobacter cloacae complex NOT DETECTED NOT DETECTED Final   Escherichia coli DETECTED (A) NOT DETECTED Final    Comment: CRITICAL RESULT CALLED TO, READ BACK BY AND VERIFIED WITH: PHARMD MICHELLE BELL 01/28/22'@2'$ :21 BY TW    Klebsiella aerogenes NOT DETECTED NOT DETECTED Final   Klebsiella oxytoca NOT DETECTED NOT DETECTED Final   Klebsiella pneumoniae NOT DETECTED NOT DETECTED Final   Proteus species NOT DETECTED NOT DETECTED Final   Salmonella species NOT DETECTED NOT DETECTED Final   Serratia marcescens NOT DETECTED NOT DETECTED Final   Haemophilus influenzae NOT DETECTED NOT DETECTED Final   Neisseria meningitidis NOT DETECTED NOT DETECTED Final   Pseudomonas aeruginosa NOT DETECTED NOT DETECTED Final   Stenotrophomonas maltophilia NOT DETECTED NOT DETECTED Final   Candida albicans NOT  DETECTED NOT DETECTED Final   Candida auris NOT DETECTED NOT DETECTED Final   Candida glabrata NOT DETECTED NOT DETECTED Final   Candida krusei NOT DETECTED NOT DETECTED Final   Candida parapsilosis NOT DETECTED NOT DETECTED Final   Candida tropicalis NOT DETECTED NOT DETECTED Final   Cryptococcus neoformans/gattii NOT DETECTED NOT DETECTED Final   CTX-M ESBL NOT DETECTED NOT DETECTED Final   Carbapenem resistance IMP NOT DETECTED NOT DETECTED Final   Carbapenem resistance KPC NOT DETECTED NOT DETECTED Final   Carbapenem resistance NDM NOT DETECTED NOT DETECTED Final   Carbapenem resist OXA 48 LIKE NOT DETECTED NOT DETECTED Final   Carbapenem resistance VIM NOT DETECTED NOT DETECTED Final    Comment: Performed at [facility], [address] [place], [place] [address]  Urine Culture     Status: Abnormal   Collection Time: 01/27/22  1:22 PM   Specimen: In/Out Cath Urine  Result Value Ref Range Status   Specimen Description   Final    IN/OUT CATH URINE Performed at [facility] [address], [place], [place] [address]    Special Requests   Final    NONE Performed at [facility], [place] [address], [place], [place] [address]    Culture >=100,000 COLONIES/mL ESCHERICHIA COLI (A)  Final   Report Status 01/30/2022 FINAL  Final   Organism ID, Bacteria ESCHERICHIA COLI (A)  Final      Susceptibility   Escherichia coli - MIC*    AMPICILLIN >=32 RESISTANT Resistant     CEFAZOLIN <=4 SENSITIVE Sensitive     CEFEPIME <=0.12 SENSITIVE Sensitive     CEFTRIAXONE <=0.25 SENSITIVE Sensitive     CIPROFLOXACIN <=0.25 SENSITIVE Sensitive     GENTAMICIN <=1 SENSITIVE Sensitive     IMIPENEM <=0.25 SENSITIVE Sensitive     NITROFURANTOIN <=16 SENSITIVE Sensitive     TRIMETH/SULFA <=20 SENSITIVE Sensitive     AMPICILLIN/SULBACTAM 16 INTERMEDIATE Intermediate     PIP/TAZO <=4 SENSITIVE Sensitive     * >=100,000 COLONIES/mL ESCHERICHIA COLI  SARS  Coronavirus 2 by RT PCR (hospital order, performed in [facility] hospital lab) *cepheid single result test* Anterior Nasal Swab     Status: None   Collection Time: 01/27/22  4:57 PM   Specimen: Anterior Nasal Swab  Result Value Ref Range Status   SARS Coronavirus 2 by RT PCR NEGATIVE NEGATIVE Final    Comment: (NOTE) SARS-CoV-2 target nucleic acids are NOT DETECTED.  The SARS-CoV-2 RNA is generally detectable in upper and lower respiratory specimens during the acute phase of infection. The lowest concentration of SARS-CoV-2 viral copies this assay can detect is 250 copies / mL. A negative result does not preclude SARS-CoV-2 infection and should not be used as the sole basis for treatment or other patient management decisions.  A negative result may occur with improper specimen collection / handling, submission of specimen other than nasopharyngeal swab, presence of viral mutation(s) within the areas targeted by this assay, and inadequate number of viral copies (<250 copies / mL). A negative result must be combined with clinical observations, patient history, and epidemiological information.  Fact Sheet for Patients:   https://www.patel.info/  Fact Sheet for Healthcare Providers: https://hall.com/  This test is not yet approved or  cleared by the [place] FDA and has been authorized for detection and/or diagnosis of SARS-CoV-2 by FDA under an Emergency Use Authorization (EUA).  This EUA will remain in effect (meaning this test can be used) for the duration of the COVID-19 declaration under Section 564(b)(1) of the Act, 21 U.S.C. section 360bbb-3(b)(1), unless the authorization is terminated or revoked sooner.  Performed at [facility], [place] [address], [place], [place] [address]   Respiratory (~20 pathogens) panel by PCR     Status: None   Collection Time: 01/27/22  4:57 PM   Specimen: Nasopharyngeal Swab;  Respiratory  Result Value Ref Range Status   Adenovirus NOT DETECTED NOT DETECTED Final   Coronavirus 229E NOT DETECTED NOT DETECTED Final    Comment: (NOTE) The Coronavirus on the Respiratory Panel, DOES NOT test for the novel  Coronavirus (2019 nCoV)    Coronavirus HKU1 NOT DETECTED NOT DETECTED Final   Coronavirus NL63 NOT DETECTED NOT DETECTED Final   Coronavirus OC43 NOT DETECTED NOT DETECTED Final   Metapneumovirus NOT DETECTED NOT DETECTED Final   Rhinovirus / Enterovirus NOT DETECTED NOT DETECTED Final   Influenza A NOT DETECTED NOT DETECTED Final   Influenza B NOT DETECTED NOT DETECTED Final   Parainfluenza Virus 1 NOT DETECTED NOT DETECTED Final   Parainfluenza Virus 2 NOT DETECTED NOT DETECTED Final   Parainfluenza Virus 3 NOT DETECTED NOT DETECTED Final   Parainfluenza Virus 4 NOT DETECTED NOT DETECTED Final   Respiratory Syncytial Virus NOT DETECTED NOT DETECTED Final   Bordetella pertussis NOT DETECTED NOT DETECTED Final   Bordetella Parapertussis NOT DETECTED NOT DETECTED Final   Chlamydophila pneumoniae NOT DETECTED NOT DETECTED Final   Mycoplasma pneumoniae NOT DETECTED NOT DETECTED Final    Comment: Performed at [facility], [place] [address] [place], [place] [address]    Procedures/Studies: CT RENAL STONE STUDY  Result Date: 01/28/2022 CLINICAL DATA:  Flank pain.  Concern for kidney stone. EXAM: CT ABDOMEN AND PELVIS WITHOUT CONTRAST TECHNIQUE: Multidetector CT imaging of the abdomen and pelvis was performed following the standard protocol without IV contrast. RADIATION DOSE REDUCTION: This exam was performed according to the departmental dose-optimization program which includes automated exposure control, adjustment of the mA and/or kV according to patient size and/or  use of iterative reconstruction technique. COMPARISON:  None Available. FINDINGS: Evaluation of this exam is limited in the absence of intravenous contrast. Lower chest: Trace bilateral pleural  effusions versus pleural thickening. There are bibasilar scarring. There is calcification of the mitral annulus. No intra-abdominal free air or free fluid. Hepatobiliary: The liver is unremarkable. No intrahepatic biliary dilatation. Multiple gallstones. No pericholecystic fluid or evidence of acute cholecystitis by CT. Pancreas: Unremarkable. No pancreatic ductal dilatation or surrounding inflammatory changes. Spleen: Normal in size without focal abnormality. Adrenals/Urinary Tract: The adrenal glands are unremarkable. There is no hydronephrosis or nephrolithiasis on either side. There is right perinephric and periureteric stranding with minimal fullness of the right ureter. Findings may be related to a recently passed right renal calculus versus pyelonephritis. Correlation with urinalysis recommended. Several bilateral renal cysts measure up to 4.5 cm in the upper pole of the right kidney. Additional subcentimeter hypodense lesions are too small to characterize on this noncontrast CT. This can be better evaluated with ultrasound on a nonemergent/outpatient basis. The bladder is collapsed but poorly visualized due to streak artifact caused by bilateral hip arthroplasties. Stomach/Bowel: There is a small hiatal hernia. There is no bowel obstruction or active inflammation. No CT evidence of acute appendicitis. Vascular/Lymphatic: Advanced aortoiliac atherosclerotic disease. The IVC is grossly unremarkable. No portal venous gas. There is no adenopathy. Reproductive: Several calcified fibroids.  No adnexal masses. Other: Midline vertical anterior pelvic wall incisional scar. Musculoskeletal: Degenerative changes of the spine. Bilateral total hip arthroplasties. No acute osseous pathology. IMPRESSION: 1. No hydronephrosis or nephrolithiasis. Possible recently passed right renal calculus versus pyelonephritis. Correlation with urinalysis recommended. 2. Cholelithiasis. 3. Aortic Atherosclerosis (ICD10-I70.0).  Electronically Signed   By: Anner Crete M.D.   On: 01/28/2022 13:26   DG Chest Port 1 View  Result Date: 01/27/2022 CLINICAL DATA:  Fever, sepsis EXAM: PORTABLE CHEST 1 VIEW COMPARISON:  05/28/2004 FINDINGS: The heart size and mediastinal contours are within normal limits. Both lungs are clear. The visualized skeletal structures are unremarkable. IMPRESSION: No active disease. Electronically Signed   By: Franchot Gallo M.D.   On: 01/27/2022 17:34    Labs: BNP (last 3 results) No results for input(s): "BNP" in the last 8760 hours. Basic Metabolic Panel: Recent Labs  Lab 01/27/22 1258 01/28/22 0430 01/28/22 0903 01/29/22 0445 01/30/22 0539 01/31/22 0536  NA 132* 141  --  136 132* 134*  K 2.9* 6.0* 5.0 4.3 3.9 3.8  CL 93* 108  --  106 102 102  CO2 27 26  --  '25 24 25  '$ GLUCOSE 140* 115*  --  113* 110* 105*  BUN 35* 29*  --  '19 19 17  '$ CREATININE 1.61* 1.43*  --  1.25* 1.12* 1.21*  CALCIUM 9.9 9.8  --  9.7 9.5 9.6  MG 1.9  --   --   --   --   --    Liver Function Tests: No results for input(s): "AST", "ALT", "ALKPHOS", "BILITOT", "PROT", "ALBUMIN" in the last 168 hours. No results for input(s): "LIPASE", "AMYLASE" in the last 168 hours. No results for input(s): "AMMONIA" in the last 168 hours. CBC: Recent Labs  Lab 01/27/22 1258 01/28/22 0430 01/29/22 0445 01/30/22 0539 01/31/22 0536  WBC 25.3* 16.4* 12.9* 10.9* 10.6*  HGB 12.0 10.3* 9.6* 9.2* 9.0*  HCT 37.9 33.2* 31.3* 29.2* 29.1*  MCV 94.5 96.2 96.6 95.1 96.0  PLT 243 188 192 209 250   Cardiac Enzymes: No results for input(s): "CKTOTAL", "CKMB", "CKMBINDEX", "TROPONINI" in  the last 168 hours. BNP: Invalid input(s): "POCBNP" CBG: No results for input(s): "GLUCAP" in the last 168 hours. D-Dimer No results for input(s): "DDIMER" in the last 72 hours. Hgb A1c No results for input(s): "HGBA1C" in the last 72 hours. Lipid Profile No results for input(s): "CHOL", "HDL", "LDLCALC", "TRIG", "CHOLHDL", "LDLDIRECT" in  the last 72 hours. Thyroid function studies No results for input(s): "TSH", "T4TOTAL", "T3FREE", "THYROIDAB" in the last 72 hours.  Invalid input(s): "FREET3" Anemia work up No results for input(s): "VITAMINB12", "FOLATE", "FERRITIN", "TIBC", "IRON", "RETICCTPCT" in the last 72 hours. Urinalysis    Component Value Date/Time   COLORURINE YELLOW 01/27/2022 1322   APPEARANCEUR CLOUDY (A) 01/27/2022 1322   LABSPEC 1.016 01/27/2022 1322   PHURINE 5.0 01/27/2022 1322   GLUCOSEU NEGATIVE 01/27/2022 1322   HGBUR SMALL (A) 01/27/2022 1322   BILIRUBINUR NEGATIVE 01/27/2022 1322   KETONESUR NEGATIVE 01/27/2022 1322   PROTEINUR 30 (A) 01/27/2022 1322   NITRITE NEGATIVE 01/27/2022 1322   LEUKOCYTESUR LARGE (A) 01/27/2022 1322   Sepsis Labs Recent Labs  Lab 01/28/22 0430 01/29/22 0445 01/30/22 0539 01/31/22 0536  WBC 16.4* 12.9* 10.9* 10.6*   Microbiology Recent Results (from the past 240 hour(s))  Blood culture (routine x 2)     Status: Abnormal   Collection Time: 01/27/22 12:58 PM   Specimen: BLOOD  Result Value Ref Range Status   Specimen Description   Final    BLOOD SITE NOT SPECIFIED Performed at [facility], [place] [address], [place], [place] [address]    Special Requests   Final    BOTTLES DRAWN AEROBIC AND ANAEROBIC Blood Culture results may not be optimal due to an excessive volume of blood received in culture bottles Performed at [facility] [address], [place], [place] [address]    Culture  Setup Time   Final    GRAM NEGATIVE RODS IN BOTH AEROBIC AND ANAEROBIC BOTTLES CRITICAL VALUE NOTED.  VALUE IS CONSISTENT WITH PREVIOUSLY REPORTED AND CALLED VALUE.    Culture (A)  Final    ESCHERICHIA COLI SUSCEPTIBILITIES PERFORMED ON PREVIOUS CULTURE WITHIN THE LAST 5 DAYS. Performed at [facility], [place] [address], [place], [place] [address]    Report Status 01/31/2022 FINAL  Final  Blood culture (routine x 2)     Status:  Abnormal   Collection Time: 01/27/22 12:58 PM   Specimen: BLOOD  Result Value Ref Range Status   Specimen Description   Final    BLOOD SITE NOT SPECIFIED Performed at [facility] [address], [place], [place] [address]    Special Requests   Final    BOTTLES DRAWN AEROBIC AND ANAEROBIC Blood Culture adequate volume Performed at [facility] [address] [place], [place] [address]    Culture  Setup Time   Final    GRAM NEGATIVE RODS CRITICAL RESULT CALLED TO, READ BACK BY AND VERIFIED WITH: PHARMD MICHELLE BELL 01/28/22'@2'$ :21 BY TW IN BOTH AEROBIC AND ANAEROBIC BOTTLES Performed at [facility], [place] [address] [place], [place] [address]    Culture ESCHERICHIA COLI (A)  Final   Report Status 01/31/2022 FINAL  Final   Organism ID, Bacteria ESCHERICHIA COLI  Final      Susceptibility   Escherichia coli - MIC*    AMPICILLIN >=32 RESISTANT Resistant     CEFAZOLIN <=4 SENSITIVE Sensitive     CEFEPIME <=0.12 SENSITIVE Sensitive     CEFTAZIDIME <=1 SENSITIVE Sensitive  CEFTRIAXONE <=0.25 SENSITIVE Sensitive     CIPROFLOXACIN <=0.25 SENSITIVE Sensitive     GENTAMICIN <=1 SENSITIVE Sensitive     IMIPENEM <=0.25 SENSITIVE Sensitive     TRIMETH/SULFA <=20 SENSITIVE Sensitive     AMPICILLIN/SULBACTAM >=32 RESISTANT Resistant     PIP/TAZO <=4 SENSITIVE Sensitive     * ESCHERICHIA COLI  Blood Culture ID Panel (Reflexed)     Status: Abnormal   Collection Time: 01/27/22 12:58 PM  Result Value Ref Range Status   Enterococcus faecalis NOT DETECTED NOT DETECTED Final   Enterococcus Faecium NOT DETECTED NOT DETECTED Final   Listeria monocytogenes NOT DETECTED NOT DETECTED Final   Staphylococcus species NOT DETECTED NOT DETECTED Final   Staphylococcus aureus (BCID) NOT DETECTED NOT DETECTED Final   Staphylococcus epidermidis NOT DETECTED NOT DETECTED Final   Staphylococcus lugdunensis NOT DETECTED NOT DETECTED Final   Streptococcus species  NOT DETECTED NOT DETECTED Final   Streptococcus agalactiae NOT DETECTED NOT DETECTED Final   Streptococcus pneumoniae NOT DETECTED NOT DETECTED Final   Streptococcus pyogenes NOT DETECTED NOT DETECTED Final   A.calcoaceticus-baumannii NOT DETECTED NOT DETECTED Final   Bacteroides fragilis NOT DETECTED NOT DETECTED Final   Enterobacterales DETECTED (A) NOT DETECTED Final    Comment: Enterobacterales represent a large order of gram negative bacteria, not a single organism. CRITICAL RESULT CALLED TO, READ BACK BY AND VERIFIED WITH: PHARMD MICHELLE BELL 01/28/22'@2'$ :21 BY TW    Enterobacter cloacae complex NOT DETECTED NOT DETECTED Final   Escherichia coli DETECTED (A) NOT DETECTED Final    Comment: CRITICAL RESULT CALLED TO, READ BACK BY AND VERIFIED WITH: PHARMD MICHELLE BELL 01/28/22'@2'$ :21 BY TW    Klebsiella aerogenes NOT DETECTED NOT DETECTED Final   Klebsiella oxytoca NOT DETECTED NOT DETECTED Final   Klebsiella pneumoniae NOT DETECTED NOT DETECTED Final   Proteus species NOT DETECTED NOT DETECTED Final   Salmonella species NOT DETECTED NOT DETECTED Final   Serratia marcescens NOT DETECTED NOT DETECTED Final   Haemophilus influenzae NOT DETECTED NOT DETECTED Final   Neisseria meningitidis NOT DETECTED NOT DETECTED Final   Pseudomonas aeruginosa NOT DETECTED NOT DETECTED Final   Stenotrophomonas maltophilia NOT DETECTED NOT DETECTED Final   Candida albicans NOT DETECTED NOT DETECTED Final   Candida auris NOT DETECTED NOT DETECTED Final   Candida glabrata NOT DETECTED NOT DETECTED Final   Candida krusei NOT DETECTED NOT DETECTED Final   Candida parapsilosis NOT DETECTED NOT DETECTED Final   Candida tropicalis NOT DETECTED NOT DETECTED Final   Cryptococcus neoformans/gattii NOT DETECTED NOT DETECTED Final   CTX-M ESBL NOT DETECTED NOT DETECTED Final   Carbapenem resistance IMP NOT DETECTED NOT DETECTED Final   Carbapenem resistance KPC NOT DETECTED NOT DETECTED Final   Carbapenem  resistance NDM NOT DETECTED NOT DETECTED Final   Carbapenem resist OXA 48 LIKE NOT DETECTED NOT DETECTED Final   Carbapenem resistance VIM NOT DETECTED NOT DETECTED Final    Comment: Performed at [facility], [address], [place], [place] [address]  Urine Culture     Status: Abnormal   Collection Time: 01/27/22  1:22 PM   Specimen: In/Out Cath Urine  Result Value Ref Range Status   Specimen Description   Final    IN/OUT CATH URINE Performed at [facility] [address], [place], [place] [address]    Special Requests   Final    NONE Performed at [facility], [place] [address] [place], [place] [address]    Culture >=  100,000 COLONIES/mL ESCHERICHIA COLI (A)  Final   Report Status 01/30/2022 FINAL  Final   Organism ID, Bacteria ESCHERICHIA COLI (A)  Final      Susceptibility   Escherichia coli - MIC*    AMPICILLIN >=32 RESISTANT Resistant     CEFAZOLIN <=4 SENSITIVE Sensitive     CEFEPIME <=0.12 SENSITIVE Sensitive     CEFTRIAXONE <=0.25 SENSITIVE Sensitive     CIPROFLOXACIN <=0.25 SENSITIVE Sensitive     GENTAMICIN <=1 SENSITIVE Sensitive     IMIPENEM <=0.25 SENSITIVE Sensitive     NITROFURANTOIN <=16 SENSITIVE Sensitive     TRIMETH/SULFA <=20 SENSITIVE Sensitive     AMPICILLIN/SULBACTAM 16 INTERMEDIATE Intermediate     PIP/TAZO <=4 SENSITIVE Sensitive     * >=100,000 COLONIES/mL ESCHERICHIA COLI  SARS Coronavirus 2 by RT PCR (hospital order, performed in [place] hospital lab) *cepheid single result test* Anterior Nasal Swab     Status: None   Collection Time: 01/27/22  4:57 PM   Specimen: Anterior Nasal Swab  Result Value Ref Range Status   SARS Coronavirus 2 by RT PCR NEGATIVE NEGATIVE Final    Comment: (NOTE) SARS-CoV-2 target nucleic acids are NOT DETECTED.  The SARS-CoV-2 RNA is generally detectable in upper and lower respiratory specimens during the acute phase of infection. The lowest concentration of SARS-CoV-2  viral copies this assay can detect is 250 copies / mL. A negative result does not preclude SARS-CoV-2 infection and should not be used as the sole basis for treatment or other patient management decisions.  A negative result may occur with improper specimen collection / handling, submission of specimen other than nasopharyngeal swab, presence of viral mutation(s) within the areas targeted by this assay, and inadequate number of viral copies (<250 copies / mL). A negative result must be combined with clinical observations, patient history, and epidemiological information.  Fact Sheet for Patients:   https://www.patel.info/  Fact Sheet for Healthcare Providers: https://hall.com/  This test is not yet approved or  cleared by the [place] FDA and has been authorized for detection and/or diagnosis of SARS-CoV-2 by FDA under an Emergency Use Authorization (EUA).  This EUA will remain in effect (meaning this test can be used) for the duration of the COVID-19 declaration under Section 564(b)(1) of the Act, 21 U.S.C. section 360bbb-3(b)(1), unless the authorization is terminated or revoked sooner.  Performed at [facility], [place] [address], [place], [place] [address]   Respiratory (~20 pathogens) panel by PCR     Status: None   Collection Time: 01/27/22  4:57 PM   Specimen: Nasopharyngeal Swab; Respiratory  Result Value Ref Range Status   Adenovirus NOT DETECTED NOT DETECTED Final   Coronavirus 229E NOT DETECTED NOT DETECTED Final    Comment: (NOTE) The Coronavirus on the Respiratory Panel, DOES NOT test for the novel  Coronavirus (2019 nCoV)    Coronavirus HKU1 NOT DETECTED NOT DETECTED Final   Coronavirus NL63 NOT DETECTED NOT DETECTED Final   Coronavirus OC43 NOT DETECTED NOT DETECTED Final   Metapneumovirus NOT DETECTED NOT DETECTED Final   Rhinovirus / Enterovirus NOT DETECTED NOT DETECTED Final   Influenza A NOT  DETECTED NOT DETECTED Final   Influenza B NOT DETECTED NOT DETECTED Final   Parainfluenza Virus 1 NOT DETECTED NOT DETECTED Final   Parainfluenza Virus 2 NOT DETECTED NOT DETECTED Final   Parainfluenza Virus 3 NOT DETECTED NOT DETECTED Final   Parainfluenza Virus 4 NOT DETECTED NOT DETECTED Final   Respiratory Syncytial Virus  NOT DETECTED NOT DETECTED Final   Bordetella pertussis NOT DETECTED NOT DETECTED Final   Bordetella Parapertussis NOT DETECTED NOT DETECTED Final   Chlamydophila pneumoniae NOT DETECTED NOT DETECTED Final   Mycoplasma pneumoniae NOT DETECTED NOT DETECTED Final    Comment: Performed at [facility], [place] [address] [place], [place] [address]     Time coordinating discharge: 25 minutes  SIGNED: Antonieta Pert, MD  [facility] 01/31/2022, 10:42 AM  If 7PM-7AM, please contact night-coverage [email or web address]

## 2022-02-07 ENCOUNTER — Other Ambulatory Visit: Payer: Self-pay

## 2022-02-07 ENCOUNTER — Emergency Department (HOSPITAL_BASED_OUTPATIENT_CLINIC_OR_DEPARTMENT_OTHER): Payer: Medicare Other

## 2022-02-07 ENCOUNTER — Inpatient Hospital Stay (HOSPITAL_BASED_OUTPATIENT_CLINIC_OR_DEPARTMENT_OTHER)
Admission: EM | Admit: 2022-02-07 | Discharge: 2022-02-15 | DRG: 872 | Disposition: A | Discharge status: Home / Self Care | Payer: Medicare Other | Attending: Internal Medicine | Admitting: Internal Medicine

## 2022-02-07 DIAGNOSIS — Z79899 Other long term (current) drug therapy: Secondary | ICD-10-CM

## 2022-02-07 DIAGNOSIS — F32A Depression, unspecified: Secondary | ICD-10-CM | POA: Diagnosis present

## 2022-02-07 DIAGNOSIS — N1831 Chronic kidney disease, stage 3a: Secondary | ICD-10-CM | POA: Diagnosis present

## 2022-02-07 DIAGNOSIS — E039 Hypothyroidism, unspecified: Secondary | ICD-10-CM | POA: Diagnosis present

## 2022-02-07 DIAGNOSIS — J449 Chronic obstructive pulmonary disease, unspecified: Secondary | ICD-10-CM | POA: Diagnosis present

## 2022-02-07 DIAGNOSIS — K068 Other specified disorders of gingiva and edentulous alveolar ridge: Secondary | ICD-10-CM | POA: Diagnosis not present

## 2022-02-07 DIAGNOSIS — N10 Acute tubulo-interstitial nephritis: Secondary | ICD-10-CM | POA: Diagnosis present

## 2022-02-07 DIAGNOSIS — Z87891 Personal history of nicotine dependence: Secondary | ICD-10-CM

## 2022-02-07 DIAGNOSIS — K219 Gastro-esophageal reflux disease without esophagitis: Secondary | ICD-10-CM | POA: Diagnosis present

## 2022-02-07 DIAGNOSIS — N12 Tubulo-interstitial nephritis, not specified as acute or chronic: Principal | ICD-10-CM

## 2022-02-07 DIAGNOSIS — Z888 Allergy status to other drugs, medicaments and biological substances status: Secondary | ICD-10-CM

## 2022-02-07 DIAGNOSIS — Z1611 Resistance to penicillins: Secondary | ICD-10-CM | POA: Diagnosis present

## 2022-02-07 DIAGNOSIS — Z96643 Presence of artificial hip joint, bilateral: Secondary | ICD-10-CM | POA: Diagnosis present

## 2022-02-07 DIAGNOSIS — Z882 Allergy status to sulfonamides status: Secondary | ICD-10-CM

## 2022-02-07 DIAGNOSIS — A4151 Sepsis due to Escherichia coli [E. coli]: Principal | ICD-10-CM | POA: Diagnosis present

## 2022-02-07 DIAGNOSIS — D649 Anemia, unspecified: Secondary | ICD-10-CM | POA: Diagnosis present

## 2022-02-07 DIAGNOSIS — I10 Essential (primary) hypertension: Secondary | ICD-10-CM

## 2022-02-07 DIAGNOSIS — Z20822 Contact with and (suspected) exposure to covid-19: Secondary | ICD-10-CM | POA: Diagnosis present

## 2022-02-07 DIAGNOSIS — I129 Hypertensive chronic kidney disease with stage 1 through stage 4 chronic kidney disease, or unspecified chronic kidney disease: Secondary | ICD-10-CM | POA: Diagnosis present

## 2022-02-07 DIAGNOSIS — E213 Hyperparathyroidism, unspecified: Secondary | ICD-10-CM | POA: Diagnosis present

## 2022-02-07 DIAGNOSIS — Z79890 Hormone replacement therapy (postmenopausal): Secondary | ICD-10-CM

## 2022-02-07 DIAGNOSIS — F419 Anxiety disorder, unspecified: Secondary | ICD-10-CM | POA: Diagnosis present

## 2022-02-07 DIAGNOSIS — R911 Solitary pulmonary nodule: Secondary | ICD-10-CM | POA: Diagnosis present

## 2022-02-07 DIAGNOSIS — E785 Hyperlipidemia, unspecified: Secondary | ICD-10-CM | POA: Diagnosis present

## 2022-02-07 DIAGNOSIS — Z85828 Personal history of other malignant neoplasm of skin: Secondary | ICD-10-CM

## 2022-02-07 LAB — URINALYSIS, ROUTINE W REFLEX MICROSCOPIC
Bilirubin Urine: NEGATIVE
Glucose, UA: NEGATIVE mg/dL
Hgb urine dipstick: NEGATIVE
Ketones, ur: NEGATIVE mg/dL
Leukocytes,Ua: NEGATIVE
Nitrite: NEGATIVE
Specific Gravity, Urine: 1.011 (ref 1.005–1.030)
pH: 6.5 (ref 5.0–8.0)

## 2022-02-07 LAB — CBC WITH DIFFERENTIAL/PLATELET
Abs Immature Granulocytes: 0.1 10*3/uL — ABNORMAL HIGH (ref 0.00–0.07)
Basophils Absolute: 0 10*3/uL (ref 0.0–0.1)
Basophils Relative: 0 %
Eosinophils Absolute: 0.1 10*3/uL (ref 0.0–0.5)
Eosinophils Relative: 1 %
HCT: 28.9 % — ABNORMAL LOW (ref 36.0–46.0)
Hemoglobin: 9.2 g/dL — ABNORMAL LOW (ref 12.0–15.0)
Immature Granulocytes: 1 %
Lymphocytes Relative: 6 %
Lymphs Abs: 0.7 10*3/uL (ref 0.7–4.0)
MCH: 30 pg (ref 26.0–34.0)
MCHC: 31.8 g/dL (ref 30.0–36.0)
MCV: 94.1 fL (ref 80.0–100.0)
Monocytes Absolute: 1.6 10*3/uL — ABNORMAL HIGH (ref 0.1–1.0)
Monocytes Relative: 12 %
Neutro Abs: 10.7 10*3/uL — ABNORMAL HIGH (ref 1.7–7.7)
Neutrophils Relative %: 80 %
Platelets: 470 10*3/uL — ABNORMAL HIGH (ref 150–400)
RBC: 3.07 MIL/uL — ABNORMAL LOW (ref 3.87–5.11)
RDW: 15.6 % — ABNORMAL HIGH (ref 11.5–15.5)
WBC: 13.2 10*3/uL — ABNORMAL HIGH (ref 4.0–10.5)
nRBC: 0 % (ref 0.0–0.2)

## 2022-02-07 LAB — COMPREHENSIVE METABOLIC PANEL
ALT: 7 U/L (ref 0–44)
AST: 8 U/L — ABNORMAL LOW (ref 15–41)
Albumin: 3.6 g/dL (ref 3.5–5.0)
Alkaline Phosphatase: 98 U/L (ref 38–126)
Anion gap: 11 (ref 5–15)
BUN: 10 mg/dL (ref 8–23)
CO2: 24 mmol/L (ref 22–32)
Calcium: 10.1 mg/dL (ref 8.9–10.3)
Chloride: 100 mmol/L (ref 98–111)
Creatinine, Ser: 1.18 mg/dL — ABNORMAL HIGH (ref 0.44–1.00)
GFR, Estimated: 45 mL/min — ABNORMAL LOW (ref >60)
Glucose, Bld: 102 mg/dL — ABNORMAL HIGH (ref 70–99)
Potassium: 3.7 mmol/L (ref 3.5–5.1)
Sodium: 135 mmol/L (ref 135–145)
Total Bilirubin: 0.6 mg/dL (ref 0.3–1.2)
Total Protein: 7 g/dL (ref 6.5–8.1)

## 2022-02-07 LAB — LACTIC ACID, PLASMA
Lactic Acid, Venous: 0.9 mmol/L (ref 0.5–1.9)
Lactic Acid, Venous: 0.9 mmol/L (ref 0.5–1.9)

## 2022-02-07 LAB — RESP PANEL BY RT-PCR (FLU A&B, COVID) ARPGX2
Influenza A by PCR: NEGATIVE
Influenza B by PCR: NEGATIVE
SARS Coronavirus 2 by RT PCR: NEGATIVE

## 2022-02-07 MED ORDER — IOHEXOL 300 MG/ML  SOLN
100.0000 mL | Freq: Once | INTRAMUSCULAR | Status: AC | PRN
  Administered 2022-02-07: 80 mL via INTRAVENOUS

## 2022-02-07 MED ORDER — SODIUM CHLORIDE 0.9 % IV BOLUS
1000.0000 mL | Freq: Once | INTRAVENOUS | Status: AC
Start: 2022-02-07 — End: 2022-02-07
  Administered 2022-02-07: 1000 mL via INTRAVENOUS

## 2022-02-07 MED ORDER — ACETAMINOPHEN 325 MG PO TABS
650.0000 mg | ORAL_TABLET | Freq: Once | ORAL | Status: AC
  Administered 2022-02-07: 650 mg via ORAL
  Filled 2022-02-07: qty 2

## 2022-02-07 NOTE — ED Provider Notes (Signed)
[facility] EMERGENCY DEPT Provider Note   CSN: [id number] Arrival date & time: 02/07/22  1748     History {Add pertinent medical, surgical, social history, OB history to HPI:1} Chief Complaint  Patient presents with   Fever    Janet Mccarthy is a 87 y.o. female.   Fever Patient presents with fever.  Discharged a week ago after fever with sepsis and UTI.  Had been on cephalosporin at home.  Still been on it but began to have fevers again.  Occasional cough.  Has been doing rather well otherwise been feeling more fatigued.  Called PCP and was told to stop the Duricef and start Cipro.  Has had 1 dose.  Came in because continues to feel bad.  Not having dysuria but states she did not have it with the admission to the hospital either.  Still occasional cough with no real sputum production.  No nausea or vomiting.  No abdominal pain.  No flank pain.  Reviewed CT scan from recent admission and reviewed discharge summary.  Had potentially pyelonephritis on the CT scan.     Home Medications Prior to Admission medications   Medication Sig Start Date End Date Taking? Authorizing Provider  acetaminophen (TYLENOL) 500 MG tablet Take 500 mg by mouth every 6 (six) hours as needed for fever or moderate pain.    [provider]  amitriptyline (ELAVIL) 50 MG tablet Take 50 mg by mouth daily. 02/23/17   [provider]  cefadroxil (DURICEF) 500 MG capsule Take 1 capsule (500 mg total) by mouth 2 (two) times daily for 10 days. 01/31/22 02/10/22  Antonieta Pert, MD  clobetasol cream (TEMOVATE) 0.10 % Apply 1 application topically daily as needed (irritation). 09/23/14   [provider]  COVID-19 mRNA bivalent vaccine, Pfizer, (PFIZER COVID-19 VAC BIVALENT) injection Inject into the muscle. 05/15/21   Carlyle Basques, MD  levothyroxine (SYNTHROID) 75 MCG tablet Take 75 mcg by mouth daily before breakfast. 02/20/21   [provider]  pantoprazole (PROTONIX) 40 MG  tablet Take 1 tablet (40 mg total) by mouth daily. Office visit for further refills Patient taking differently: Take 40 mg by mouth daily. 11/19/21   Vladimir Crofts, PA-C  saccharomyces boulardii (FLORASTOR) 250 MG capsule Take 1 capsule (250 mg total) by mouth 2 (two) times daily for 14 days. 01/31/22 02/14/22  Antonieta Pert, MD  sodium chloride (OCEAN) 0.65 % SOLN nasal spray Place 1 spray into both nostrils daily as needed for congestion.    [provider]  traMADol (ULTRAM) 50 MG tablet Take 1-2 tablets (50-100 mg total) by mouth every 6 (six) hours as needed for moderate pain. Patient not taking: Reported on 01/27/2022 06/02/21   Derl Barrow, PA      Allergies    Omeprazole, Rabeprazole, Sulfa antibiotics, and Umeclidinium-vilanterol    Review of Systems   Review of Systems  Constitutional:  Positive for fever.    Physical Exam Updated Vital Signs BP (!) 157/74   Pulse (!) 103   Temp (!) 100.6 F (38.1 C) (Oral)   Resp 14   Ht '5\' 6"'$  (1.676 m)   Wt 73.5 kg   SpO2 97%   BMI 26.15 kg/m  Physical Exam Vitals reviewed.  Eyes:     Pupils: Pupils are equal, round, and reactive to light.  Cardiovascular:     Rate and Rhythm: Tachycardia present.  Pulmonary:     Breath sounds: No wheezing or rhonchi.     Comments: Occasional  cough. Abdominal:     Tenderness: There is no abdominal tenderness.  Genitourinary:    Comments: No CVA tenderness Musculoskeletal:        General: No tenderness.     Cervical back: Neck supple.  Skin:    Capillary Refill: Capillary refill takes less than 2 seconds.  Neurological:     Mental Status: She is alert. Mental status is at baseline.     ED Results / Procedures / Treatments   Labs (all labs ordered are listed, but only abnormal results are displayed) Labs Reviewed  COMPREHENSIVE METABOLIC PANEL - Abnormal; Notable for the following components:      Result Value   Glucose, Bld 102 (*)    Creatinine, Ser 1.18 (*)    AST 8  (*)    GFR, Estimated 45 (*)    All other components within normal limits  CBC WITH DIFFERENTIAL/PLATELET - Abnormal; Notable for the following components:   WBC 13.2 (*)    RBC 3.07 (*)    Hemoglobin 9.2 (*)    HCT 28.9 (*)    RDW 15.6 (*)    Platelets 470 (*)    Neutro Abs 10.7 (*)    Monocytes Absolute 1.6 (*)    Abs Immature Granulocytes 0.10 (*)    All other components within normal limits  RESP PANEL BY RT-PCR (FLU A&B, COVID) ARPGX2  CULTURE, BLOOD (ROUTINE X 2)  CULTURE, BLOOD (ROUTINE X 2)  LACTIC ACID, PLASMA  URINALYSIS, ROUTINE W REFLEX MICROSCOPIC  LACTIC ACID, PLASMA    EKG None  Radiology DG Chest Portable 1 View  Result Date: 02/07/2022 CLINICAL DATA:  Fever. EXAM: PORTABLE CHEST 1 VIEW COMPARISON:  Chest x-ray 01/27/2022 FINDINGS: The heart size and mediastinal contours are within normal limits. There is blunting of the left costophrenic angle. There are minimal patchy opacities in the left lung base. No pneumothorax. Degenerative changes affect the left shoulder. IMPRESSION: 1. Small left pleural effusion with minimal left lower lobe atelectasis/airspace disease. Electronically Signed   By: Ronney Asters M.D.   On: 02/07/2022 19:18    Procedures Procedures  {Document cardiac monitor, telemetry assessment procedure when appropriate:1}  Medications Ordered in ED Medications  sodium chloride 0.9 % bolus 1,000 mL (1,000 mLs Intravenous New Bag/Given 02/07/22 1853)  acetaminophen (TYLENOL) tablet 650 mg (650 mg Oral Given 02/07/22 1948)    ED Course/ Medical Decision Making/ A&P                           Medical Decision Making Amount and/or Complexity of Data Reviewed Labs: ordered. Radiology: ordered.  Risk OTC drugs.   Patient presents with return of fever.  Recent admission to the hospital sepsis for UTI.  Also had cough at that time.  Continues to have a little bit of a cough.  No urinary symptoms now but was not having urinary symptoms with admission.   Will check blood work.  White count mildly elevated at 13 and creatinine 1.18.  White count is up since discharge but creatinine has decreased.  {Document critical care time when appropriate:1} {Document review of labs and clinical decision tools ie heart score, Chads2Vasc2 etc:1}  {Document your independent review of radiology images, and any outside records:1} {Document your discussion with family members, caretakers, and with consultants:1} {Document social determinants of health affecting pt's care:1} {Document your decision making why or why not admission, treatments were needed:1} Final Clinical Impression(s) / ED Diagnoses Final diagnoses:  None    Rx / DC Orders ED Discharge Orders     None

## 2022-02-07 NOTE — ED Provider Notes (Incomplete)
[facility] EMERGENCY DEPT Provider Note   CSN: [id number] Arrival date & time: 02/07/22  1748     History {Add pertinent medical, surgical, social history, OB history to HPI:1} Chief Complaint  Patient presents with  . Fever    Janet Mccarthy is a 87 y.o. female.   Fever Patient presents with fever.  Discharged a week ago after fever with sepsis and UTI.  Had been on cephalosporin at home.  Still been on it but began to have fevers again.  Occasional cough.  Has been doing rather well otherwise been feeling more fatigued.  Called PCP and was told to stop the Duricef and start Cipro.  Has had 1 dose.  Came in because continues to feel bad.  Not having dysuria but states she did not have it with the admission to the hospital either.  Still occasional cough with no real sputum production.  No nausea or vomiting.  No abdominal pain.  No flank pain.  Reviewed CT scan from recent admission and reviewed discharge summary.  Had potentially pyelonephritis on the CT scan.     Home Medications Prior to Admission medications   Medication Sig Start Date End Date Taking? Authorizing Provider  acetaminophen (TYLENOL) 500 MG tablet Take 500 mg by mouth every 6 (six) hours as needed for fever or moderate pain.    [provider]  amitriptyline (ELAVIL) 50 MG tablet Take 50 mg by mouth daily. 02/23/17   [provider]  cefadroxil (DURICEF) 500 MG capsule Take 1 capsule (500 mg total) by mouth 2 (two) times daily for 10 days. 01/31/22 02/10/22  Antonieta Pert, MD  clobetasol cream (TEMOVATE) 4.48 % Apply 1 application topically daily as needed (irritation). 09/23/14   [provider]  COVID-19 mRNA bivalent vaccine, Pfizer, (PFIZER COVID-19 VAC BIVALENT) injection Inject into the muscle. 05/15/21   Carlyle Basques, MD  levothyroxine (SYNTHROID) 75 MCG tablet Take 75 mcg by mouth daily before breakfast. 02/20/21   [provider]  pantoprazole (PROTONIX) 40  MG tablet Take 1 tablet (40 mg total) by mouth daily. Office visit for further refills Patient taking differently: Take 40 mg by mouth daily. 11/19/21   Vladimir Crofts, PA-C  saccharomyces boulardii (FLORASTOR) 250 MG capsule Take 1 capsule (250 mg total) by mouth 2 (two) times daily for 14 days. 01/31/22 02/14/22  Antonieta Pert, MD  sodium chloride (OCEAN) 0.65 % SOLN nasal spray Place 1 spray into both nostrils daily as needed for congestion.    [provider]  traMADol (ULTRAM) 50 MG tablet Take 1-2 tablets (50-100 mg total) by mouth every 6 (six) hours as needed for moderate pain. Patient not taking: Reported on 01/27/2022 06/02/21   Derl Barrow, PA      Allergies    Omeprazole, Rabeprazole, Sulfa antibiotics, and Umeclidinium-vilanterol    Review of Systems   Review of Systems  Constitutional:  Positive for fever.    Physical Exam Updated Vital Signs BP (!) 144/65   Pulse 95   Temp (!) 100.6 F (38.1 C) (Oral)   Resp (!) 23   Ht '5\' 6"'$  (1.676 m)   Wt 73.5 kg   SpO2 94%   BMI 26.15 kg/m  Physical Exam Vitals reviewed.  Eyes:     Pupils: Pupils are equal, round, and reactive to light.  Cardiovascular:     Rate and Rhythm: Tachycardia present.  Pulmonary:     Breath sounds: No wheezing or rhonchi.     Comments: Occasional  cough. Abdominal:     Tenderness: There is no abdominal tenderness.  Genitourinary:    Comments: No CVA tenderness Musculoskeletal:        General: No tenderness.     Cervical back: Neck supple.  Skin:    Capillary Refill: Capillary refill takes less than 2 seconds.  Neurological:     Mental Status: She is alert. Mental status is at baseline.     ED Results / Procedures / Treatments   Labs (all labs ordered are listed, but only abnormal results are displayed) Labs Reviewed  URINALYSIS, ROUTINE W REFLEX MICROSCOPIC - Abnormal; Notable for the following components:      Result Value   Protein, ur TRACE (*)    All other components  within normal limits  COMPREHENSIVE METABOLIC PANEL - Abnormal; Notable for the following components:   Glucose, Bld 102 (*)    Creatinine, Ser 1.18 (*)    AST 8 (*)    GFR, Estimated 45 (*)    All other components within normal limits  CBC WITH DIFFERENTIAL/PLATELET - Abnormal; Notable for the following components:   WBC 13.2 (*)    RBC 3.07 (*)    Hemoglobin 9.2 (*)    HCT 28.9 (*)    RDW 15.6 (*)    Platelets 470 (*)    Neutro Abs 10.7 (*)    Monocytes Absolute 1.6 (*)    Abs Immature Granulocytes 0.10 (*)    All other components within normal limits  RESP PANEL BY RT-PCR (FLU A&B, COVID) ARPGX2  CULTURE, BLOOD (ROUTINE X 2)  CULTURE, BLOOD (ROUTINE X 2)  LACTIC ACID, PLASMA  LACTIC ACID, PLASMA    EKG None  Radiology CT CHEST ABDOMEN PELVIS W CONTRAST  Result Date: 02/07/2022 CLINICAL DATA:  Sepsis, fever, cough, recent UTI. EXAM: CT CHEST, ABDOMEN, AND PELVIS WITH CONTRAST TECHNIQUE: Multidetector CT imaging of the chest, abdomen and pelvis was performed following the standard protocol during bolus administration of intravenous contrast. RADIATION DOSE REDUCTION: This exam was performed according to the departmental dose-optimization program which includes automated exposure control, adjustment of the mA and/or kV according to patient size and/or use of iterative reconstruction technique. CONTRAST:  72m OMNIPAQUE IOHEXOL 300 MG/ML  SOLN COMPARISON:  01/28/2022. FINDINGS: CT CHEST FINDINGS Cardiovascular: The heart is normal in size and there is a trace pericardial effusion. Scattered coronary artery calcifications are noted. There is atherosclerotic calcification of the aorta without evidence of aneurysm. The pulmonary trunk is normal in caliber. Mediastinum/Nodes: No mediastinal, hilar, or axillary lymphadenopathy. A few nodules are present in the thyroid gland bilaterally, the largest measuring 1 cm. The trachea and esophagus are within normal limits. There is a moderate hiatal  hernia. Lungs/Pleura: There is atelectasis at the lung bases with small bilateral pleural effusions. No pneumothorax. A 4 mm nodule is present in the left upper lobe, axial image 90. Musculoskeletal: Degenerative changes are present in the thoracic spine. No acute osseous abnormality. CT ABDOMEN PELVIS FINDINGS Hepatobiliary: No focal liver abnormality is seen. Multiple stones are present within the gallbladder. No biliary ductal dilatation. Pancreas: There is pancreatic atrophy at the head. No pancreatic ductal dilatation or surrounding inflammatory changes. Spleen: Normal in size without focal abnormality. Adrenals/Urinary Tract: The adrenal glands are within normal limits. Cysts are noted in the kidneys bilaterally. Additional subcentimeter hypodensities are noted bilaterally which are too small to further characterize. Patchy areas of hypoenhancement are present in the kidneys bilaterally and best seen on delayed imaging. No renal calculus or  hydronephrosis. There is mild fat stranding at the renal pelvis on the right. The bladder is not well seen due to metallic hardware artifact. Stomach/Bowel: Moderate hiatal hernia. Stomach is otherwise within normal limits. Appendix is not seen. No evidence of bowel wall thickening, distention, or inflammatory changes. No free air or pneumatosis. Vascular/Lymphatic: Aortic atherosclerosis. No enlarged abdominal or pelvic lymph nodes. Reproductive: Coarse calcifications are present in the uterus, likely degenerating fibroids. No adnexal mass. Other: No abdominopelvic ascites. Musculoskeletal: Degenerative changes are present in the lumbar spine. Bilateral hip arthroplasty changes are noted. No acute osseous abnormality. IMPRESSION: 1. Patchy hypoenhancement of the kidneys bilaterally with mild fat stranding at the right renal pelvis, compatible with pyelonephritis. No renal calculus or hydronephrosis. 2. Bilateral renal cysts. 3. 4 mm left upper lobe nodule. No follow-up  needed if patient is low-risk.This recommendation follows the consensus statement: Guidelines for Management of Incidental Pulmonary Nodules Detected on CT Images: From the Fleischner Society 2017; Radiology 2017; 284:228-243. 4. Cholelithiasis. 5. Moderate hiatal hernia. 6. Aortic atherosclerosis and coronary artery calcifications. 7. Remaining incidental findings as described above. Electronically Signed   By: Brett Fairy M.D.   On: 02/07/2022 23:36   DG Chest Portable 1 View  Result Date: 02/07/2022 CLINICAL DATA:  Fever. EXAM: PORTABLE CHEST 1 VIEW COMPARISON:  Chest x-ray 01/27/2022 FINDINGS: The heart size and mediastinal contours are within normal limits. There is blunting of the left costophrenic angle. There are minimal patchy opacities in the left lung base. No pneumothorax. Degenerative changes affect the left shoulder. IMPRESSION: 1. Small left pleural effusion with minimal left lower lobe atelectasis/airspace disease. Electronically Signed   By: Ronney Asters M.D.   On: 02/07/2022 19:18    Procedures Procedures  {Document cardiac monitor, telemetry assessment procedure when appropriate:1}  Medications Ordered in ED Medications  sodium chloride 0.9 % bolus 1,000 mL (0 mLs Intravenous Stopped 02/07/22 2016)  acetaminophen (TYLENOL) tablet 650 mg (650 mg Oral Given 02/07/22 1948)  iohexol (OMNIPAQUE) 300 MG/ML solution 100 mL (80 mLs Intravenous Contrast Given 02/07/22 2306)    ED Course/ Medical Decision Making/ A&P                           Medical Decision Making Amount and/or Complexity of Data Reviewed Labs: ordered. Radiology: ordered.  Risk OTC drugs. Prescription drug management. Decision regarding hospitalization.   Patient presents with return of fever.  Recent admission to the hospital sepsis for UTI.  Also had cough at that time.  Continues to have a little bit of a cough.  No urinary symptoms now but was not having urinary symptoms with admission.  Will check blood  work.  White count mildly elevated at 13 and creatinine 1.18.  White count is up since discharge but creatinine has decreased.  Urine does notshow infection.  Cultures been sent.  Done since no clear source of fever was found and patient has had recent UTI.  CT scan showed pyelonephritis bilaterally potentially worse on the right.  With this I think she will benefit from mission to the hospital.  Lactic acid was reassuring.  Will discuss with hospitalist for admission.  {Document critical care time when appropriate:1} {Document review of labs and clinical decision tools ie heart score, Chads2Vasc2 etc:1}  {Document your independent review of radiology images, and any outside records:1} {Document your discussion with family members, caretakers, and with consultants:1} {Document social determinants of health affecting pt's care:1} {Document your decision making why  or why not admission, treatments were needed:1} Final Clinical Impression(s) / ED Diagnoses Final diagnoses:  Pyelonephritis    Rx / DC Orders ED Discharge Orders     None

## 2022-02-08 ENCOUNTER — Encounter (HOSPITAL_BASED_OUTPATIENT_CLINIC_OR_DEPARTMENT_OTHER): Payer: Self-pay | Admitting: Internal Medicine

## 2022-02-08 DIAGNOSIS — D649 Anemia, unspecified: Secondary | ICD-10-CM

## 2022-02-08 DIAGNOSIS — J449 Chronic obstructive pulmonary disease, unspecified: Secondary | ICD-10-CM | POA: Diagnosis present

## 2022-02-08 DIAGNOSIS — F32A Depression, unspecified: Secondary | ICD-10-CM | POA: Diagnosis present

## 2022-02-08 DIAGNOSIS — R911 Solitary pulmonary nodule: Secondary | ICD-10-CM | POA: Diagnosis present

## 2022-02-08 DIAGNOSIS — I10 Essential (primary) hypertension: Secondary | ICD-10-CM

## 2022-02-08 DIAGNOSIS — E039 Hypothyroidism, unspecified: Secondary | ICD-10-CM | POA: Diagnosis present

## 2022-02-08 DIAGNOSIS — K219 Gastro-esophageal reflux disease without esophagitis: Secondary | ICD-10-CM

## 2022-02-08 DIAGNOSIS — N12 Tubulo-interstitial nephritis, not specified as acute or chronic: Secondary | ICD-10-CM | POA: Diagnosis not present

## 2022-02-08 DIAGNOSIS — N10 Acute tubulo-interstitial nephritis: Secondary | ICD-10-CM | POA: Diagnosis present

## 2022-02-08 DIAGNOSIS — Z20822 Contact with and (suspected) exposure to covid-19: Secondary | ICD-10-CM | POA: Diagnosis present

## 2022-02-08 DIAGNOSIS — Z87891 Personal history of nicotine dependence: Secondary | ICD-10-CM | POA: Diagnosis not present

## 2022-02-08 DIAGNOSIS — Z888 Allergy status to other drugs, medicaments and biological substances status: Secondary | ICD-10-CM | POA: Diagnosis not present

## 2022-02-08 DIAGNOSIS — Z96643 Presence of artificial hip joint, bilateral: Secondary | ICD-10-CM | POA: Diagnosis present

## 2022-02-08 DIAGNOSIS — K068 Other specified disorders of gingiva and edentulous alveolar ridge: Secondary | ICD-10-CM | POA: Diagnosis not present

## 2022-02-08 DIAGNOSIS — Z79899 Other long term (current) drug therapy: Secondary | ICD-10-CM | POA: Diagnosis not present

## 2022-02-08 DIAGNOSIS — Z79890 Hormone replacement therapy (postmenopausal): Secondary | ICD-10-CM | POA: Diagnosis not present

## 2022-02-08 DIAGNOSIS — Z882 Allergy status to sulfonamides status: Secondary | ICD-10-CM | POA: Diagnosis not present

## 2022-02-08 DIAGNOSIS — E213 Hyperparathyroidism, unspecified: Secondary | ICD-10-CM | POA: Diagnosis present

## 2022-02-08 DIAGNOSIS — F419 Anxiety disorder, unspecified: Secondary | ICD-10-CM | POA: Diagnosis present

## 2022-02-08 DIAGNOSIS — I129 Hypertensive chronic kidney disease with stage 1 through stage 4 chronic kidney disease, or unspecified chronic kidney disease: Secondary | ICD-10-CM | POA: Diagnosis present

## 2022-02-08 DIAGNOSIS — E785 Hyperlipidemia, unspecified: Secondary | ICD-10-CM | POA: Diagnosis present

## 2022-02-08 DIAGNOSIS — N1831 Chronic kidney disease, stage 3a: Secondary | ICD-10-CM | POA: Diagnosis present

## 2022-02-08 DIAGNOSIS — A4151 Sepsis due to Escherichia coli [E. coli]: Secondary | ICD-10-CM | POA: Diagnosis present

## 2022-02-08 DIAGNOSIS — Z85828 Personal history of other malignant neoplasm of skin: Secondary | ICD-10-CM | POA: Diagnosis not present

## 2022-02-08 DIAGNOSIS — Z1611 Resistance to penicillins: Secondary | ICD-10-CM | POA: Diagnosis present

## 2022-02-08 MED ORDER — HYDRALAZINE HCL 20 MG/ML IJ SOLN
10.0000 mg | Freq: Three times a day (TID) | INTRAMUSCULAR | Status: DC | PRN
  Filled 2022-02-08: qty 1

## 2022-02-08 MED ORDER — SODIUM CHLORIDE 0.9 % IV SOLN
1.0000 g | Freq: Once | INTRAVENOUS | Status: AC
  Administered 2022-02-08: 1 g via INTRAVENOUS
  Filled 2022-02-08: qty 10

## 2022-02-08 MED ORDER — ACETAMINOPHEN 325 MG PO TABS
650.0000 mg | ORAL_TABLET | Freq: Four times a day (QID) | ORAL | Status: DC | PRN
  Administered 2022-02-09 – 2022-02-10 (×3): 650 mg via ORAL
  Filled 2022-02-08 (×3): qty 2

## 2022-02-08 MED ORDER — PANTOPRAZOLE SODIUM 40 MG PO TBEC
40.0000 mg | DELAYED_RELEASE_TABLET | Freq: Every morning | ORAL | Status: DC
  Administered 2022-02-09 – 2022-02-14 (×6): 40 mg via ORAL
  Filled 2022-02-08 (×6): qty 1

## 2022-02-08 MED ORDER — ACETAMINOPHEN 325 MG PO TABS
650.0000 mg | ORAL_TABLET | Freq: Once | ORAL | Status: AC
Start: 2022-02-08 — End: 2022-02-08
  Administered 2022-02-08: 650 mg via ORAL
  Filled 2022-02-08: qty 2

## 2022-02-08 MED ORDER — LEVOTHYROXINE SODIUM 50 MCG PO TABS
75.0000 ug | ORAL_TABLET | Freq: Every day | ORAL | Status: DC
  Administered 2022-02-09 – 2022-02-15 (×7): 75 ug via ORAL
  Filled 2022-02-08 (×7): qty 1

## 2022-02-08 MED ORDER — ONDANSETRON HCL 4 MG/2ML IJ SOLN
4.0000 mg | Freq: Three times a day (TID) | INTRAMUSCULAR | Status: DC | PRN
  Administered 2022-02-08 (×2): 4 mg via INTRAVENOUS
  Filled 2022-02-08 (×2): qty 2

## 2022-02-08 MED ORDER — ACETAMINOPHEN 650 MG RE SUPP: 650.0000 mg | Freq: Four times a day (QID) | RECTAL | Status: DC | PRN

## 2022-02-08 MED ORDER — LACTATED RINGERS IV SOLN: INTRAVENOUS | Status: DC

## 2022-02-08 MED ORDER — SODIUM CHLORIDE 0.9 % IV SOLN
2.0000 g | INTRAVENOUS | Status: DC
  Administered 2022-02-09 – 2022-02-14 (×6): 2 g via INTRAVENOUS
  Filled 2022-02-08 (×6): qty 20

## 2022-02-08 MED ORDER — AMITRIPTYLINE HCL 25 MG PO TABS
50.0000 mg | ORAL_TABLET | Freq: Every day | ORAL | Status: DC
  Administered 2022-02-08 – 2022-02-14 (×7): 50 mg via ORAL
  Filled 2022-02-08 (×7): qty 2

## 2022-02-08 MED ORDER — ACETAMINOPHEN 325 MG PO TABS
650.0000 mg | ORAL_TABLET | Freq: Four times a day (QID) | ORAL | Status: DC | PRN
  Administered 2022-02-08: 650 mg via ORAL
  Filled 2022-02-08: qty 2

## 2022-02-08 MED ORDER — ENOXAPARIN SODIUM 40 MG/0.4ML IJ SOSY
40.0000 mg | PREFILLED_SYRINGE | INTRAMUSCULAR | Status: DC
  Administered 2022-02-08 – 2022-02-11 (×4): 40 mg via SUBCUTANEOUS
  Filled 2022-02-08 (×4): qty 0.4

## 2022-02-08 MED ORDER — SALINE SPRAY 0.65 % NA SOLN
1.0000 | Freq: Every day | NASAL | Status: DC
  Administered 2022-02-13: 1 via NASAL
  Filled 2022-02-08: qty 44

## 2022-02-08 NOTE — Plan of Care (Signed)
  Problem: Education: Goal: Knowledge of General Education information will improve Description: Including pain rating scale, medication(s)/side effects and non-pharmacologic comfort measures Outcome: Progressing   Problem: Clinical Measurements: Goal: Ability to maintain clinical measurements within normal limits will improve Outcome: Progressing   Problem: Safety: Goal: Ability to remain free from injury will improve Outcome: Progressing   

## 2022-02-08 NOTE — ED Notes (Signed)
Changed pt's gown and linens. Pt ambulated to the bathroom with cane.

## 2022-02-08 NOTE — H&P (Signed)
History and Physical    Patient: Janet Mccarthy DOB: 07/15/1935 DOA: 02/07/2022 DOS: the patient was seen and examined on 02/08/2022 PCP: Crist Infante, MD  Patient coming from: Home  Chief Complaint:  Chief Complaint  Patient presents with   Fever   HPI: Janet Mccarthy is a 87 y.o. female with medical history significant of hypothyroidism, HTN, CKD3a. Presenting with fevers and chills. Recent admission for sepsis secondary to UTI and bacteremia. The causative agent was noted to be e coli. She was treated with rocephin and transitioned to cefadroxil for a 10-day course at discharge on 7/2. She reports that she was compliant with that medication when she started experiencing fevers on 7/6. Her fevers were intermittent. She called her PCP on 7/8 and was transitioned to cipro. However, she was only able to take one dose. She has had N/V, but no diarrhea. Her fevers have been as high as 102. When her symptoms did not improve, her family brought her to the ED for evaluation. She denies any other aggravating or alleviating factors.   Review of Systems: As mentioned in the history of present illness. All other systems reviewed and are negative. Past Medical History:  Diagnosis Date   Anxiety    Arthritis    right hip   COPD (chronic obstructive pulmonary disease) (HCC)    mild   Depression    Diverticular disease    sigmoid   Diverticulitis    Diverticulosis    Gallstones    GERD (gastroesophageal reflux disease)    Hyperlipidemia    Hyperparathyroidism ([place])    Hypothyroidism    IFG (impaired fasting glucose)    Multiple lipomas    Osteoarthritis    Osteopenia    Retroperitoneal mass    Skin cancer    nose   Vitamin D deficiency    Past Surgical History:  Procedure Laterality Date   COLON RESECTION  2010   with tumor removal   COLONOSCOPY WITH ESOPHAGOGASTRODUODENOSCOPY (EGD)  06/2018   TONSILLECTOMY AND ADENOIDECTOMY     TOTAL HIP ARTHROPLASTY  2004    right   TOTAL HIP ARTHROPLASTY Left 06/01/2021   Procedure: TOTAL HIP ARTHROPLASTY ANTERIOR APPROACH;  Surgeon: Gaynelle Arabian, MD;  Location: [facility];  Service: Orthopedics;  Laterality: Left;   Social History:  reports that she quit smoking about 50 years ago. Her smoking use included cigarettes. She has a 30.00 pack-year smoking history. She has never used smokeless tobacco. She reports that she does not drink alcohol and does not use drugs.  Allergies  Allergen Reactions   Omeprazole     Other reaction(s): abd pain   Rabeprazole     Other reaction(s): lip redness and swelling   Sulfa Antibiotics Itching    Other reaction(s): vomiting   Umeclidinium-Vilanterol     Other reaction(s): urine frequency    Family History  Adopted: Yes    Prior to Admission medications   Medication Sig Start Date End Date Taking? Authorizing Provider  acetaminophen (TYLENOL) 500 MG tablet Take 500 mg by mouth every 6 (six) hours as needed for fever or moderate pain.    [provider]  amitriptyline (ELAVIL) 50 MG tablet Take 50 mg by mouth daily. 02/23/17   [provider]  cefadroxil (DURICEF) 500 MG capsule Take 1 capsule (500 mg total) by mouth 2 (two) times daily for 10 days. 01/31/22 02/10/22  Antonieta Pert, MD  clobetasol cream (TEMOVATE) 9.16 % Apply 1 application topically daily as needed (  irritation). 09/23/14   [provider]  COVID-19 mRNA bivalent vaccine, Pfizer, (PFIZER COVID-19 VAC BIVALENT) injection Inject into the muscle. 05/15/21   Carlyle Basques, MD  levothyroxine (SYNTHROID) 75 MCG tablet Take 75 mcg by mouth daily before breakfast. 02/20/21   [provider]  pantoprazole (PROTONIX) 40 MG tablet Take 1 tablet (40 mg total) by mouth daily. Office visit for further refills Patient taking differently: Take 40 mg by mouth daily. 11/19/21   Vladimir Crofts, PA-C  saccharomyces boulardii (FLORASTOR) 250 MG capsule Take 1 capsule (250 mg total) by mouth 2  (two) times daily for 14 days. 01/31/22 02/14/22  Antonieta Pert, MD  sodium chloride (OCEAN) 0.65 % SOLN nasal spray Place 1 spray into both nostrils daily as needed for congestion.    [provider]  traMADol (ULTRAM) 50 MG tablet Take 1-2 tablets (50-100 mg total) by mouth every 6 (six) hours as needed for moderate pain. Patient not taking: Reported on 01/27/2022 06/02/21   Derl Barrow, [place]    Physical Exam: Vitals:   02/08/22 0915 02/08/22 1030 02/08/22 1100 02/08/22 1300  BP:  128/65 (!) 137/56 (!) 141/66  Pulse: 88 87 86 97  Resp: 19 19 (!) 26 20  Temp:    98.9 F (37.2 C)  TempSrc:    Oral  SpO2: 96% 96% 96%   Weight:      Height:       General: 87 y.o. female resting in bed in NAD Eyes: PERRL, normal sclera ENMT: Nares patent w/o discharge, orophaynx clear, dentition normal, ears w/o discharge/lesions/ulcers Neck: Supple, trachea midline Cardiovascular: tachy, +S1, S2, no m/g/r, equal pulses throughout Respiratory: CTABL, no w/r/r, normal WOB GI: BS+, NDNT, no masses noted, no organomegaly noted MSK: No e/c/c Neuro: A&O x 3, no focal deficits Psyc: Appropriate interaction and affect, calm/cooperative  Data Reviewed:  Na+  135 K+  3.7 BUN  10 SCR  1.18 WBC  13.2 Hgb  9.2 Plt  470  CTA chest/ab/pelvis 1. Patchy hypoenhancement of the kidneys bilaterally with mild fat stranding at the right renal pelvis, compatible with pyelonephritis. No renal calculus or hydronephrosis. 2. Bilateral renal cysts. 3. 4 mm left upper lobe nodule. No follow-up needed if patient is low-risk.This recommendation follows the consensus statement: Guidelines for Management of Incidental Pulmonary Nodules Detected on CT Images: From the Fleischner Society 2017; Radiology 2017; 284:228-243. 4. Cholelithiasis. 5. Moderate hiatal hernia. 6. Aortic atherosclerosis and coronary artery calcifications. 7. Remaining incidental findings as described above.  Assessment and  Plan: Sepsis secondary to pyelonephritis     - admitted to inpt, tele     - started on rocephin; increase to 2g q24     - fluids     - follow bld Cx, Ucx     - lactic acid is ok  Normocytic anemia     - stable, no evidence of bleed, follow  CKD 3a     - at baseline, watch nephrotoxins, follow  HTN     - had her BP meds held at discharge last admission     - will have PRN for now  Hypothyroidism     - continue home regimen  GERD     - PPI  Advance Care Planning:   Code Status: FULL  Consults: None  Family Communication: w/ husband at bedside  Severity of Illness: The appropriate patient status for this patient is INPATIENT. Inpatient status is judged to be reasonable and necessary in order to provide  the required intensity of service to ensure the patient's safety. The patient's presenting symptoms, physical exam findings, and initial radiographic and laboratory data in the context of their chronic comorbidities is felt to place them at high risk for further clinical deterioration. Furthermore, it is not anticipated that the patient will be medically stable for discharge from the hospital within 2 midnights of admission.   * I certify that at the point of admission it is my clinical judgment that the patient will require inpatient hospital care spanning beyond 2 midnights from the point of admission due to high intensity of service, high risk for further deterioration and high frequency of surveillance required.*  Author: Jonnie Finner, DO 02/08/2022 2:08 PM  For on call review [email or web address].

## 2022-02-09 DIAGNOSIS — N10 Acute tubulo-interstitial nephritis: Secondary | ICD-10-CM | POA: Diagnosis not present

## 2022-02-09 LAB — URINE CULTURE: Culture: NO GROWTH

## 2022-02-09 LAB — PROCALCITONIN: Procalcitonin: 0.47 ng/mL

## 2022-02-09 LAB — PROTIME-INR
INR: 1.2 (ref 0.8–1.2)
Prothrombin Time: 15.5 seconds — ABNORMAL HIGH (ref 11.4–15.2)

## 2022-02-09 LAB — CORTISOL-AM, BLOOD: Cortisol - AM: 14 ug/dL (ref 6.7–22.6)

## 2022-02-09 LAB — C-REACTIVE PROTEIN: CRP: 17.4 mg/dL — ABNORMAL HIGH (ref <1.0)

## 2022-02-09 LAB — SEDIMENTATION RATE: Sed Rate: 92 mm/hr — ABNORMAL HIGH (ref 0–22)

## 2022-02-09 MED ORDER — ORAL CARE MOUTH RINSE: 15.0000 mL | OROMUCOSAL | Status: DC | PRN

## 2022-02-09 NOTE — TOC Initial Note (Signed)
Transition of Care [facility]) - Initial/Assessment Note    Patient Details  Name: Janet Mccarthy MRN: [id number] Date of Birth: Dec 31, 1934  Transition of Care [facility]) CM/SW Contact:    Leeroy Cha, RN Phone Number: 02/09/2022, 9:44 AM  Clinical Narrative:                  Transition of Care [facility]) Screening Note   Patient Details  Name: Janet Mccarthy Date of Birth: 02-Nov-1934   Transition of Care [facility]) CM/SW Contact:    Leeroy Cha, RN Phone Number: 02/09/2022, 9:44 AM    Transition of Care Department [facility]) has reviewed patient and no TOC needs have been identified at this time. We will continue to monitor patient advancement through interdisciplinary progression rounds. If new patient transition needs arise, please place a TOC consult.    Expected Discharge Plan: Home/Self Care Barriers to Discharge: Continued Medical Work up   Patient Goals and CMS Choice Patient states their goals for this hospitalization and ongoing recovery are:: to go back home CMS Medicare.gov Compare Post Acute Care list provided to:: Patient    Expected Discharge Plan and Services Expected Discharge Plan: Home/Self Care   Discharge Planning Services: CM Consult   Living arrangements for the past 2 months: Single Family Home                                      Prior Living Arrangements/Services Living arrangements for the past 2 months: Single Family Home Lives with:: Spouse   Do you feel safe going back to the place where you live?: Yes            Criminal Activity/Legal Involvement Pertinent to Current Situation/Hospitalization: No - Comment as needed  Activities of Daily Living Home Assistive Devices/Equipment: Cane (specify quad or straight) ADL Screening (condition at time of admission) Patient's cognitive ability adequate to safely complete daily activities?: Yes Is the patient deaf or have difficulty hearing?: No Does the patient have difficulty  seeing, even when wearing glasses/contacts?: No Does the patient have difficulty concentrating, remembering, or making decisions?: No Patient able to express need for assistance with ADLs?: Yes Does the patient have difficulty dressing or bathing?: No Independently performs ADLs?: No Communication: Independent Dressing (OT): Independent Grooming: Independent Feeding: Independent Bathing: Independent Toileting: Needs assistance Is this a change from baseline?: Pre-admission baseline In/Out Bed: Needs assistance Is this a change from baseline?: Pre-admission baseline Walks in Home: Independent Is this a change from baseline?: Pre-admission baseline Does the patient have difficulty walking or climbing stairs?: Yes Weakness of Legs: Both Weakness of Arms/Hands: Both  Permission Sought/Granted                  Emotional Assessment Appearance:: Appears stated age     Orientation: : Oriented to Place, Oriented to Self, Oriented to  Time, Oriented to Situation Alcohol / Substance Use: Not Applicable Psych Involvement: No (comment)  Admission diagnosis:  Pyelonephritis [N12] Acute pyelonephritis [N10] Patient Active Problem List   Diagnosis Date Noted   Acute pyelonephritis 02/08/2022   Pyelonephritis 02/08/2022   Hypothyroidism 02/08/2022   GERD (gastroesophageal reflux disease) 02/08/2022   Stage 3a chronic kidney disease (CKD) ([place]) 02/08/2022   HTN (hypertension) 02/08/2022   Normocytic anemia 02/08/2022   Pyelonephritis of right kidney 01/30/2022   E coli bacteremia 01/28/2022   Hyperkalemia 01/28/2022   Sepsis ([place]) 01/27/2022  Severe sepsis with acute organ dysfunction due to Gram negative bacteria ([place]) 01/27/2022   Hypokalemia 01/27/2022   Acute renal failure superimposed on stage 3a chronic kidney disease ([place]) 01/27/2022   Leucocytosis 01/27/2022   Hyponatremia 01/27/2022   OA (osteoarthritis) of hip 06/01/2021   Primary osteoarthritis of left hip  06/01/2021   Esophageal dysphagia 10/31/2014   PCP:  Crist Infante, MD Pharmacy:   [facility] [place], [place] - [address] AT [address] [place] [address] Phone: [phone] Fax: [phone]  [place], [place] [address] Phone: [phone] Fax: [phone]     Social Determinants of Health (SDOH) Interventions    Readmission Risk Interventions     No data to display

## 2022-02-09 NOTE — Progress Notes (Signed)
PROGRESS NOTE  Janet Mccarthy NLG:[id number] DOB: [birth year] DOA: 02/07/2022 PCP: Crist Infante, MD   LOS: 1 day   Brief Narrative / Interim history: 87 year old female with hypothyroidism, HTN, CKD 3A comes to the hospital with fever and chills.  She was recently admitted about 10 days ago with sepsis secondary to UTI, also found to have E. coli bacteremia.  She was treated with Rocephin, and upon clinical improvement she was transitioned to oral antibiotics and discharged home.  She reports compliance with antibiotics, however after about 4 days from discharge started having fevers again.  Eventually got in touch with her PCP on 7/8 and was transitioned to Cipro, but fever persisted, along with nausea and vomiting, and she decided to come back to the hospital.  Imaging in the ED was concerning for right-sided pyelonephritis  Subjective / 24h Interval events: She is doing well this morning, had some vomiting last night, no nausea today  Assesement and Plan: Principal Problem:   Acute pyelonephritis Active Problems:   Hypothyroidism   GERD (gastroesophageal reflux disease)   Stage 3a chronic kidney disease (CKD) (HCC)   HTN (hypertension)   Normocytic anemia   Principal problem Sepsis due to pyelonephritis-she met sepsis criteria with leukocytosis, fever, tachycardia.  Patient admitted for the same, was found to have E. coli UTI as well as E. coli bacteremia.  She was treated with IV antibiotics for a few days, and upon improvement discharged home on orals based on antibiogram.  It is a bit unclear as to why she is having recurrent symptoms, ID consulted, appreciate input.  No renal abscess apparent on the CT scan  Active problems Normocytic anemia-stable, no evidence of bleed, follow  CKD 3A-creatinine appears at baseline.  Avoid nephrotoxins  Essential hypertension-her blood pressure meds were held last discharge, continue to hold, monitor  Hypothyroidism-continue  Synthroid  Lung nodule-4 mm, left upper lobe, follow up with PCP  Scheduled Meds:  amitriptyline  50 mg Oral QHS   enoxaparin (LOVENOX) injection  40 mg Subcutaneous Q24H   levothyroxine  75 mcg Oral QAC breakfast   pantoprazole  40 mg Oral q morning   sodium chloride  1 spray Each Nare Daily   Continuous Infusions:  cefTRIAXone (ROCEPHIN)  IV 2 g (02/09/22 0959)   lactated ringers 100 mL/hr at 02/09/22 0346   PRN Meds:.acetaminophen **OR** acetaminophen, hydrALAZINE, ondansetron (ZOFRAN) IV, mouth rinse  Diet Orders (From admission, onward)     Start     Ordered   02/08/22 1507  Diet Heart Room service appropriate? Yes; Fluid consistency: Thin  Diet effective now       Question Answer Comment  Room service appropriate? Yes   Fluid consistency: Thin      02/08/22 1507            DVT prophylaxis: enoxaparin (LOVENOX) injection 40 mg Start: 02/08/22 2200   Lab Results  Component Value Date   PLT 470 (H) 02/07/2022      Code Status: Full Code  Family Communication: Husband present at bedside  Status is: Inpatient  Remains inpatient appropriate because: Sepsis   Level of care: Telemetry  Consultants:  ID  Objective: Vitals:   02/08/22 1855 02/08/22 2033 02/09/22 0117 02/09/22 0458  BP: (!) 147/56 (!) 139/53 (!) 147/62 131/61  Pulse: (!) 110 (!) 105 (!) 103 94  Resp: _0 Temp: 98.8 F (37.1 C) 100.1 F (37.8 C) 99.5 F (37.5 C) 99.8 F (37.7 C)  TempSrc: Oral  Oral Oral Oral  SpO2: 99% 97% 92% 93%  Weight:      Height:        Intake/Output Summary (Last 24 hours) at 02/09/2022 1144 Last data filed at 02/09/2022 0846 Gross per 24 hour  Intake 1306.84 ml  Output --  Net 1306.84 ml   Wt Readings from Last 3 Encounters:  02/07/22 73.5 kg  01/30/22 80.7 kg  11/19/21 76.7 kg    Examination:  Constitutional: NAD Eyes: no scleral icterus ENMT: Mucous membranes are moist.  Neck: normal, supple Respiratory: clear to auscultation  bilaterally, no wheezing, no crackles. Normal respiratory effort.  Cardiovascular: Regular rate and rhythm, no murmurs / rubs / gallops. No LE edema.  Abdomen: non distended, no tenderness. Bowel sounds positive.  Musculoskeletal: no clubbing / cyanosis.  Skin: no rashes Neurologic: Nonfocal   Data Reviewed: I have independently reviewed following labs and imaging studies   CBC Recent Labs  Lab 02/07/22 1845  WBC 13.2*  HGB 9.2*  HCT 28.9*  PLT 470*  MCV 94.1  MCH 30.0  MCHC 31.8  RDW 15.6*  LYMPHSABS 0.7  MONOABS 1.6*  EOSABS 0.1  BASOSABS 0.0    Recent Labs  Lab 02/07/22 1845 02/07/22 2012 02/09/22 0421  NA 135  --   --   K 3.7  --   --   CL 100  --   --   CO2 24  --   --   GLUCOSE 102*  --   --   BUN 10  --   --   CREATININE 1.18*  --   --   CALCIUM 10.1  --   --   AST 8*  --   --   ALT 7  --   --   ALKPHOS 98  --   --   BILITOT 0.6  --   --   ALBUMIN 3.6  --   --   PROCALCITON  --   --  0.47  LATICACIDVEN 0.9 0.9  --   INR  --   --  1.2    ------------------------------------------------------------------------------------------------------------------ No results for input(s): "CHOL", "HDL", "LDLCALC", "TRIG", "CHOLHDL", "LDLDIRECT" in the last 72 hours.  No results found for: "HGBA1C" ------------------------------------------------------------------------------------------------------------------ No results for input(s): "TSH", "T4TOTAL", "T3FREE", "THYROIDAB" in the last 72 hours.  Invalid input(s): "FREET3"  Cardiac Enzymes No results for input(s): "CKMB", "TROPONINI", "MYOGLOBIN" in the last 168 hours.  Invalid input(s): "CK" ------------------------------------------------------------------------------------------------------------------ No results found for: "BNP"  CBG: No results for input(s): "GLUCAP" in the last 168 hours.  Recent Results (from the past 240 hour(s))  Resp Panel by RT-PCR (Flu A&B, Covid) Anterior Nasal Swab      Status: None   Collection Time: 02/07/22  6:27 PM   Specimen: Anterior Nasal Swab  Result Value Ref Range Status   SARS Coronavirus 2 by RT PCR NEGATIVE NEGATIVE Final    Comment: (NOTE) SARS-CoV-2 target nucleic acids are NOT DETECTED.  The SARS-CoV-2 RNA is generally detectable in upper respiratory specimens during the acute phase of infection. The lowest concentration of SARS-CoV-2 viral copies this assay can detect is 138 copies/mL. A negative result does not preclude SARS-Cov-2 infection and should not be used as the sole basis for treatment or other patient management decisions. A negative result may occur with  improper specimen collection/handling, submission of specimen other than nasopharyngeal swab, presence of viral mutation(s) within the areas targeted by this assay, and inadequate number of viral copies(<138 copies/mL). A negative result must be combined  with clinical observations, patient history, and epidemiological information. The expected result is Negative.  Fact Sheet for Patients:  EntrepreneurPulse.com.au  Fact Sheet for Healthcare Providers:  IncredibleEmployment.be  This test is no t yet approved or cleared by the [place] FDA and  has been authorized for detection and/or diagnosis of SARS-CoV-2 by FDA under an Emergency Use Authorization (EUA). This EUA will remain  in effect (meaning this test can be used) for the duration of the COVID-19 declaration under Section 564(b)(1) of the Act, 21 U.S.C.section 360bbb-3(b)(1), unless the authorization is terminated  or revoked sooner.       Influenza A by PCR NEGATIVE NEGATIVE Final   Influenza B by PCR NEGATIVE NEGATIVE Final    Comment: (NOTE) The Xpert Xpress SARS-CoV-2/FLU/RSV plus assay is intended as an aid in the diagnosis of influenza from Nasopharyngeal swab specimens and should not be used as a sole basis for treatment. Nasal washings and aspirates are  unacceptable for Xpert Xpress SARS-CoV-2/FLU/RSV testing.  Fact Sheet for Patients: EntrepreneurPulse.com.au  Fact Sheet for Healthcare Providers: IncredibleEmployment.be  This test is not yet approved or cleared by the [place] FDA and has been authorized for detection and/or diagnosis of SARS-CoV-2 by FDA under an Emergency Use Authorization (EUA). This EUA will remain in effect (meaning this test can be used) for the duration of the COVID-19 declaration under Section 564(b)(1) of the Act, 21 U.S.C. section 360bbb-3(b)(1), unless the authorization is terminated or revoked.  Performed at [facility], [address] [place], [place] [address]   Culture, blood (routine x 2)     Status: None (Preliminary result)   Collection Time: 02/07/22  6:27 PM   Specimen: BLOOD  Result Value Ref Range Status   Specimen Description   Final    BLOOD RIGHT ANTECUBITAL Performed at [facility], [address], [place], [place] [address]    Special Requests   Final    BOTTLES DRAWN AEROBIC AND ANAEROBIC Blood Culture adequate volume Performed at [facility], [address] [place], [place] [address]    Culture   Final    NO GROWTH 2 DAYS Performed at [facility], [place] [address] [place], [place] [address]    Report Status PENDING  Incomplete  Culture, blood (routine x 2)     Status: None (Preliminary result)   Collection Time: 02/07/22  6:27 PM   Specimen: BLOOD  Result Value Ref Range Status   Specimen Description   Final    BLOOD LEFT ANTECUBITAL Performed at [facility], [address], [place], [place] [address]    Special Requests   Final    BOTTLES DRAWN AEROBIC AND ANAEROBIC Blood Culture adequate volume Performed at [facility], [address] [place], [place] [address]    Culture   Final    NO GROWTH 2 DAYS Performed at  [facility], [place] [address] [place], [place] [address]    Report Status PENDING  Incomplete  Urine Culture     Status: None   Collection Time: 02/07/22  8:16 PM   Specimen: Urine, Clean Catch  Result Value Ref Range Status   Specimen Description   Final    URINE, CLEAN CATCH Performed at [facility], [address], [place], [place] [address]    Special Requests   Final    NONE Performed at [facility], [address], [place],  [address]    Culture   Final  NO GROWTH Performed at [facility], [place] [address], [place], [place] [address]    Report Status 02/09/2022 FINAL  Final     Radiology Studies: No results found.   Marzetta Board, MD, PhD [facility]  Between 7 am - 7 pm I am available, please contact me via Amion (for emergencies) or Securechat (non urgent messages)  Between 7 pm - 7 am I am not available, please contact night coverage MD/APP via Amion

## 2022-02-09 NOTE — Consult Note (Signed)
[facility]                                                                                        Patient Identification: Patient Name: Janet Mccarthy MRN: [id number] [place] Date: 02/07/2022  5:51 PM Today's Date: 02/09/2022 Reason for consult: fevers and chills, recurrent pyelonephritis  Requesting provider: Marzetta Board  Principal Problem:   Acute pyelonephritis Active Problems:   Hypothyroidism   GERD (gastroesophageal reflux disease)   Stage 3a chronic kidney disease (CKD) ([place])   HTN (hypertension)   Normocytic anemia  Antibiotics:  Ceftriaxone 7/9-c  Lines/Hardware: Rt and left hip arthroplasty   Assessment Fevers: likely 2/2 recent E coli bacteremia/Pyelonephritis, failed tx with PO cefadroxil  Nausea/Vomiting- seems to be improving, on IV abtx   Recommendations  Continue ceftriaxone for now pending blood cx/febrile Fu blood cultures to see if recurrent bacteremia  Monitor fever curve D/w primary  Following  Rest of the management as per the primary team. Please call with questions or concerns.  Thank you for the consult  Rosiland Oz, MD Infectious Disease Physician [facility] [address] [place], [place] [address] Phone: [phone]  Fax: [phone]  __________________________________________________________________________________________________________ HPI and Hospital Course: 87 year old female with PMH of COPD, arthritis s/p right and left hip arthroplasty, anxiety/depression, diverticulosis, GERD, hyperlipidemia, hypothyroidism, hyperparathyroidism who presented to the ED on 7/10 with fever and chills.  Patient was recently discharged on 7/2 after being hospitalized for E. coli bacteremia secondary to E. coli pyelonephritis.  Received IV ceftriaxone in the hospital and was discharged on  cefadroxil for 10 days on 7/2. She was taking PO cefadroxil as instructed without missing doses but started having chills followed by fever T max 102 Sunday evening and came to ED. She also took one dose of ciprofloxacin over the weekend prior to ED visit. Denies ever having UTI in the past or GU symptoms whatsoever. She vomited couple of times after being hospitalized( [facility]) but no abdominal pain, diarrhea. Mild cough but no chest pain and SOB. Denies back pain, joint pain, rashes and vaginal discharge. Denies runny nose, watering eyes and sore throat. Denies recent travel and sick contact except with her son who had contact with his brother in law who was sick a week ago. No issues at the bilateral hips.   At ED, febrile with Tmax 102.8, leukocytosis WBC 13.2 7/9 UA unremarkable 7/9 blood cultures no growth to date 7/9 CT chest abdomen pelvis patchy hypoenhancement of the kidneys bilaterally with mild fat stranding at the right renal pelvis compatible with pyelonephritis.  No renal calculus or hydronephrosis  ROS: all systems reviewed with pertinent positives and negatives as listed above  Past Medical History:  Diagnosis Date   Anxiety    Arthritis    right hip   COPD (chronic obstructive pulmonary disease) (HCC)    mild   Depression    Diverticular disease    sigmoid   Diverticulitis    Diverticulosis    Gallstones    GERD (gastroesophageal reflux disease)    Hyperlipidemia    Hyperparathyroidism ([place])  Hypothyroidism    IFG (impaired fasting glucose)    Multiple lipomas    Osteoarthritis    Osteopenia    Retroperitoneal mass    Skin cancer    nose   Vitamin D deficiency    Past Surgical History:  Procedure Laterality Date   COLON RESECTION  2010   with tumor removal   COLONOSCOPY WITH ESOPHAGOGASTRODUODENOSCOPY (EGD)  06/2018   TONSILLECTOMY AND ADENOIDECTOMY     TOTAL HIP ARTHROPLASTY  2004   right   TOTAL HIP ARTHROPLASTY Left 06/01/2021   Procedure: TOTAL HIP  ARTHROPLASTY ANTERIOR APPROACH;  Surgeon: Gaynelle Arabian, MD;  Location: [facility];  Service: Orthopedics;  Laterality: Left;     Scheduled Meds:  amitriptyline  50 mg Oral QHS   enoxaparin (LOVENOX) injection  40 mg Subcutaneous Q24H   levothyroxine  75 mcg Oral QAC breakfast   pantoprazole  40 mg Oral q morning   sodium chloride  1 spray Each Nare Daily   Continuous Infusions:  cefTRIAXone (ROCEPHIN)  IV     lactated ringers 100 mL/hr at 02/09/22 0346   PRN Meds:.acetaminophen **OR** acetaminophen, hydrALAZINE, ondansetron (ZOFRAN) IV, mouth rinse  Allergies  Allergen Reactions   Omeprazole Other (See Comments)    Other reaction(s): abd pain   Rabeprazole Swelling and Other (See Comments)    Other reaction(s): lip redness and swelling   Sulfa Antibiotics Itching    Other reaction(s): vomiting   Social History   Socioeconomic History   Marital status: Married    Spouse name: Joe   Number of children: 2   Years of education: Not on file   Highest education level: Not on file  Occupational History   Occupation: homemaker  Tobacco Use   Smoking status: Former    Packs/day: 2.00    Years: 15.00    Total pack years: 30.00    Types: Cigarettes    Quit date: 08/03/1971    Years since quitting: 50.5   Smokeless tobacco: Never  Vaping Use   Vaping Use: Never used  Substance and Sexual Activity   Alcohol use: No   Drug use: No   Sexual activity: Not on file  Other Topics Concern   Not on file  Social History Narrative   Not on file   Social Determinants of Health   Financial Resource Strain: Not on file  Food Insecurity: Not on file  Transportation Needs: Not on file  Physical Activity: Not on file  Stress: Not on file  Social Connections: Not on file  Intimate Partner Violence: Not on file   Family History  Adopted: Yes      Vitals BP 131/61 (BP Location: Right Arm)   Pulse 94   Temp 99.8 F (37.7 C) (Oral)   Resp 20   Ht '5\' 6"'$  (1.676 m)   Wt 73.5 kg    SpO2 93%   BMI 26.15 kg/m    Physical Exam Constitutional:  lying in the bed and appears comfortable     Comments:   Cardiovascular:     Rate and Rhythm: Normal rate and regular rhythm.     Heart sounds:   Pulmonary:     Effort: Pulmonary effort is normal.     Comments: Normal breath sounds   Abdominal:     Palpations: Abdomen is soft.     Tenderness: non distended and non tender   Musculoskeletal:        General: No swelling or tenderness.   Skin:  Comments:  No obvious rashes   Neurological:     General: Grossly non focal, awake, alert and oriented   Psychiatric:        Mood and Affect: Mood normal.    Pertinent Microbiology Results for orders placed or performed during the hospital encounter of 02/07/22  Resp Panel by RT-PCR (Flu A&B, Covid) Anterior Nasal Swab     Status: None   Collection Time: 02/07/22  6:27 PM   Specimen: Anterior Nasal Swab  Result Value Ref Range Status   SARS Coronavirus 2 by RT PCR NEGATIVE NEGATIVE Final    Comment: (NOTE) SARS-CoV-2 target nucleic acids are NOT DETECTED.  The SARS-CoV-2 RNA is generally detectable in upper respiratory specimens during the acute phase of infection. The lowest concentration of SARS-CoV-2 viral copies this assay can detect is 138 copies/mL. A negative result does not preclude SARS-Cov-2 infection and should not be used as the sole basis for treatment or other patient management decisions. A negative result may occur with  improper specimen collection/handling, submission of specimen other than nasopharyngeal swab, presence of viral mutation(s) within the areas targeted by this assay, and inadequate number of viral copies(<138 copies/mL). A negative result must be combined with clinical observations, patient history, and epidemiological information. The expected result is Negative.  Fact Sheet for Patients:  EntrepreneurPulse.com.au  Fact Sheet for Healthcare Providers:   IncredibleEmployment.be  This test is no t yet approved or cleared by the [place] FDA and  has been authorized for detection and/or diagnosis of SARS-CoV-2 by FDA under an Emergency Use Authorization (EUA). This EUA will remain  in effect (meaning this test can be used) for the duration of the COVID-19 declaration under Section 564(b)(1) of the Act, 21 U.S.C.section 360bbb-3(b)(1), unless the authorization is terminated  or revoked sooner.       Influenza A by PCR NEGATIVE NEGATIVE Final   Influenza B by PCR NEGATIVE NEGATIVE Final    Comment: (NOTE) The Xpert Xpress SARS-CoV-2/FLU/RSV plus assay is intended as an aid in the diagnosis of influenza from Nasopharyngeal swab specimens and should not be used as a sole basis for treatment. Nasal washings and aspirates are unacceptable for Xpert Xpress SARS-CoV-2/FLU/RSV testing.  Fact Sheet for Patients: EntrepreneurPulse.com.au  Fact Sheet for Healthcare Providers: IncredibleEmployment.be  This test is not yet approved or cleared by the [place] FDA and has been authorized for detection and/or diagnosis of SARS-CoV-2 by FDA under an Emergency Use Authorization (EUA). This EUA will remain in effect (meaning this test can be used) for the duration of the COVID-19 declaration under Section 564(b)(1) of the Act, 21 U.S.C. section 360bbb-3(b)(1), unless the authorization is terminated or revoked.  Performed at [facility], [address] [place], [place] [address]   Culture, blood (routine x 2)     Status: None (Preliminary result)   Collection Time: 02/07/22  6:27 PM   Specimen: BLOOD  Result Value Ref Range Status   Specimen Description   Final    BLOOD RIGHT ANTECUBITAL Performed at [facility], [address] [place], [place] [address]    Special Requests   Final    BOTTLES DRAWN AEROBIC AND ANAEROBIC Blood Culture  adequate volume Performed at [facility], [address], [place], [place] [address]    Culture   Final    NO GROWTH 2 DAYS Performed at [facility], [place] [address], [place], [place] [address]    Report Status PENDING  Incomplete  Culture,  blood (routine x 2)     Status: None (Preliminary result)   Collection Time: 02/07/22  6:27 PM   Specimen: BLOOD  Result Value Ref Range Status   Specimen Description   Final    BLOOD LEFT ANTECUBITAL Performed at [facility], [address] [place], [place] [address]    Special Requests   Final    BOTTLES DRAWN AEROBIC AND ANAEROBIC Blood Culture adequate volume Performed at [facility], [address] [place], [place] [address]    Culture   Final    NO GROWTH 2 DAYS Performed at [facility], [place] [address] [place], [place] [address]    Report Status PENDING  Incomplete     Pertinent Lab seen by me:    Latest Ref Rng & Units 02/07/2022    6:45 PM 01/31/2022    5:36 AM 01/30/2022    5:39 AM  CBC  WBC 4.0 - 10.5 K/uL 13.2  10.6  10.9   Hemoglobin 12.0 - 15.0 g/dL 9.2  9.0  9.2   Hematocrit 36.0 - 46.0 % 28.9  29.1  29.2   Platelets 150 - 400 K/uL 470  250  209       Latest Ref Rng & Units 02/07/2022    6:45 PM 01/31/2022    5:36 AM 01/30/2022    5:39 AM  CMP  Glucose 70 - 99 mg/dL 102  105  110   BUN 8 - 23 mg/dL '10  17  19   '$ Creatinine 0.44 - 1.00 mg/dL 1.18  1.21  1.12   Sodium 135 - 145 mmol/L 135  134  132   Potassium 3.5 - 5.1 mmol/L 3.7  3.8  3.9   Chloride 98 - 111 mmol/L 100  102  102   CO2 22 - 32 mmol/L '24  25  24   '$ Calcium 8.9 - 10.3 mg/dL 10.1  9.6  9.5   Total Protein 6.5 - 8.1 g/dL 7.0     Total Bilirubin 0.3 - 1.2 mg/dL 0.6     Alkaline Phos 38 - 126 U/L 98     AST 15 - 41 U/L 8     ALT 0 - 44 U/L 7        Pertinent Imagings/Other Imagings Plain films and CT images have been personally visualized and interpreted; radiology reports have  been reviewed. Decision making incorporated into the Impression / Recommendations.  CT CHEST ABDOMEN PELVIS W CONTRAST  Result Date: 02/07/2022 CLINICAL DATA:  Sepsis, fever, cough, recent UTI. EXAM: CT CHEST, ABDOMEN, AND PELVIS WITH CONTRAST TECHNIQUE: Multidetector CT imaging of the chest, abdomen and pelvis was performed following the standard protocol during bolus administration of intravenous contrast. RADIATION DOSE REDUCTION: This exam was performed according to the departmental dose-optimization program which includes automated exposure control, adjustment of the mA and/or kV according to patient size and/or use of iterative reconstruction technique. CONTRAST:  8m OMNIPAQUE IOHEXOL 300 MG/ML  SOLN COMPARISON:  01/28/2022. FINDINGS: CT CHEST FINDINGS Cardiovascular: The heart is normal in size and there is a trace pericardial effusion. Scattered coronary artery calcifications are noted. There is atherosclerotic calcification of the aorta without evidence of aneurysm. The pulmonary trunk is normal in caliber. Mediastinum/Nodes: No mediastinal, hilar, or axillary lymphadenopathy. A few nodules are present in the thyroid gland bilaterally, the largest measuring 1 cm. The trachea and esophagus are within normal limits. There is a moderate hiatal hernia. Lungs/Pleura: There is atelectasis at the lung bases with  small bilateral pleural effusions. No pneumothorax. A 4 mm nodule is present in the left upper lobe, axial image 90. Musculoskeletal: Degenerative changes are present in the thoracic spine. No acute osseous abnormality. CT ABDOMEN PELVIS FINDINGS Hepatobiliary: No focal liver abnormality is seen. Multiple stones are present within the gallbladder. No biliary ductal dilatation. Pancreas: There is pancreatic atrophy at the head. No pancreatic ductal dilatation or surrounding inflammatory changes. Spleen: Normal in size without focal abnormality. Adrenals/Urinary Tract: The adrenal glands are within  normal limits. Cysts are noted in the kidneys bilaterally. Additional subcentimeter hypodensities are noted bilaterally which are too small to further characterize. Patchy areas of hypoenhancement are present in the kidneys bilaterally and best seen on delayed imaging. No renal calculus or hydronephrosis. There is mild fat stranding at the renal pelvis on the right. The bladder is not well seen due to metallic hardware artifact. Stomach/Bowel: Moderate hiatal hernia. Stomach is otherwise within normal limits. Appendix is not seen. No evidence of bowel wall thickening, distention, or inflammatory changes. No free air or pneumatosis. Vascular/Lymphatic: Aortic atherosclerosis. No enlarged abdominal or pelvic lymph nodes. Reproductive: Coarse calcifications are present in the uterus, likely degenerating fibroids. No adnexal mass. Other: No abdominopelvic ascites. Musculoskeletal: Degenerative changes are present in the lumbar spine. Bilateral hip arthroplasty changes are noted. No acute osseous abnormality. IMPRESSION: 1. Patchy hypoenhancement of the kidneys bilaterally with mild fat stranding at the right renal pelvis, compatible with pyelonephritis. No renal calculus or hydronephrosis. 2. Bilateral renal cysts. 3. 4 mm left upper lobe nodule. No follow-up needed if patient is low-risk.This recommendation follows the consensus statement: Guidelines for Management of Incidental Pulmonary Nodules Detected on CT Images: From the Fleischner Society 2017; Radiology 2017; 284:228-243. 4. Cholelithiasis. 5. Moderate hiatal hernia. 6. Aortic atherosclerosis and coronary artery calcifications. 7. Remaining incidental findings as described above. Electronically Signed   By: Brett Fairy M.D.   On: 02/07/2022 23:36   DG Chest Portable 1 View  Result Date: 02/07/2022 CLINICAL DATA:  Fever. EXAM: PORTABLE CHEST 1 VIEW COMPARISON:  Chest x-ray 01/27/2022 FINDINGS: The heart size and mediastinal contours are within normal  limits. There is blunting of the left costophrenic angle. There are minimal patchy opacities in the left lung base. No pneumothorax. Degenerative changes affect the left shoulder. IMPRESSION: 1. Small left pleural effusion with minimal left lower lobe atelectasis/airspace disease. Electronically Signed   By: Ronney Asters M.D.   On: 02/07/2022 19:18   CT RENAL STONE STUDY  Result Date: 01/28/2022 CLINICAL DATA:  Flank pain.  Concern for kidney stone. EXAM: CT ABDOMEN AND PELVIS WITHOUT CONTRAST TECHNIQUE: Multidetector CT imaging of the abdomen and pelvis was performed following the standard protocol without IV contrast. RADIATION DOSE REDUCTION: This exam was performed according to the departmental dose-optimization program which includes automated exposure control, adjustment of the mA and/or kV according to patient size and/or use of iterative reconstruction technique. COMPARISON:  None Available. FINDINGS: Evaluation of this exam is limited in the absence of intravenous contrast. Lower chest: Trace bilateral pleural effusions versus pleural thickening. There are bibasilar scarring. There is calcification of the mitral annulus. No intra-abdominal free air or free fluid. Hepatobiliary: The liver is unremarkable. No intrahepatic biliary dilatation. Multiple gallstones. No pericholecystic fluid or evidence of acute cholecystitis by CT. Pancreas: Unremarkable. No pancreatic ductal dilatation or surrounding inflammatory changes. Spleen: Normal in size without focal abnormality. Adrenals/Urinary Tract: The adrenal glands are unremarkable. There is no hydronephrosis or nephrolithiasis on either side. There is right perinephric  and periureteric stranding with minimal fullness of the right ureter. Findings may be related to a recently passed right renal calculus versus pyelonephritis. Correlation with urinalysis recommended. Several bilateral renal cysts measure up to 4.5 cm in the upper pole of the right kidney.  Additional subcentimeter hypodense lesions are too small to characterize on this noncontrast CT. This can be better evaluated with ultrasound on a nonemergent/outpatient basis. The bladder is collapsed but poorly visualized due to streak artifact caused by bilateral hip arthroplasties. Stomach/Bowel: There is a small hiatal hernia. There is no bowel obstruction or active inflammation. No CT evidence of acute appendicitis. Vascular/Lymphatic: Advanced aortoiliac atherosclerotic disease. The IVC is grossly unremarkable. No portal venous gas. There is no adenopathy. Reproductive: Several calcified fibroids.  No adnexal masses. Other: Midline vertical anterior pelvic wall incisional scar. Musculoskeletal: Degenerative changes of the spine. Bilateral total hip arthroplasties. No acute osseous pathology. IMPRESSION: 1. No hydronephrosis or nephrolithiasis. Possible recently passed right renal calculus versus pyelonephritis. Correlation with urinalysis recommended. 2. Cholelithiasis. 3. Aortic Atherosclerosis (ICD10-I70.0). Electronically Signed   By: Anner Crete M.D.   On: 01/28/2022 13:26   DG Chest Port 1 View  Result Date: 01/27/2022 CLINICAL DATA:  Fever, sepsis EXAM: PORTABLE CHEST 1 VIEW COMPARISON:  05/28/2004 FINDINGS: The heart size and mediastinal contours are within normal limits. Both lungs are clear. The visualized skeletal structures are unremarkable. IMPRESSION: No active disease. Electronically Signed   By: Franchot Gallo M.D.   On: 01/27/2022 17:34    I spent more than 80 minutes for this patient encounter including review of prior medical records/discussing diagnostics and treatment plan with the patient/family/coordinate care with primary/other specialits with greater than 50% of time in face to face encounter.   Electronically signed by:   Rosiland Oz, MD Infectious Disease Physician [facility] Pager: [phone]

## 2022-02-10 DIAGNOSIS — N12 Tubulo-interstitial nephritis, not specified as acute or chronic: Secondary | ICD-10-CM

## 2022-02-10 DIAGNOSIS — N1831 Chronic kidney disease, stage 3a: Secondary | ICD-10-CM

## 2022-02-10 DIAGNOSIS — D649 Anemia, unspecified: Secondary | ICD-10-CM

## 2022-02-10 DIAGNOSIS — I10 Essential (primary) hypertension: Secondary | ICD-10-CM | POA: Diagnosis not present

## 2022-02-10 DIAGNOSIS — N10 Acute tubulo-interstitial nephritis: Secondary | ICD-10-CM | POA: Diagnosis not present

## 2022-02-10 LAB — CBC
HCT: 26.5 % — ABNORMAL LOW (ref 36.0–46.0)
Hemoglobin: 8 g/dL — ABNORMAL LOW (ref 12.0–15.0)
MCH: 29.3 pg (ref 26.0–34.0)
MCHC: 30.2 g/dL (ref 30.0–36.0)
MCV: 97.1 fL (ref 80.0–100.0)
Platelets: 406 10*3/uL — ABNORMAL HIGH (ref 150–400)
RBC: 2.73 MIL/uL — ABNORMAL LOW (ref 3.87–5.11)
RDW: 15.9 % — ABNORMAL HIGH (ref 11.5–15.5)
WBC: 13.3 10*3/uL — ABNORMAL HIGH (ref 4.0–10.5)
nRBC: 0 % (ref 0.0–0.2)

## 2022-02-10 LAB — COMPREHENSIVE METABOLIC PANEL
ALT: 10 U/L (ref 0–44)
AST: 10 U/L — ABNORMAL LOW (ref 15–41)
Albumin: 2.5 g/dL — ABNORMAL LOW (ref 3.5–5.0)
Alkaline Phosphatase: 95 U/L (ref 38–126)
Anion gap: 7 (ref 5–15)
BUN: 11 mg/dL (ref 8–23)
CO2: 27 mmol/L (ref 22–32)
Calcium: 9.6 mg/dL (ref 8.9–10.3)
Chloride: 103 mmol/L (ref 98–111)
Creatinine, Ser: 1.18 mg/dL — ABNORMAL HIGH (ref 0.44–1.00)
GFR, Estimated: 45 mL/min — ABNORMAL LOW (ref >60)
Glucose, Bld: 118 mg/dL — ABNORMAL HIGH (ref 70–99)
Potassium: 4.2 mmol/L (ref 3.5–5.1)
Sodium: 137 mmol/L (ref 135–145)
Total Bilirubin: 0.5 mg/dL (ref 0.3–1.2)
Total Protein: 6.1 g/dL — ABNORMAL LOW (ref 6.5–8.1)

## 2022-02-10 LAB — MAGNESIUM: Magnesium: 1.8 mg/dL (ref 1.7–2.4)

## 2022-02-10 NOTE — Progress Notes (Signed)
[facility] PROGRESS NOTE    Progress Note  Janet Mccarthy  GDJ:[id number] DOB: 06-17-1935 DOA: 02/07/2022 PCP: Crist Infante, MD     Brief Narrative:   Janet Mccarthy is an 87 y.o. female past medical history of hypothyroidism, essential hypertension, chronic kidney stage IIIa comes into the hospital with fever and chills she was recently discharged about 10 days prior to admission secondary to sepsis due to UTI was found to have E. coli bacteremia treated with Rocephin and discharged on oral antibiotics.  4 days after discharge she start experiencing fevers again Assessment/Plan:   Sepsis secondary to acute pyelonephritis which has failed outpatient oral antibiotic treatment: Last blood culture and urine culture grew E. coli resistant to penicillin.  We will failed oral cefadroxil as an outpatient. CT scan of the abdomen pelvis was done that showed no abscess. Tmax of 101, with a leukocytosis of 13. Surveillance blood cultures on 02/07/2022 have remained negative till date. ID was consulted she was started empirically on Rocephin. Repeated blood cultures are pending.   Normocytic anemia: Follow-up with PCP as an outpatient.  Chronic kidney stage IIIa: Creatinine appears to be at baseline.  Central hypertension: Antihypertensive medications were held on admission due to borderline blood pressure.  Hypothyroidism Continue Synthroid.  Incidental 4 mm left upper lobe lung nodule: To follow-up with PCP 4 to 6 weeks.   DVT prophylaxis: loveno Family Communication:husband Status is: Inpatient Remains inpatient appropriate because: Sepsis due to to acute acute pyelonephritis which failed oral antibiotic    Code Status:     Code Status Orders  (From admission, onward)           Start     Ordered   02/08/22 1620  Full code  Continuous        02/08/22 1623           Code Status History     Date Active Date Inactive Code Status Order ID Comments  User Context   01/27/2022 1612 01/31/2022 1731 Full Code [id number]  Antonieta Pert, MD ED   06/01/2021 1656 06/02/2021 2032 Full Code [id number]  Derl Barrow, PA Inpatient      Advance Directive Documentation    Flowsheet Row Most Recent Value  Type of Advance Directive Healthcare Power of Attorney  Pre-existing out of facility DNR order (yellow form or pink MOST form) --  "MOST" Form in Place? --         IV Access:   Peripheral IV   Procedures and diagnostic studies:   No results found.   Medical Consultants:   None.   Subjective:    [name] continues to have fevers.  Objective:    Vitals:   02/10/22 0347 02/10/22 0528 02/10/22 0631 02/10/22 0900  BP:   (!) 120/59   Pulse:   88   Resp:   20 (!) 22  Temp: (!) 100.8 F (38.2 C) 98.4 F (36.9 C) 98.4 F (36.9 C)   TempSrc: Oral Oral Oral   SpO2:   93%   Weight:      Height:       SpO2: 93 %   Intake/Output Summary (Last 24 hours) at 02/10/2022 1119 Last data filed at 02/10/2022 0300 Gross per 24 hour  Intake 2313.44 ml  Output --  Net 2313.44 ml   Filed Weights   02/07/22 1758  Weight: 73.5 kg    Exam: General exam: In no acute distress. Respiratory system: Good air movement and clear  to auscultation. Cardiovascular system: S1 & S2 heard, RRR. No JVD. Gastrointestinal system: Abdomen is nondistended, soft and nontender.  Extremities: No pedal edema. Skin: No rashes, lesions or ulcers Psychiatry: Judgement and insight appear normal. Mood & affect appropriate.    Data Reviewed:    Labs: Basic Metabolic Panel: Recent Labs  Lab 02/07/22 1845 02/10/22 0440  NA 135 137  K 3.7 4.2  CL 100 103  CO2 24 27  GLUCOSE 102* 118*  BUN 10 11  CREATININE 1.18* 1.18*  CALCIUM 10.1 9.6  MG  --  1.8   GFR Estimated Creatinine Clearance: 35.8 mL/min (A) (by C-G formula based on SCr of 1.18 mg/dL (H)). Liver Function Tests: Recent Labs  Lab 02/07/22 1845 02/10/22 0440   AST 8* 10*  ALT 7 10  ALKPHOS 98 95  BILITOT 0.6 0.5  PROT 7.0 6.1*  ALBUMIN 3.6 2.5*   No results for input(s): "LIPASE", "AMYLASE" in the last 168 hours. No results for input(s): "AMMONIA" in the last 168 hours. Coagulation profile Recent Labs  Lab 02/09/22 0421  INR 1.2   COVID-19 Labs  Recent Labs    02/09/22 1518  CRP 17.4*    Lab Results  Component Value Date   SARSCOV2NAA NEGATIVE 02/07/2022   SARSCOV2NAA NEGATIVE 01/27/2022   SARSCOV2NAA RESULT: NEGATIVE 05/28/2021    CBC: Recent Labs  Lab 02/07/22 1845 02/10/22 0440  WBC 13.2* 13.3*  NEUTROABS 10.7*  --   HGB 9.2* 8.0*  HCT 28.9* 26.5*  MCV 94.1 97.1  PLT 470* 406*   Cardiac Enzymes: No results for input(s): "CKTOTAL", "CKMB", "CKMBINDEX", "TROPONINI" in the last 168 hours. BNP (last 3 results) No results for input(s): "PROBNP" in the last 8760 hours. CBG: No results for input(s): "GLUCAP" in the last 168 hours. D-Dimer: No results for input(s): "DDIMER" in the last 72 hours. Hgb A1c: No results for input(s): "HGBA1C" in the last 72 hours. Lipid Profile: No results for input(s): "CHOL", "HDL", "LDLCALC", "TRIG", "CHOLHDL", "LDLDIRECT" in the last 72 hours. Thyroid function studies: No results for input(s): "TSH", "T4TOTAL", "T3FREE", "THYROIDAB" in the last 72 hours.  Invalid input(s): "FREET3" Anemia work up: No results for input(s): "VITAMINB12", "FOLATE", "FERRITIN", "TIBC", "IRON", "RETICCTPCT" in the last 72 hours. Sepsis Labs: Recent Labs  Lab 02/07/22 1845 02/07/22 2012 02/09/22 0421 02/10/22 0440  PROCALCITON  --   --  0.47  --   WBC 13.2*  --   --  13.3*  LATICACIDVEN 0.9 0.9  --   --    Microbiology Recent Results (from the past 240 hour(s))  Resp Panel by RT-PCR (Flu A&B, Covid) Anterior Nasal Swab     Status: None   Collection Time: 02/07/22  6:27 PM   Specimen: Anterior Nasal Swab  Result Value Ref Range Status   SARS Coronavirus 2 by RT PCR NEGATIVE NEGATIVE Final     Comment: (NOTE) SARS-CoV-2 target nucleic acids are NOT DETECTED.  The SARS-CoV-2 RNA is generally detectable in upper respiratory specimens during the acute phase of infection. The lowest concentration of SARS-CoV-2 viral copies this assay can detect is 138 copies/mL. A negative result does not preclude SARS-Cov-2 infection and should not be used as the sole basis for treatment or other patient management decisions. A negative result may occur with  improper specimen collection/handling, submission of specimen other than nasopharyngeal swab, presence of viral mutation(s) within the areas targeted by this assay, and inadequate number of viral copies(<138 copies/mL). A negative result must be combined with clinical  observations, patient history, and epidemiological information. The expected result is Negative.  Fact Sheet for Patients:  EntrepreneurPulse.com.au  Fact Sheet for Healthcare Providers:  IncredibleEmployment.be  This test is no t yet approved or cleared by the [place] FDA and  has been authorized for detection and/or diagnosis of SARS-CoV-2 by FDA under an Emergency Use Authorization (EUA). This EUA will remain  in effect (meaning this test can be used) for the duration of the COVID-19 declaration under Section 564(b)(1) of the Act, 21 U.S.C.section 360bbb-3(b)(1), unless the authorization is terminated  or revoked sooner.       Influenza A by PCR NEGATIVE NEGATIVE Final   Influenza B by PCR NEGATIVE NEGATIVE Final    Comment: (NOTE) The Xpert Xpress SARS-CoV-2/FLU/RSV plus assay is intended as an aid in the diagnosis of influenza from Nasopharyngeal swab specimens and should not be used as a sole basis for treatment. Nasal washings and aspirates are unacceptable for Xpert Xpress SARS-CoV-2/FLU/RSV testing.  Fact Sheet for Patients: EntrepreneurPulse.com.au  Fact Sheet for Healthcare  Providers: IncredibleEmployment.be  This test is not yet approved or cleared by the [place] FDA and has been authorized for detection and/or diagnosis of SARS-CoV-2 by FDA under an Emergency Use Authorization (EUA). This EUA will remain in effect (meaning this test can be used) for the duration of the COVID-19 declaration under Section 564(b)(1) of the Act, 21 U.S.C. section 360bbb-3(b)(1), unless the authorization is terminated or revoked.  Performed at [facility], [address], [place], [place] [address]   Culture, blood (routine x 2)     Status: None (Preliminary result)   Collection Time: 02/07/22  6:27 PM   Specimen: BLOOD  Result Value Ref Range Status   Specimen Description   Final    BLOOD RIGHT ANTECUBITAL Performed at [facility], [address] [place], [place] [address]    Special Requests   Final    BOTTLES DRAWN AEROBIC AND ANAEROBIC Blood Culture adequate volume Performed at [facility], [address] [place], [place] [address]    Culture   Final    NO GROWTH 3 DAYS Performed at [facility], [place] [address] [place], [place] [address]    Report Status PENDING  Incomplete  Culture, blood (routine x 2)     Status: None (Preliminary result)   Collection Time: 02/07/22  6:27 PM   Specimen: BLOOD  Result Value Ref Range Status   Specimen Description   Final    BLOOD LEFT ANTECUBITAL Performed at [facility], [address] [place], [place] [address]    Special Requests   Final    BOTTLES DRAWN AEROBIC AND ANAEROBIC Blood Culture adequate volume Performed at [facility], [address] [place], [place] [address]    Culture   Final    NO GROWTH 3 DAYS Performed at [facility], [place] [address] [place], [place] [address]    Report Status PENDING  Incomplete  Urine Culture     Status: None   Collection Time:  02/07/22  8:16 PM   Specimen: Urine, Clean Catch  Result Value Ref Range Status   Specimen Description   Final    URINE, CLEAN CATCH Performed at [facility], [address] [place],  [address]    Special Requests   Final    NONE Performed at [facility], [address] [place],  [address]    Culture   Final  NO GROWTH Performed at [facility], [place] [address] [place], [place] [address]    Report Status 02/09/2022 FINAL  Final     Medications:    amitriptyline  50 mg Oral QHS   enoxaparin (LOVENOX) injection  40 mg Subcutaneous Q24H   levothyroxine  75 mcg Oral QAC breakfast   pantoprazole  40 mg Oral q morning   sodium chloride  1 spray Each Nare Daily   Continuous Infusions:  cefTRIAXone (ROCEPHIN)  IV 2 g (02/10/22 0910)   lactated ringers 100 mL/hr at 02/09/22 1445      LOS: 2 days   Charlynne Cousins  [facility]  02/10/2022, 11:19 AM

## 2022-02-10 NOTE — Plan of Care (Signed)
  Problem: Education: Goal: Knowledge of General Education information will improve Description: Including pain rating scale, medication(s)/side effects and non-pharmacologic comfort measures Outcome: Progressing   Problem: Clinical Measurements: Goal: Will remain free from infection Outcome: Progressing   Problem: Fluid Volume: Goal: Hemodynamic stability will improve Outcome: Progressing   Problem: Clinical Measurements: Goal: Diagnostic test results will improve Outcome: Progressing Goal: Signs and symptoms of infection will decrease Outcome: Progressing

## 2022-02-11 DIAGNOSIS — N12 Tubulo-interstitial nephritis, not specified as acute or chronic: Secondary | ICD-10-CM | POA: Diagnosis not present

## 2022-02-11 DIAGNOSIS — I10 Essential (primary) hypertension: Secondary | ICD-10-CM | POA: Diagnosis not present

## 2022-02-11 DIAGNOSIS — D649 Anemia, unspecified: Secondary | ICD-10-CM | POA: Diagnosis not present

## 2022-02-11 DIAGNOSIS — N10 Acute tubulo-interstitial nephritis: Secondary | ICD-10-CM | POA: Diagnosis not present

## 2022-02-11 LAB — CBC WITH DIFFERENTIAL/PLATELET
Abs Immature Granulocytes: 0.05 10*3/uL (ref 0.00–0.07)
Basophils Absolute: 0 10*3/uL (ref 0.0–0.1)
Basophils Relative: 0 %
Eosinophils Absolute: 0.1 10*3/uL (ref 0.0–0.5)
Eosinophils Relative: 2 %
HCT: 25.2 % — ABNORMAL LOW (ref 36.0–46.0)
Hemoglobin: 7.7 g/dL — ABNORMAL LOW (ref 12.0–15.0)
Immature Granulocytes: 1 %
Lymphocytes Relative: 10 %
Lymphs Abs: 0.9 10*3/uL (ref 0.7–4.0)
MCH: 29.4 pg (ref 26.0–34.0)
MCHC: 30.6 g/dL (ref 30.0–36.0)
MCV: 96.2 fL (ref 80.0–100.0)
Monocytes Absolute: 1.1 10*3/uL — ABNORMAL HIGH (ref 0.1–1.0)
Monocytes Relative: 12 %
Neutro Abs: 6.8 10*3/uL (ref 1.7–7.7)
Neutrophils Relative %: 75 %
Platelets: 386 10*3/uL (ref 150–400)
RBC: 2.62 MIL/uL — ABNORMAL LOW (ref 3.87–5.11)
RDW: 15.9 % — ABNORMAL HIGH (ref 11.5–15.5)
WBC: 8.9 10*3/uL (ref 4.0–10.5)
nRBC: 0 % (ref 0.0–0.2)

## 2022-02-11 NOTE — Progress Notes (Signed)
OT Cancellation Note  Patient Details Name: LAURELAI LEPP MRN: [id number] DOB: [birth year]   Cancelled Treatment:    Reason Eval/Treat Not Completed: OT screened, no needs identified, will sign off. Pt able to demonstrate all ADLs and functional mobility at her baseline. Agrees for OT to sign off with goal of going home today.   Julien Girt 02/11/2022, 9:32 AM

## 2022-02-11 NOTE — TOC Progression Note (Signed)
Transition of Care [facility]) - Progression Note    Patient Details  Name: Janet Mccarthy MRN: [id number] Date of Birth: Dec 02, 1934  Transition of [facility] CM/SW Contact  Leeroy Cha, RN Phone Number: 02/11/2022, 8:21 AM  Clinical Narrative:    [phone] chart reviewed.  Following for toc needs.  Plan is to return home with self-care at this time.   Expected Discharge Plan: Home/Self Care Barriers to Discharge: Continued Medical Work up  Expected Discharge Plan and Services Expected Discharge Plan: Home/Self Care   Discharge Planning Services: CM Consult   Living arrangements for the past 2 months: Single Family Home                                       Social Determinants of Health (SDOH) Interventions    Readmission Risk Interventions     No data to display

## 2022-02-11 NOTE — Progress Notes (Signed)
Patient reports a bleeding hemorrhoid and tear for a few weeks. She has episodes where discomfort and bleeding are worse and  Wishes to have prescription for relief once discharged. Wanted MD to be made aware of hemorrhoid and discomfort of rectum. Will monitor and pass on to nurse in the morning.  Provided education regarding hygiene, proper cleansing/wiping after urination and bowel movements, not using perfumed vaginal products, symptoms of UTI to watch for, cause of UTI, and bacteria associated with UTI.

## 2022-02-11 NOTE — Progress Notes (Addendum)
[facility] PROGRESS NOTE    Progress Note  Janet Mccarthy  JEH:[id number] DOB: Jul 14, 1935 DOA: 02/07/2022 PCP: Crist Infante, MD     Brief Narrative:   Janet Mccarthy is an 87 y.o. female past medical history of hypothyroidism, essential hypertension, chronic kidney stage IIIa comes into the hospital with fever and chills she was recently discharged about 10 days prior to admission secondary to sepsis due to UTI was found to have E. coli bacteremia treated with Rocephin and discharged on oral antibiotics.  4 days after discharge she start experiencing fevers again Assessment/Plan:   Sepsis secondary to acute pyelonephritis which has failed outpatient oral antibiotic treatment: Last blood culture and urine culture grew E. coli resistant to penicillin.  She failed oral cefadroxil as an outpatient. CT scan of the abdomen pelvis was done that showed no abscess. Tmax of 100.4. Surveillance blood cultures on 02/07/2022 have remained negative till date. ID agreed with IV Rocephin.  Normocytic anemia: Follow-up with PCP as an outpatient.  Chronic kidney stage IIIa: Creatinine appears to be at baseline.  Essential hypertension: Continue to hold antihypertensive medication.  Hypothyroidism Continue Synthroid.  Incidental 4 mm left upper lobe lung nodule: To follow-up with PCP 4 to 6 weeks.   DVT prophylaxis: loveno Family Communication:husband Status is: Inpatient Remains inpatient appropriate because: Sepsis due to to acute acute pyelonephritis which failed oral antibiotic    Code Status:     Code Status Orders  (From admission, onward)           Start     Ordered   02/08/22 1620  Full code  Continuous        02/08/22 1623           Code Status History     Date Active Date Inactive Code Status Order ID Comments User Context   01/27/2022 1612 01/31/2022 1731 Full Code [id number]  Antonieta Pert, MD ED   06/01/2021 1656 06/02/2021 2032 Full Code [id number]   Derl Barrow, PA Inpatient      Advance Directive Documentation    Flowsheet Row Most Recent Value  Type of Advance Directive Healthcare Power of Attorney  Pre-existing out of facility DNR order (yellow form or pink MOST form) --  "MOST" Form in Place? --         IV Access:   Peripheral IV   Procedures and diagnostic studies:   No results found.   Medical Consultants:   None.   Subjective:    Janet Mccarthy relates she feels much better than yesterday.  Objective:    Vitals:   02/10/22 2031 02/10/22 2140 02/10/22 2233 02/11/22 0457  BP: (!) 171/72 (!) 161/64  140/63  Pulse: (!) 105   100  Resp: 16   17  Temp: (!) 100.4 F (38 C)  99.3 F (37.4 C) 100.1 F (37.8 C)  TempSrc: Oral  Oral Oral  SpO2: 95%   91%  Weight:      Height:       SpO2: 91 %   Intake/Output Summary (Last 24 hours) at 02/11/2022 0823 Last data filed at 02/11/2022 0601 Gross per 24 hour  Intake 2755.07 ml  Output --  Net 2755.07 ml    Filed Weights   02/07/22 1758  Weight: 73.5 kg    Exam: General exam: In no acute distress. Respiratory system: Good air movement and clear to auscultation. Cardiovascular system: S1 & S2 heard, RRR. No JVD. Gastrointestinal system: Abdomen is nondistended, soft and  nontender.  Extremities: No pedal edema. Skin: No rashes, lesions or ulcers Psychiatry: Judgement and insight appear normal. Mood & affect appropriate. Data Reviewed:    Labs: Basic Metabolic Panel: Recent Labs  Lab 02/07/22 1845 02/10/22 0440  NA 135 137  K 3.7 4.2  CL 100 103  CO2 24 27  GLUCOSE 102* 118*  BUN 10 11  CREATININE 1.18* 1.18*  CALCIUM 10.1 9.6  MG  --  1.8    GFR Estimated Creatinine Clearance: 35.8 mL/min (A) (by C-G formula based on SCr of 1.18 mg/dL (H)). Liver Function Tests: Recent Labs  Lab 02/07/22 1845 02/10/22 0440  AST 8* 10*  ALT 7 10  ALKPHOS 98 95  BILITOT 0.6 0.5  PROT 7.0 6.1*  ALBUMIN 3.6 2.5*    No  results for input(s): "LIPASE", "AMYLASE" in the last 168 hours. No results for input(s): "AMMONIA" in the last 168 hours. Coagulation profile Recent Labs  Lab 02/09/22 0421  INR 1.2    COVID-19 Labs  Recent Labs    02/09/22 1518  CRP 17.4*     Lab Results  Component Value Date   SARSCOV2NAA NEGATIVE 02/07/2022   SARSCOV2NAA NEGATIVE 01/27/2022   SARSCOV2NAA RESULT: NEGATIVE 05/28/2021    CBC: Recent Labs  Lab 02/07/22 1845 02/10/22 0440  WBC 13.2* 13.3*  NEUTROABS 10.7*  --   HGB 9.2* 8.0*  HCT 28.9* 26.5*  MCV 94.1 97.1  PLT 470* 406*    Cardiac Enzymes: No results for input(s): "CKTOTAL", "CKMB", "CKMBINDEX", "TROPONINI" in the last 168 hours. BNP (last 3 results) No results for input(s): "PROBNP" in the last 8760 hours. CBG: No results for input(s): "GLUCAP" in the last 168 hours. D-Dimer: No results for input(s): "DDIMER" in the last 72 hours. Hgb A1c: No results for input(s): "HGBA1C" in the last 72 hours. Lipid Profile: No results for input(s): "CHOL", "HDL", "LDLCALC", "TRIG", "CHOLHDL", "LDLDIRECT" in the last 72 hours. Thyroid function studies: No results for input(s): "TSH", "T4TOTAL", "T3FREE", "THYROIDAB" in the last 72 hours.  Invalid input(s): "FREET3" Anemia work up: No results for input(s): "VITAMINB12", "FOLATE", "FERRITIN", "TIBC", "IRON", "RETICCTPCT" in the last 72 hours. Sepsis Labs: Recent Labs  Lab 02/07/22 1845 02/07/22 2012 02/09/22 0421 02/10/22 0440  PROCALCITON  --   --  0.47  --   WBC 13.2*  --   --  13.3*  LATICACIDVEN 0.9 0.9  --   --     Microbiology Recent Results (from the past 240 hour(s))  Resp Panel by RT-PCR (Flu A&B, Covid) Anterior Nasal Swab     Status: None   Collection Time: 02/07/22  6:27 PM   Specimen: Anterior Nasal Swab  Result Value Ref Range Status   SARS Coronavirus 2 by RT PCR NEGATIVE NEGATIVE Final    Comment: (NOTE) SARS-CoV-2 target nucleic acids are NOT DETECTED.  The SARS-CoV-2 RNA  is generally detectable in upper respiratory specimens during the acute phase of infection. The lowest concentration of SARS-CoV-2 viral copies this assay can detect is 138 copies/mL. A negative result does not preclude SARS-Cov-2 infection and should not be used as the sole basis for treatment or other patient management decisions. A negative result may occur with  improper specimen collection/handling, submission of specimen other than nasopharyngeal swab, presence of viral mutation(s) within the areas targeted by this assay, and inadequate number of viral copies(<138 copies/mL). A negative result must be combined with clinical observations, patient history, and epidemiological information. The expected result is Negative.  Fact Sheet for  Patients:  EntrepreneurPulse.com.au  Fact Sheet for Healthcare Providers:  IncredibleEmployment.be  This test is no t yet approved or cleared by the [place] FDA and  has been authorized for detection and/or diagnosis of SARS-CoV-2 by FDA under an Emergency Use Authorization (EUA). This EUA will remain  in effect (meaning this test can be used) for the duration of the COVID-19 declaration under Section 564(b)(1) of the Act, 21 U.S.C.section 360bbb-3(b)(1), unless the authorization is terminated  or revoked sooner.       Influenza A by PCR NEGATIVE NEGATIVE Final   Influenza B by PCR NEGATIVE NEGATIVE Final    Comment: (NOTE) The Xpert Xpress SARS-CoV-2/FLU/RSV plus assay is intended as an aid in the diagnosis of influenza from Nasopharyngeal swab specimens and should not be used as a sole basis for treatment. Nasal washings and aspirates are unacceptable for Xpert Xpress SARS-CoV-2/FLU/RSV testing.  Fact Sheet for Patients: EntrepreneurPulse.com.au  Fact Sheet for Healthcare Providers: IncredibleEmployment.be  This test is not yet approved or cleared by the  [place] FDA and has been authorized for detection and/or diagnosis of SARS-CoV-2 by FDA under an Emergency Use Authorization (EUA). This EUA will remain in effect (meaning this test can be used) for the duration of the COVID-19 declaration under Section 564(b)(1) of the Act, 21 U.S.C. section 360bbb-3(b)(1), unless the authorization is terminated or revoked.  Performed at [facility], [address], [place], [place] [address]   Culture, blood (routine x 2)     Status: None (Preliminary result)   Collection Time: 02/07/22  6:27 PM   Specimen: BLOOD  Result Value Ref Range Status   Specimen Description   Final    BLOOD RIGHT ANTECUBITAL Performed at [facility], [address], [place], [place] [address]    Special Requests   Final    BOTTLES DRAWN AEROBIC AND ANAEROBIC Blood Culture adequate volume Performed at [facility], [address], [place], [place] [address]    Culture   Final    NO GROWTH 4 DAYS Performed at [facility], [place] [address], [place], [place] [address]    Report Status PENDING  Incomplete  Culture, blood (routine x 2)     Status: None (Preliminary result)   Collection Time: 02/07/22  6:27 PM   Specimen: BLOOD  Result Value Ref Range Status   Specimen Description   Final    BLOOD LEFT ANTECUBITAL Performed at [facility], [address], [place], [place] [address]    Special Requests   Final    BOTTLES DRAWN AEROBIC AND ANAEROBIC Blood Culture adequate volume Performed at [facility], [address], [place], [place] [address]    Culture   Final    NO GROWTH 4 DAYS Performed at [facility], [place] [address] [place], [place] [address]    Report Status PENDING  Incomplete  Urine Culture     Status: None   Collection Time: 02/07/22  8:16 PM   Specimen: Urine, Clean Catch  Result Value Ref Range Status   Specimen Description    Final    URINE, CLEAN CATCH Performed at [facility], [address], [place], [place] [address]    Special Requests   Final    NONE Performed at [facility], [address], [place], [place] [address]    Culture   Final    NO GROWTH Performed at [facility], [place] [address], [place], [place] [address]  Report Status 02/09/2022 FINAL  Final     Medications:    amitriptyline  50 mg Oral QHS   enoxaparin (LOVENOX) injection  40 mg Subcutaneous Q24H   levothyroxine  75 mcg Oral QAC breakfast   pantoprazole  40 mg Oral q morning   sodium chloride  1 spray Each Nare Daily   Continuous Infusions:  cefTRIAXone (ROCEPHIN)  IV Stopped (02/10/22 0940)   lactated ringers 100 mL/hr at 02/11/22 0601      LOS: 3 days   Charlynne Cousins  [facility]  02/11/2022, 8:23 AM

## 2022-02-11 NOTE — Evaluation (Signed)
Physical Therapy Evaluation Patient Details Name: Janet Mccarthy MRN: [id number] DOB: May 09, 1935 Today's Date: 02/11/2022  History of Present Illness  87 yo female admitted sepsis 2* pyelonephritis. Pt with hx of recent R hip stress fx, R post THA 2004, dysphagia, CKD, L THA DA 2022  Clinical Impression  Pt admitted as above and presenting with functional mobility limitations 2* mild balance deficits and R LE discomfort with ambulation.  Pt should progress to dc home with family assist.    Recommendations for follow up therapy are one component of a multi-disciplinary discharge planning process, led by the attending physician.  Recommendations may be updated based on patient status, additional functional criteria and insurance authorization.  Follow Up Recommendations No PT follow up      Assistance Recommended at Discharge PRN  Patient can return home with the following  Assist for transportation;Assistance with cooking/housework    Equipment Recommendations None recommended by PT  Recommendations for Other Services       Functional Status Assessment Patient has had a recent decline in their functional status and demonstrates the ability to make significant improvements in function in a reasonable and predictable amount of time.     Precautions / Restrictions Precautions Precautions: None Restrictions Weight Bearing Restrictions: No      Mobility  Bed Mobility Overal bed mobility: Modified Independent             General bed mobility comments: no physical assist    Transfers Overall transfer level: Needs assistance Equipment used: None Transfers: Sit to/from Stand Sit to Stand: Modified independent (Device/Increase time)           General transfer comment: no physical assist    Ambulation/Gait Ambulation/Gait assistance: Modified independent (Device/Increase time) Gait Distance (Feet): 300 Feet Assistive device: None, Straight cane Gait  Pattern/deviations: Step-through pattern, Antalgic, Decreased step length - right, Decreased step length - left, Decreased stance time - right, Shuffle       General Gait Details: Pt initially insists on gait sans AD but with noted antalgic gait, offered SPC and with noted improvement.  Pt volunteers that she does use her rollator in the house and her cane when she goes out  Financial trader Rankin (Stroke Patients Only)       Balance Overall balance assessment: Mild deficits observed, not formally tested                                           Pertinent Vitals/Pain Pain Assessment Pain Assessment: No/denies pain    Home Living Family/patient expects to be discharged to:: Private residence Living Arrangements: Spouse/significant other Available Help at Discharge: Family Type of Home: House Home Access: Stairs to enter   [employer] of Steps: 2 - garage rail/ porch 3 steps with one [place]: One level;Able to live on main level with bedroom/bathroom Home Equipment: Rolling Walker (2 wheels);Rollator (4 wheels);Cane - single point      Prior Function Prior Level of Function : Independent/Modified Independent             Mobility Comments: using RW vs cane  d/t stress fx R--femur ADLs Comments: independent     Hand Dominance        Extremity/Trunk Assessment   Upper Extremity Assessment Upper Extremity Assessment:  Overall [facility] for tasks assessed    Lower Extremity Assessment Lower Extremity Assessment: Overall WFL for tasks assessed    Cervical / Trunk Assessment Cervical / Trunk Assessment: Normal  Communication   Communication: No difficulties  Cognition Arousal/Alertness: Awake/alert Behavior During Therapy: WFL for tasks assessed/performed Overall Cognitive Status: Within Functional Limits for tasks assessed                                           General Comments      Exercises     Assessment/Plan    PT Assessment Patient needs continued PT services  PT Problem List Decreased strength;Decreased mobility;Decreased activity tolerance;Decreased balance;Decreased knowledge of use of DME       PT Treatment Interventions DME instruction;Gait training;Therapeutic activities;Therapeutic exercise;Patient/family education;Balance training;Functional mobility training    PT Goals (Current goals can be found in the Care Plan section)  Acute Rehab PT Goals Patient Stated Goal: home soon PT Goal Formulation: With patient Time For Goal Achievement: 02/25/22 Potential to Achieve Goals: Good    Frequency Min 3X/week     Co-evaluation               AM-PAC PT "6 Clicks" Mobility  Outcome Measure Help needed turning from your back to your side while in a flat bed without using bedrails?: None Help needed moving from lying on your back to sitting on the side of a flat bed without using bedrails?: None Help needed moving to and from a bed to a chair (including a wheelchair)?: None Help needed standing up from a chair using your arms (e.g., wheelchair or bedside chair)?: None Help needed to walk in hospital room?: A Little Help needed climbing 3-5 steps with a railing? : A Little 6 Click Score: 22    End of Session Equipment Utilized During Treatment: Gait belt Activity Tolerance: Patient tolerated treatment well Patient left: in chair;with call bell/phone within reach;with family/visitor present Nurse Communication: Mobility status PT Visit Diagnosis: Difficulty in walking, not elsewhere classified (R26.2)    Time: 1202-1217 PT Time Calculation (min) (ACUTE ONLY): 15 min   Charges:   PT Evaluation $PT Eval Low Complexity: 1 Low          Debe Coder PT Acute Rehabilitation Services Pager [phone] Office [phone]   Jessie Cowher 02/11/2022, 3:56 PM

## 2022-02-11 NOTE — Progress Notes (Addendum)
[facility] Infectious Diseases Follow Up Note  Patient Identification: Patient Name: Janet Mccarthy MRN: [id number] [place] Date: 02/07/2022  5:51 PM Age: 87 y.o.Today's Date: 02/11/2022  Reason for Visit: Pyelonephritis/fevers   Principal Problem:   Acute pyelonephritis Active Problems:   Hypothyroidism   GERD (gastroesophageal reflux disease)   Stage 3a chronic kidney disease (CKD) (HCC)   HTN (hypertension)   Normocytic anemia  Antibiotics:  Ceftriaxone 7/9-c   Lines/Hardware: Rt and left hip arthroplasty   Interval Events: fever curve is trending down, leukocytosis has resolved. Negative blood cultures   Assessment Fevers : likely related to E coli Pyelonephritis/bacteremia, failed tx with OP cefadroxil  Nausea/vomiting - improved Leukocytosis - resolved  Probable gum bleeding - mentioned tooth paste turning to light pink while brushinh. Oral exam unremarkable. Advised to use tooth brush with soft bristles and fu with dentist.   Recommendations Continue ceftriaxone for now Would prefer her to be afebrile for 48 hrs prior to considering to switch to PO antibiotics given recent failed OP tx Plan discussed with patient/family/ID pharmacy   Rest of the management as per the primary team. Thank you for the consult. Please page with pertinent questions or concerns.  ______________________________________________________________________ Subjective patient seen and examined at the bedside.  Feels better Denies nausea, vomiting and diarrhea   Vitals BP 140/63 (BP Location: Right Arm)   Pulse 100   Temp 100.1 F (37.8 C) (Oral)   Resp 17   Ht '5\' 6"'$  (1.676 m)   Wt 73.5 kg   SpO2 91%   BMI 26.15 kg/m     Physical Exam Constitutional:  NAS, sitting up in the bed, Husband at bedside    Comments:   Cardiovascular:     Rate and Rhythm: Normal rate and regular rhythm.     Heart sounds:   Pulmonary:      Effort: Pulmonary effort is normal on RA    Comments:   Abdominal:     Palpations: Abdomen is soft.     Tenderness: non distended and non tender   Musculoskeletal:        General: No swelling or tenderness.   Skin:    Comments: No obvious lesions or rashes   Neurological:     General: Grossly non focal, awake, alert and oriented   Psychiatric:        Mood and Affect: Mood normal.   Pertinent Microbiology Results for orders placed or performed during the hospital encounter of 02/07/22  Resp Panel by RT-PCR (Flu A&B, Covid) Anterior Nasal Swab     Status: None   Collection Time: 02/07/22  6:27 PM   Specimen: Anterior Nasal Swab  Result Value Ref Range Status   SARS Coronavirus 2 by RT PCR NEGATIVE NEGATIVE Final    Comment: (NOTE) SARS-CoV-2 target nucleic acids are NOT DETECTED.  The SARS-CoV-2 RNA is generally detectable in upper respiratory specimens during the acute phase of infection. The lowest concentration of SARS-CoV-2 viral copies this assay can detect is 138 copies/mL. A negative result does not preclude SARS-Cov-2 infection and should not be used as the sole basis for treatment or other patient management decisions. A negative result may occur with  improper specimen collection/handling, submission of specimen other than nasopharyngeal swab, presence of viral mutation(s) within the areas targeted by this assay, and inadequate number of viral copies(<138 copies/mL). A negative result must be combined with clinical observations, patient history, and epidemiological information. The expected result is Negative.  Fact Sheet for Patients:  EntrepreneurPulse.com.au  Fact Sheet for Healthcare Providers:  IncredibleEmployment.be  This test is no t yet approved or cleared by the [place] FDA and  has been authorized for detection and/or diagnosis of SARS-CoV-2 by FDA under an Emergency Use Authorization (EUA). This EUA will  remain  in effect (meaning this test can be used) for the duration of the COVID-19 declaration under Section 564(b)(1) of the Act, 21 U.S.C.section 360bbb-3(b)(1), unless the authorization is terminated  or revoked sooner.       Influenza A by PCR NEGATIVE NEGATIVE Final   Influenza B by PCR NEGATIVE NEGATIVE Final    Comment: (NOTE) The Xpert Xpress SARS-CoV-2/FLU/RSV plus assay is intended as an aid in the diagnosis of influenza from Nasopharyngeal swab specimens and should not be used as a sole basis for treatment. Nasal washings and aspirates are unacceptable for Xpert Xpress SARS-CoV-2/FLU/RSV testing.  Fact Sheet for Patients: EntrepreneurPulse.com.au  Fact Sheet for Healthcare Providers: IncredibleEmployment.be  This test is not yet approved or cleared by the [place] FDA and has been authorized for detection and/or diagnosis of SARS-CoV-2 by FDA under an Emergency Use Authorization (EUA). This EUA will remain in effect (meaning this test can be used) for the duration of the COVID-19 declaration under Section 564(b)(1) of the Act, 21 U.S.C. section 360bbb-3(b)(1), unless the authorization is terminated or revoked.  Performed at [facility], [address] [place], [place] [address]   Culture, blood (routine x 2)     Status: None (Preliminary result)   Collection Time: 02/07/22  6:27 PM   Specimen: BLOOD  Result Value Ref Range Status   Specimen Description   Final    BLOOD RIGHT ANTECUBITAL Performed at [facility], [address] [place], [place] [address]    Special Requests   Final    BOTTLES DRAWN AEROBIC AND ANAEROBIC Blood Culture adequate volume Performed at [facility], [address] [place], [place] [address]    Culture   Final    NO GROWTH 4 DAYS Performed at [facility], [place] [address] [place], [place] [address]    Report Status PENDING   Incomplete  Culture, blood (routine x 2)     Status: None (Preliminary result)   Collection Time: 02/07/22  6:27 PM   Specimen: BLOOD  Result Value Ref Range Status   Specimen Description   Final    BLOOD LEFT ANTECUBITAL Performed at [facility], [address] [place], [place] [address]    Special Requests   Final    BOTTLES DRAWN AEROBIC AND ANAEROBIC Blood Culture adequate volume Performed at [facility], [address], [place], [place] [address]    Culture   Final    NO GROWTH 4 DAYS Performed at [facility], [place] [address], [place], [place] [address]    Report Status PENDING  Incomplete  Urine Culture     Status: None   Collection Time: 02/07/22  8:16 PM   Specimen: Urine, Clean Catch  Result Value Ref Range Status   Specimen Description   Final    URINE, CLEAN CATCH Performed at [facility], [address] [place], [place] [address]    Special Requests   Final    NONE Performed at [facility], [address] [place], [place] [address]    Culture   Final    NO GROWTH Performed at [facility], [place] [address], [place], [place] [address]  Report Status 02/09/2022 FINAL  Final    Pertinent Lab.    Latest Ref Rng & Units 02/11/2022    8:50 AM 02/10/2022    4:40 AM 02/07/2022    6:45 PM  CBC  WBC 4.0 - 10.5 K/uL 8.9  13.3  13.2   Hemoglobin 12.0 - 15.0 g/dL 7.7  8.0  9.2   Hematocrit 36.0 - 46.0 % 25.2  26.5  28.9   Platelets 150 - 400 K/uL 386  406  470       Latest Ref Rng & Units 02/10/2022    4:40 AM 02/07/2022    6:45 PM 01/31/2022    5:36 AM  CMP  Glucose 70 - 99 mg/dL 118  102  105   BUN 8 - 23 mg/dL '11  10  17   '$ Creatinine 0.44 - 1.00 mg/dL 1.18  1.18  1.21   Sodium 135 - 145 mmol/L 137  135  134   Potassium 3.5 - 5.1 mmol/L 4.2  3.7  3.8   Chloride 98 - 111 mmol/L 103  100  102   CO2 22 - 32 mmol/L '27  24  25   '$ Calcium 8.9 - 10.3 mg/dL 9.6  10.1  9.6    Total Protein 6.5 - 8.1 g/dL 6.1  7.0    Total Bilirubin 0.3 - 1.2 mg/dL 0.5  0.6    Alkaline Phos 38 - 126 U/L 95  98    AST 15 - 41 U/L 10  8    ALT 0 - 44 U/L 10  7       Pertinent Imaging today Plain films and CT images have been personally visualized and interpreted; radiology reports have been reviewed. Decision making incorporated into the Impression / Recommendations.  No results found.   I spent 36 minutes for this patient encounter including review of prior medical records, coordination of care with primary/other specialist with greater than 50% of time being face to face/counseling and discussing diagnostics/treatment plan with the patient/family.  Electronically signed by:   Rosiland Oz, MD Infectious Disease Physician [facility] for Infectious Disease Pager: [phone]

## 2022-02-12 DIAGNOSIS — N10 Acute tubulo-interstitial nephritis: Secondary | ICD-10-CM | POA: Diagnosis not present

## 2022-02-12 LAB — RETICULOCYTES
Immature Retic Fract: 8.4 % (ref 2.3–15.9)
RBC.: 2.93 MIL/uL — ABNORMAL LOW (ref 3.87–5.11)
Retic Count, Absolute: 27.8 10*3/uL (ref 19.0–186.0)
Retic Ct Pct: 1 % (ref 0.4–3.1)

## 2022-02-12 LAB — CULTURE, BLOOD (ROUTINE X 2)
Culture: NO GROWTH
Culture: NO GROWTH
Special Requests: ADEQUATE
Special Requests: ADEQUATE

## 2022-02-12 LAB — IRON AND TIBC
Iron: 19 ug/dL — ABNORMAL LOW (ref 28–170)
Saturation Ratios: 10 % — ABNORMAL LOW (ref 10.4–31.8)
TIBC: 191 ug/dL — ABNORMAL LOW (ref 250–450)
UIBC: 172 ug/dL

## 2022-02-12 LAB — VITAMIN B12: Vitamin B-12: 334 pg/mL (ref 180–914)

## 2022-02-12 LAB — FERRITIN: Ferritin: 346 ng/mL — ABNORMAL HIGH (ref 11–307)

## 2022-02-12 LAB — FOLATE: Folate: 29.1 ng/mL (ref >5.9)

## 2022-02-12 MED ORDER — FERROUS SULFATE 325 (65 FE) MG PO TABS
325.0000 mg | ORAL_TABLET | Freq: Every day | ORAL | Status: DC
  Administered 2022-02-13 – 2022-02-15 (×3): 325 mg via ORAL
  Filled 2022-02-12 (×3): qty 1

## 2022-02-12 NOTE — Progress Notes (Signed)
Physical Therapy Treatment Patient Details Name: Janet Mccarthy MRN: [id number] DOB: 25-Aug-1934 Today's Date: 02/12/2022   History of Present Illness 87 yo female admitted sepsis 2* pyelonephritis. Pt with hx of recent R hip stress fx, R post THA 2004, dysphagia, CKD, L THA DA 2022    PT Comments    Pt ambulated 400' with a straight cane, no loss of balance, no pain, HR 120 walking, SpO2 93% on room air walking. PT goals have been met, will sign off. She is safe to ambulate in the halls independently.    Recommendations for follow up therapy are one component of a multi-disciplinary discharge planning process, led by the attending physician.  Recommendations may be updated based on patient status, additional functional criteria and insurance authorization.  Follow Up Recommendations  No PT follow up     Assistance Recommended at Discharge PRN  Patient can return home with the following Assist for transportation;Assistance with cooking/housework   Equipment Recommendations  None recommended by PT    Recommendations for Other Services       Precautions / Restrictions Precautions Precautions: Fall Precaution Comments: pt denies falls in past 6 months Restrictions Weight Bearing Restrictions: No     Mobility  Bed Mobility Overal bed mobility: Modified Independent             General bed mobility comments: no physical assist, HOB up, used rail    Transfers Overall transfer level: Independent Equipment used: None Transfers: Sit to/from Stand Sit to Stand: Independent           General transfer comment: no physical assist    Ambulation/Gait Ambulation/Gait assistance: Modified independent (Device/Increase time) Gait Distance (Feet): 400 Feet Assistive device: Straight cane Gait Pattern/deviations: Decreased stride length, Step-through pattern Gait velocity: WNL     General Gait Details: steady with SPC, no loss of balance, HR 120, SpO2 93% on room  air, 2/4 dyspnea   Stairs             Wheelchair Mobility    Modified Rankin (Stroke Patients Only)       Balance Overall balance assessment: Modified Independent                                          Cognition Arousal/Alertness: Awake/alert Behavior During Therapy: WFL for tasks assessed/performed Overall Cognitive Status: Within Functional Limits for tasks assessed                                          Exercises      General Comments        Pertinent Vitals/Pain Pain Assessment Pain Assessment: No/denies pain    Home Living                          Prior Function            PT Goals (current goals can now be found in the care plan section) Acute Rehab PT Goals Patient Stated Goal: home soon, likes to read PT Goal Formulation: With patient Time For Goal Achievement: 02/25/22 Potential to Achieve Goals: Good Progress towards PT goals: Goals met/education completed, patient discharged from PT    Frequency    Min 3X/week      PT Plan  Current plan remains appropriate    Co-evaluation              AM-PAC PT "6 Clicks" Mobility   Outcome Measure  Help needed turning from your back to your side while in a flat bed without using bedrails?: None Help needed moving from lying on your back to sitting on the side of a flat bed without using bedrails?: None Help needed moving to and from a bed to a chair (including a wheelchair)?: None Help needed standing up from a chair using your arms (e.g., wheelchair or bedside chair)?: None Help needed to walk in hospital room?: None Help needed climbing 3-5 steps with a railing? : None 6 Click Score: 24    End of Session Equipment Utilized During Treatment: Gait belt Activity Tolerance: Patient tolerated treatment well Patient left: with call bell/phone within reach;in bed;with bed alarm set Nurse Communication: Mobility status PT Visit Diagnosis:  Difficulty in walking, not elsewhere classified (R26.2)     Time: 3612-2449 PT Time Calculation (min) (ACUTE ONLY): 10 min  Charges:  $Gait Training: 8-22 mins                    Blondell Reveal Kistler PT 02/12/2022  Page  Office [phone]

## 2022-02-12 NOTE — Progress Notes (Signed)
ID Brief Note   Remains afebrile, no leukocytosis No new concerns noted per chart review   If remains afebrile with no new concerns, can de-escalate to 5 more days of ciprofloxacin  tomorrow to complete 10 days of tx ( IV ceftriaxone and PO Ciprofloxacin). EOT 02/17/22  ]D/W ID pharmacy ID available as needed, recall if needed  Rosiland Oz, MD Infectious Disease Physician [facility] Infect[facility] [address] [place], [place] [address] Phone: [phone]  Fax: [phone]

## 2022-02-12 NOTE — Progress Notes (Signed)
[facility] PROGRESS NOTE    Progress Note  SHALANA JARDIN  KKX:[id number] DOB: 01/26/1935 DOA: 02/07/2022 PCP: Crist Infante, MD     Brief Narrative:   RAYNE COWDREY is an 87 y.o. female past medical history of hypothyroidism, essential hypertension, chronic kidney stage IIIa comes into the hospital with fever and chills she was recently discharged about 10 days prior to admission secondary to sepsis due to UTI was found to have E. coli bacteremia treated with Rocephin and discharged on oral antibiotics.  4 days after discharge she start experiencing fevers again Assessment/Plan:   Sepsis secondary to acute pyelonephritis which has failed outpatient oral antibiotic treatment: Last blood culture and urine culture grew E. coli resistant to penicillin and sensitive to cephalosporins, she failed oral cefadroxil as an outpatient. CT scan of the abdomen pelvis was done that showed no abscess. Tmax of 99.3. Surveillance blood cultures and urine cultures remain negative. ID was consulted they recommended to continue IV Rocephin for 48 hours.  Normocytic anemia: Follow-up with PCP as an outpatient. Check an RBC folate and B12 started on iron therapy.  Chronic kidney stage IIIa: Creatinine appears to be at baseline.  Essential hypertension: Continue to hold antihypertensive medication.  Hypothyroidism Continue Synthroid.  Incidental 4 mm left upper lobe lung nodule: To follow-up with PCP 4 to 6 weeks.   DVT prophylaxis: loveno Family Communication:husband Status is: Inpatient Remains inpatient appropriate because: Sepsis due to to acute acute pyelonephritis which failed oral antibiotic    Code Status:     Code Status Orders  (From admission, onward)           Start     Ordered   02/08/22 1620  Full code  Continuous        02/08/22 1623           Code Status History     Date Active Date Inactive Code Status Order ID Comments User Context    01/27/2022 1612 01/31/2022 1731 Full Code [id number]  Antonieta Pert, MD ED   06/01/2021 1656 06/02/2021 2032 Full Code [id number]  Derl Barrow, PA Inpatient      Advance Directive Documentation    Flowsheet Row Most Recent Value  Type of Advance Directive Healthcare Power of Attorney  Pre-existing out of facility DNR order (yellow form or pink MOST form) --  "MOST" Form in Place? --         IV Access:   Peripheral IV   Procedures and diagnostic studies:   No results found.   Medical Consultants:   None.   Subjective:    TROYCE FEBO relates she feels better than yesterday but does not like the diet in the hospital.  Objective:    Vitals:   02/11/22 0457 02/11/22 1347 02/11/22 1951 02/12/22 0544  BP: 140/63 140/60 (!) 174/74 (!) 144/65  Pulse: 100 81 (!) 101 93  Resp: '17 16 17 18  '$ Temp: 100.1 F (37.8 C) 98.6 F (37 C) 99.3 F (37.4 C) 99.1 F (37.3 C)  TempSrc: Oral Oral Oral Oral  SpO2: 91% 97% 97% 94%  Weight:      Height:       SpO2: 94 %   Intake/Output Summary (Last 24 hours) at 02/12/2022 0852 Last data filed at 02/11/2022 1734 Gross per 24 hour  Intake 493.57 ml  Output --  Net 493.57 ml    Filed Weights   02/07/22 1758  Weight: 73.5 kg    Exam: General exam: In  no acute distress. Respiratory system: Good air movement and clear to auscultation. Cardiovascular system: S1 & S2 heard, RRR. No JVD. Gastrointestinal system: Abdomen is nondistended, soft and nontender.  Extremities: No pedal edema. Skin: No rashes, lesions or ulcers Psychiatry: Judgement and insight appear normal. Mood & affect appropriate. Data Reviewed:    Labs: Basic Metabolic Panel: Recent Labs  Lab 02/07/22 1845 02/10/22 0440  NA 135 137  K 3.7 4.2  CL 100 103  CO2 24 27  GLUCOSE 102* 118*  BUN 10 11  CREATININE 1.18* 1.18*  CALCIUM 10.1 9.6  MG  --  1.8    GFR Estimated Creatinine Clearance: 35.8 mL/min (A) (by C-G formula based on SCr of  1.18 mg/dL (H)). Liver Function Tests: Recent Labs  Lab 02/07/22 1845 02/10/22 0440  AST 8* 10*  ALT 7 10  ALKPHOS 98 95  BILITOT 0.6 0.5  PROT 7.0 6.1*  ALBUMIN 3.6 2.5*    No results for input(s): "LIPASE", "AMYLASE" in the last 168 hours. No results for input(s): "AMMONIA" in the last 168 hours. Coagulation profile Recent Labs  Lab 02/09/22 0421  INR 1.2    COVID-19 Labs  Recent Labs    02/09/22 1518  CRP 17.4*     Lab Results  Component Value Date   SARSCOV2NAA NEGATIVE 02/07/2022   SARSCOV2NAA NEGATIVE 01/27/2022   SARSCOV2NAA RESULT: NEGATIVE 05/28/2021    CBC: Recent Labs  Lab 02/07/22 1845 02/10/22 0440 02/11/22 0850  WBC 13.2* 13.3* 8.9  NEUTROABS 10.7*  --  6.8  HGB 9.2* 8.0* 7.7*  HCT 28.9* 26.5* 25.2*  MCV 94.1 97.1 96.2  PLT 470* 406* 386    Cardiac Enzymes: No results for input(s): "CKTOTAL", "CKMB", "CKMBINDEX", "TROPONINI" in the last 168 hours. BNP (last 3 results) No results for input(s): "PROBNP" in the last 8760 hours. CBG: No results for input(s): "GLUCAP" in the last 168 hours. D-Dimer: No results for input(s): "DDIMER" in the last 72 hours. Hgb A1c: No results for input(s): "HGBA1C" in the last 72 hours. Lipid Profile: No results for input(s): "CHOL", "HDL", "LDLCALC", "TRIG", "CHOLHDL", "LDLDIRECT" in the last 72 hours. Thyroid function studies: No results for input(s): "TSH", "T4TOTAL", "T3FREE", "THYROIDAB" in the last 72 hours.  Invalid input(s): "FREET3" Anemia work up: No results for input(s): "VITAMINB12", "FOLATE", "FERRITIN", "TIBC", "IRON", "RETICCTPCT" in the last 72 hours. Sepsis Labs: Recent Labs  Lab 02/07/22 1845 02/07/22 2012 02/09/22 0421 02/10/22 0440 02/11/22 0850  PROCALCITON  --   --  0.47  --   --   WBC 13.2*  --   --  13.3* 8.9  LATICACIDVEN 0.9 0.9  --   --   --     Microbiology Recent Results (from the past 240 hour(s))  Resp Panel by RT-PCR (Flu A&B, Covid) Anterior Nasal Swab      Status: None   Collection Time: 02/07/22  6:27 PM   Specimen: Anterior Nasal Swab  Result Value Ref Range Status   SARS Coronavirus 2 by RT PCR NEGATIVE NEGATIVE Final    Comment: (NOTE) SARS-CoV-2 target nucleic acids are NOT DETECTED.  The SARS-CoV-2 RNA is generally detectable in upper respiratory specimens during the acute phase of infection. The lowest concentration of SARS-CoV-2 viral copies this assay can detect is 138 copies/mL. A negative result does not preclude SARS-Cov-2 infection and should not be used as the sole basis for treatment or other patient management decisions. A negative result may occur with  improper specimen collection/handling, submission of specimen  other than nasopharyngeal swab, presence of viral mutation(s) within the areas targeted by this assay, and inadequate number of viral copies(<138 copies/mL). A negative result must be combined with clinical observations, patient history, and epidemiological information. The expected result is Negative.  Fact Sheet for Patients:  EntrepreneurPulse.com.au  Fact Sheet for Healthcare Providers:  IncredibleEmployment.be  This test is no t yet approved or cleared by the [place] FDA and  has been authorized for detection and/or diagnosis of SARS-CoV-2 by FDA under an Emergency Use Authorization (EUA). This EUA will remain  in effect (meaning this test can be used) for the duration of the COVID-19 declaration under Section 564(b)(1) of the Act, 21 U.S.C.section 360bbb-3(b)(1), unless the authorization is terminated  or revoked sooner.       Influenza A by PCR NEGATIVE NEGATIVE Final   Influenza B by PCR NEGATIVE NEGATIVE Final    Comment: (NOTE) The Xpert Xpress SARS-CoV-2/FLU/RSV plus assay is intended as an aid in the diagnosis of influenza from Nasopharyngeal swab specimens and should not be used as a sole basis for treatment. Nasal washings and aspirates are  unacceptable for Xpert Xpress SARS-CoV-2/FLU/RSV testing.  Fact Sheet for Patients: EntrepreneurPulse.com.au  Fact Sheet for Healthcare Providers: IncredibleEmployment.be  This test is not yet approved or cleared by the [place] FDA and has been authorized for detection and/or diagnosis of SARS-CoV-2 by FDA under an Emergency Use Authorization (EUA). This EUA will remain in effect (meaning this test can be used) for the duration of the COVID-19 declaration under Section 564(b)(1) of the Act, 21 U.S.C. section 360bbb-3(b)(1), unless the authorization is terminated or revoked.  Performed at [facility], [address] [place], [place] [address]   Culture, blood (routine x 2)     Status: None   Collection Time: 02/07/22  6:27 PM   Specimen: BLOOD  Result Value Ref Range Status   Specimen Description   Final    BLOOD RIGHT ANTECUBITAL Performed at [facility], [address], [place], [place] [address]    Special Requests   Final    BOTTLES DRAWN AEROBIC AND ANAEROBIC Blood Culture adequate volume Performed at [facility], [address] [place], [place] [address]    Culture   Final    NO GROWTH 5 DAYS Performed at [facility], [place] [address] [place], [place] [address]    Report Status 02/12/2022 FINAL  Final  Culture, blood (routine x 2)     Status: None   Collection Time: 02/07/22  6:27 PM   Specimen: BLOOD  Result Value Ref Range Status   Specimen Description   Final    BLOOD LEFT ANTECUBITAL Performed at [facility], [address], [place], [place] [address]    Special Requests   Final    BOTTLES DRAWN AEROBIC AND ANAEROBIC Blood Culture adequate volume Performed at [facility], [address] [place], [place] [address]    Culture   Final    NO GROWTH 5 DAYS Performed at [facility], [place] [address],  [place], [place] [address]    Report Status 02/12/2022 FINAL  Final  Urine Culture     Status: None   Collection Time: 02/07/22  8:16 PM   Specimen: Urine, Clean Catch  Result Value Ref Range Status   Specimen Description   Final    URINE, CLEAN CATCH Performed at [facility], [address], [place],  [address]    Special Requests  Final    NONE Performed at [facility], [address] [place], [place] [address]    Culture   Final    NO GROWTH Performed at [facility], [place] [address] [place], [place] [address]    Report Status 02/09/2022 FINAL  Final     Medications:    amitriptyline  50 mg Oral QHS   enoxaparin (LOVENOX) injection  40 mg Subcutaneous Q24H   levothyroxine  75 mcg Oral QAC breakfast   pantoprazole  40 mg Oral q morning   sodium chloride  1 spray Each Nare Daily   Continuous Infusions:  cefTRIAXone (ROCEPHIN)  IV 2 g (02/11/22 0823)   lactated ringers 10 mL/hr at 02/11/22 0900      LOS: 4 days   Charlynne Cousins  [facility]  02/12/2022, 8:52 AM

## 2022-02-13 DIAGNOSIS — N10 Acute tubulo-interstitial nephritis: Secondary | ICD-10-CM | POA: Diagnosis not present

## 2022-02-13 NOTE — Progress Notes (Signed)
[facility] PROGRESS NOTE    Progress Note  AOLANI PIGGOTT  PJA:[id number] DOB: 1935/06/23 DOA: 02/07/2022 PCP: Crist Infante, MD     Brief Narrative:   Janet Mccarthy is an 87 y.o. female past medical history of hypothyroidism, essential hypertension, chronic kidney stage IIIa comes into the hospital with fever and chills she was recently discharged about 10 days prior to admission secondary to sepsis due to UTI was found to have E. coli bacteremia treated with Rocephin and discharged on oral antibiotics.  4 days after discharge she start experiencing fevers again. Last blood culture and urine culture grew E. coli resistant to penicillin and sensitive to cephalosporins, she failed oral cefadroxil as an outpatient. CT scan of the abdomen pelvis was done that showed no abscess. Assessment/Plan:   Sepsis secondary to acute pyelonephritis which has failed outpatient oral antibiotic treatment: With a Tmax of 100.5.  Surveillance blood cultures have been negative till date. Surveillance blood cultures and urine cultures remain negative. ID was consulted recommended to they would prefer afebrile for 48 hours prior to discharge. Since she is spiking fevers we will continue IV empiric antibiotics. Continue to monitor fever curve.  Normocytic anemia: Follow-up with PCP as an outpatient. Check an RBC folate and B12 started on iron therapy.  Chronic kidney stage IIIa: Creatinine appears to be at baseline.  Essential hypertension: Continue to hold antihypertensive medication.  Hypothyroidism Continue Synthroid.  Incidental 4 mm left upper lobe lung nodule: To follow-up with PCP 4 to 6 weeks.   DVT prophylaxis: loveno Family Communication:husband Status is: Inpatient Remains inpatient appropriate because: Sepsis due to to acute acute pyelonephritis which failed oral antibiotic    Code Status:     Code Status Orders  (From admission, onward)           Start      Ordered   02/08/22 1620  Full code  Continuous        02/08/22 1623           Code Status History     Date Active Date Inactive Code Status Order ID Comments User Context   01/27/2022 1612 01/31/2022 1731 Full Code [id number]  Antonieta Pert, MD ED   06/01/2021 1656 06/02/2021 2032 Full Code [id number]  Derl Barrow, PA Inpatient      Advance Directive Documentation    Flowsheet Row Most Recent Value  Type of Advance Directive Healthcare Power of Attorney  Pre-existing out of facility DNR order (yellow form or pink MOST form) --  "MOST" Form in Place? --         IV Access:   Peripheral IV   Procedures and diagnostic studies:   No results found.   Medical Consultants:   None.   Subjective:    NICKIA BOESEN no complaints feels great.  Objective:    Vitals:   02/12/22 1357 02/12/22 1932 02/12/22 2014 02/13/22 0513  BP: 135/65  (!) 151/70 (!) 156/95  Pulse: 89  93 95  Resp: '16  18 18  '$ Temp: 98.1 F (36.7 C) 99.2 F (37.3 C) 98.6 F (37 C) (!) 100.5 F (38.1 C)  TempSrc: Oral Oral Oral Oral  SpO2: 95%  97% 95%  Weight:      Height:       SpO2: 95 %   Intake/Output Summary (Last 24 hours) at 02/13/2022 0859 Last data filed at 02/13/2022 0500 Gross per 24 hour  Intake 270 ml  Output --  Net 270 ml  Filed Weights   02/07/22 1758  Weight: 73.5 kg    Exam: General exam: In no acute distress. Respiratory system: Good air movement and clear to auscultation. Cardiovascular system: S1 & S2 heard, RRR. No JVD. Gastrointestinal system: Abdomen is nondistended, soft and nontender.  Extremities: No pedal edema. Skin: No rashes, lesions or ulcers Psychiatry: Judgement and insight appear normal. Mood & affect appropriate. Data Reviewed:    Labs: Basic Metabolic Panel: Recent Labs  Lab 02/07/22 1845 02/10/22 0440  NA 135 137  K 3.7 4.2  CL 100 103  CO2 24 27  GLUCOSE 102* 118*  BUN 10 11  CREATININE 1.18* 1.18*  CALCIUM 10.1 9.6   MG  --  1.8    GFR Estimated Creatinine Clearance: 35.8 mL/min (A) (by C-G formula based on SCr of 1.18 mg/dL (H)). Liver Function Tests: Recent Labs  Lab 02/07/22 1845 02/10/22 0440  AST 8* 10*  ALT 7 10  ALKPHOS 98 95  BILITOT 0.6 0.5  PROT 7.0 6.1*  ALBUMIN 3.6 2.5*    No results for input(s): "LIPASE", "AMYLASE" in the last 168 hours. No results for input(s): "AMMONIA" in the last 168 hours. Coagulation profile Recent Labs  Lab 02/09/22 0421  INR 1.2    COVID-19 Labs  Recent Labs    02/12/22 0947  FERRITIN 346*     Lab Results  Component Value Date   SARSCOV2NAA NEGATIVE 02/07/2022   SARSCOV2NAA NEGATIVE 01/27/2022   SARSCOV2NAA RESULT: NEGATIVE 05/28/2021    CBC: Recent Labs  Lab 02/07/22 1845 02/10/22 0440 02/11/22 0850  WBC 13.2* 13.3* 8.9  NEUTROABS 10.7*  --  6.8  HGB 9.2* 8.0* 7.7*  HCT 28.9* 26.5* 25.2*  MCV 94.1 97.1 96.2  PLT 470* 406* 386    Cardiac Enzymes: No results for input(s): "CKTOTAL", "CKMB", "CKMBINDEX", "TROPONINI" in the last 168 hours. BNP (last 3 results) No results for input(s): "PROBNP" in the last 8760 hours. CBG: No results for input(s): "GLUCAP" in the last 168 hours. D-Dimer: No results for input(s): "DDIMER" in the last 72 hours. Hgb A1c: No results for input(s): "HGBA1C" in the last 72 hours. Lipid Profile: No results for input(s): "CHOL", "HDL", "LDLCALC", "TRIG", "CHOLHDL", "LDLDIRECT" in the last 72 hours. Thyroid function studies: No results for input(s): "TSH", "T4TOTAL", "T3FREE", "THYROIDAB" in the last 72 hours.  Invalid input(s): "FREET3" Anemia work up: Recent Labs    02/12/22 0947  VITAMINB12 334  FOLATE 29.1  FERRITIN 346*  TIBC 191*  IRON 19*  RETICCTPCT 1.0   Sepsis Labs: Recent Labs  Lab 02/07/22 1845 02/07/22 2012 02/09/22 0421 02/10/22 0440 02/11/22 0850  PROCALCITON  --   --  0.47  --   --   WBC 13.2*  --   --  13.3* 8.9  LATICACIDVEN 0.9 0.9  --   --   --      Microbiology Recent Results (from the past 240 hour(s))  Resp Panel by RT-PCR (Flu A&B, Covid) Anterior Nasal Swab     Status: None   Collection Time: 02/07/22  6:27 PM   Specimen: Anterior Nasal Swab  Result Value Ref Range Status   SARS Coronavirus 2 by RT PCR NEGATIVE NEGATIVE Final    Comment: (NOTE) SARS-CoV-2 target nucleic acids are NOT DETECTED.  The SARS-CoV-2 RNA is generally detectable in upper respiratory specimens during the acute phase of infection. The lowest concentration of SARS-CoV-2 viral copies this assay can detect is 138 copies/mL. A negative result does not preclude SARS-Cov-2 infection  and should not be used as the sole basis for treatment or other patient management decisions. A negative result may occur with  improper specimen collection/handling, submission of specimen other than nasopharyngeal swab, presence of viral mutation(s) within the areas targeted by this assay, and inadequate number of viral copies(<138 copies/mL). A negative result must be combined with clinical observations, patient history, and epidemiological information. The expected result is Negative.  Fact Sheet for Patients:  EntrepreneurPulse.com.au  Fact Sheet for Healthcare Providers:  IncredibleEmployment.be  This test is no t yet approved or cleared by the [place] FDA and  has been authorized for detection and/or diagnosis of SARS-CoV-2 by FDA under an Emergency Use Authorization (EUA). This EUA will remain  in effect (meaning this test can be used) for the duration of the COVID-19 declaration under Section 564(b)(1) of the Act, 21 U.S.C.section 360bbb-3(b)(1), unless the authorization is terminated  or revoked sooner.       Influenza A by PCR NEGATIVE NEGATIVE Final   Influenza B by PCR NEGATIVE NEGATIVE Final    Comment: (NOTE) The Xpert Xpress SARS-CoV-2/FLU/RSV plus assay is intended as an aid in the diagnosis of influenza  from Nasopharyngeal swab specimens and should not be used as a sole basis for treatment. Nasal washings and aspirates are unacceptable for Xpert Xpress SARS-CoV-2/FLU/RSV testing.  Fact Sheet for Patients: EntrepreneurPulse.com.au  Fact Sheet for Healthcare Providers: IncredibleEmployment.be  This test is not yet approved or cleared by the [place] FDA and has been authorized for detection and/or diagnosis of SARS-CoV-2 by FDA under an Emergency Use Authorization (EUA). This EUA will remain in effect (meaning this test can be used) for the duration of the COVID-19 declaration under Section 564(b)(1) of the Act, 21 U.S.C. section 360bbb-3(b)(1), unless the authorization is terminated or revoked.  Performed at [facility], [address], [place], [place] [address]   Culture, blood (routine x 2)     Status: None   Collection Time: 02/07/22  6:27 PM   Specimen: BLOOD  Result Value Ref Range Status   Specimen Description   Final    BLOOD RIGHT ANTECUBITAL Performed at [facility], [address] [place], [place] [address]    Special Requests   Final    BOTTLES DRAWN AEROBIC AND ANAEROBIC Blood Culture adequate volume Performed at [facility], [address] [place], [place] [address]    Culture   Final    NO GROWTH 5 DAYS Performed at [facility], [place] [address], [place], [place] [address]    Report Status 02/12/2022 FINAL  Final  Culture, blood (routine x 2)     Status: None   Collection Time: 02/07/22  6:27 PM   Specimen: BLOOD  Result Value Ref Range Status   Specimen Description   Final    BLOOD LEFT ANTECUBITAL Performed at [facility], [address], [place], [place] [address]    Special Requests   Final    BOTTLES DRAWN AEROBIC AND ANAEROBIC Blood Culture adequate volume Performed at [facility], [address] [place], [place] [address]    Culture   Final    NO GROWTH 5 DAYS Performed at [facility], [place] [address] [place], [place] [address]    Report Status 02/12/2022 FINAL  Final  Urine Culture     Status: None   Collection Time: 02/07/22  8:16 PM   Specimen: Urine, Clean Catch  Result Value Ref Range Status  Specimen Description   Final    URINE, CLEAN CATCH Performed at [facility], [address] [place], [place] [address]    Special Requests   Final    NONE Performed at [facility], [address], [place], [place] [address]    Culture   Final    NO GROWTH Performed at [facility], [place] [address] [place], [place] [address]    Report Status 02/09/2022 FINAL  Final     Medications:    amitriptyline  50 mg Oral QHS   ferrous sulfate  325 mg Oral Q breakfast   levothyroxine  75 mcg Oral QAC breakfast   pantoprazole  40 mg Oral q morning   sodium chloride  1 spray Each Nare Daily   Continuous Infusions:  cefTRIAXone (ROCEPHIN)  IV 2 g (02/12/22 0927)   lactated ringers 10 mL/hr at 02/11/22 0900      LOS: 5 days   Charlynne Cousins  [facility]  02/13/2022, 8:59 AM

## 2022-02-13 NOTE — Progress Notes (Deleted)
SATURATION QUALIFICATIONS: (This note is used to comply with regulatory documentation for home oxygen)  Patient Saturations on Room Air at Rest = 100%  Patient Saturations on Room Air while Ambulating = 95%  Patient Saturations on 1 Liters of oxygen while Ambulating = 97%  Please briefly explain why patient needs home oxygen:

## 2022-02-14 DIAGNOSIS — N10 Acute tubulo-interstitial nephritis: Secondary | ICD-10-CM | POA: Diagnosis not present

## 2022-02-14 NOTE — Progress Notes (Signed)
[facility] PROGRESS NOTE    Progress Note  Janet Mccarthy  ALP:[id number] DOB: Jun 10, 1935 DOA: 02/07/2022 PCP: Crist Infante, MD     Brief Narrative:   Janet Mccarthy is an 87 y.o. female past medical history of hypothyroidism, essential hypertension, chronic kidney stage IIIa comes into the hospital with fever and chills she was recently discharged about 10 days prior to admission secondary to sepsis due to UTI was found to have E. coli bacteremia treated with Rocephin and discharged on oral antibiotics.  4 days after discharge she start experiencing fevers again. Last blood culture and urine culture grew E. coli resistant to penicillin and sensitive to cephalosporins, she failed oral cefadroxil as an outpatient. CT scan of the abdomen pelvis was done that showed no abscess. Assessment/Plan:   Sepsis secondary to acute pyelonephritis which has failed outpatient oral antibiotic treatment: Tmax of 97.8.  Surveillance blood cultures and urine cultures remain negative. ID was consulted recommended to they would prefer afebrile for 48 hours prior to discharge. Continue IV antibiotics for an additional 24 hours can transition to oral tomorrow and be discharged. Continue to work with physical therapy. Out of bed to chair.  Normocytic anemia: Follow-up with PCP as an outpatient. Check an RBC folate and B12 started on iron therapy.  Chronic kidney stage IIIa: Creatinine appears to be at baseline.  Essential hypertension: Continue to hold antihypertensive medication.  Hypothyroidism Continue Synthroid.  Incidental 4 mm left upper lobe lung nodule: To follow-up with PCP 4 to 6 weeks.   DVT prophylaxis: loveno Family Communication:husband Status is: Inpatient Remains inpatient appropriate because: Sepsis due to to acute acute pyelonephritis which failed oral antibiotic    Code Status:     Code Status Orders  (From admission, onward)           Start      Ordered   02/08/22 1620  Full code  Continuous        02/08/22 1623           Code Status History     Date Active Date Inactive Code Status Order ID Comments User Context   01/27/2022 1612 01/31/2022 1731 Full Code [id number]  Antonieta Pert, MD ED   06/01/2021 1656 06/02/2021 2032 Full Code [id number]  Derl Barrow, PA Inpatient      Advance Directive Documentation    Flowsheet Row Most Recent Value  Type of Advance Directive Healthcare Power of Attorney  Pre-existing out of facility DNR order (yellow form or pink MOST form) --  "MOST" Form in Place? --         IV Access:   Peripheral IV   Procedures and diagnostic studies:   No results found.   Medical Consultants:   None.   Subjective:    Janet Mccarthy feels great wants to go home  Objective:    Vitals:   02/13/22 1327 02/13/22 1628 02/13/22 2038 02/14/22 0527  BP: (!) 148/65  (!) 161/70 (!) 156/75  Pulse: 97  93 94  Resp: '16  18 18  '$ Temp: 97.8 F (36.6 C) 98.6 F (37 C) 99.4 F (37.4 C) 98.6 F (37 C)  TempSrc: Oral Oral Oral   SpO2: 99%  98% 95%  Weight:      Height:       SpO2: 95 %   Intake/Output Summary (Last 24 hours) at 02/14/2022 0849 Last data filed at 02/14/2022 0600 Gross per 24 hour  Intake 820 ml  Output --  Net  820 ml    Filed Weights   02/07/22 1758  Weight: 73.5 kg    Exam: General exam: In no acute distress. Respiratory system: Good air movement and clear to auscultation. Cardiovascular system: S1 & S2 heard, RRR. No JVD. Gastrointestinal system: Abdomen is nondistended, soft and nontender.  Extremities: No pedal edema. Skin: No rashes, lesions or ulcers Psychiatry: Judgement and insight appear normal. Mood & affect appropriate. Data Reviewed:    Labs: Basic Metabolic Panel: Recent Labs  Lab 02/07/22 1845 02/10/22 0440  NA 135 137  K 3.7 4.2  CL 100 103  CO2 24 27  GLUCOSE 102* 118*  BUN 10 11  CREATININE 1.18* 1.18*  CALCIUM 10.1 9.6  MG   --  1.8    GFR Estimated Creatinine Clearance: 35.8 mL/min (A) (by C-G formula based on SCr of 1.18 mg/dL (H)). Liver Function Tests: Recent Labs  Lab 02/07/22 1845 02/10/22 0440  AST 8* 10*  ALT 7 10  ALKPHOS 98 95  BILITOT 0.6 0.5  PROT 7.0 6.1*  ALBUMIN 3.6 2.5*    No results for input(s): "LIPASE", "AMYLASE" in the last 168 hours. No results for input(s): "AMMONIA" in the last 168 hours. Coagulation profile Recent Labs  Lab 02/09/22 0421  INR 1.2    COVID-19 Labs  Recent Labs    02/12/22 0947  FERRITIN 346*     Lab Results  Component Value Date   SARSCOV2NAA NEGATIVE 02/07/2022   SARSCOV2NAA NEGATIVE 01/27/2022   SARSCOV2NAA RESULT: NEGATIVE 05/28/2021    CBC: Recent Labs  Lab 02/07/22 1845 02/10/22 0440 02/11/22 0850  WBC 13.2* 13.3* 8.9  NEUTROABS 10.7*  --  6.8  HGB 9.2* 8.0* 7.7*  HCT 28.9* 26.5* 25.2*  MCV 94.1 97.1 96.2  PLT 470* 406* 386    Cardiac Enzymes: No results for input(s): "CKTOTAL", "CKMB", "CKMBINDEX", "TROPONINI" in the last 168 hours. BNP (last 3 results) No results for input(s): "PROBNP" in the last 8760 hours. CBG: No results for input(s): "GLUCAP" in the last 168 hours. D-Dimer: No results for input(s): "DDIMER" in the last 72 hours. Hgb A1c: No results for input(s): "HGBA1C" in the last 72 hours. Lipid Profile: No results for input(s): "CHOL", "HDL", "LDLCALC", "TRIG", "CHOLHDL", "LDLDIRECT" in the last 72 hours. Thyroid function studies: No results for input(s): "TSH", "T4TOTAL", "T3FREE", "THYROIDAB" in the last 72 hours.  Invalid input(s): "FREET3" Anemia work up: Recent Labs    02/12/22 0947  VITAMINB12 334  FOLATE 29.1  FERRITIN 346*  TIBC 191*  IRON 19*  RETICCTPCT 1.0    Sepsis Labs: Recent Labs  Lab 02/07/22 1845 02/07/22 2012 02/09/22 0421 02/10/22 0440 02/11/22 0850  PROCALCITON  --   --  0.47  --   --   WBC 13.2*  --   --  13.3* 8.9  LATICACIDVEN 0.9 0.9  --   --   --      Microbiology Recent Results (from the past 240 hour(s))  Resp Panel by RT-PCR (Flu A&B, Covid) Anterior Nasal Swab     Status: None   Collection Time: 02/07/22  6:27 PM   Specimen: Anterior Nasal Swab  Result Value Ref Range Status   SARS Coronavirus 2 by RT PCR NEGATIVE NEGATIVE Final    Comment: (NOTE) SARS-CoV-2 target nucleic acids are NOT DETECTED.  The SARS-CoV-2 RNA is generally detectable in upper respiratory specimens during the acute phase of infection. The lowest concentration of SARS-CoV-2 viral copies this assay can detect is 138 copies/mL. A negative  result does not preclude SARS-Cov-2 infection and should not be used as the sole basis for treatment or other patient management decisions. A negative result may occur with  improper specimen collection/handling, submission of specimen other than nasopharyngeal swab, presence of viral mutation(s) within the areas targeted by this assay, and inadequate number of viral copies(<138 copies/mL). A negative result must be combined with clinical observations, patient history, and epidemiological information. The expected result is Negative.  Fact Sheet for Patients:  EntrepreneurPulse.com.au  Fact Sheet for Healthcare Providers:  IncredibleEmployment.be  This test is no t yet approved or cleared by the [place] FDA and  has been authorized for detection and/or diagnosis of SARS-CoV-2 by FDA under an Emergency Use Authorization (EUA). This EUA will remain  in effect (meaning this test can be used) for the duration of the COVID-19 declaration under Section 564(b)(1) of the Act, 21 U.S.C.section 360bbb-3(b)(1), unless the authorization is terminated  or revoked sooner.       Influenza A by PCR NEGATIVE NEGATIVE Final   Influenza B by PCR NEGATIVE NEGATIVE Final    Comment: (NOTE) The Xpert Xpress SARS-CoV-2/FLU/RSV plus assay is intended as an aid in the diagnosis of influenza  from Nasopharyngeal swab specimens and should not be used as a sole basis for treatment. Nasal washings and aspirates are unacceptable for Xpert Xpress SARS-CoV-2/FLU/RSV testing.  Fact Sheet for Patients: EntrepreneurPulse.com.au  Fact Sheet for Healthcare Providers: IncredibleEmployment.be  This test is not yet approved or cleared by the [place] FDA and has been authorized for detection and/or diagnosis of SARS-CoV-2 by FDA under an Emergency Use Authorization (EUA). This EUA will remain in effect (meaning this test can be used) for the duration of the COVID-19 declaration under Section 564(b)(1) of the Act, 21 U.S.C. section 360bbb-3(b)(1), unless the authorization is terminated or revoked.  Performed at [facility], [address] [place], [place] [address]   Culture, blood (routine x 2)     Status: None   Collection Time: 02/07/22  6:27 PM   Specimen: BLOOD  Result Value Ref Range Status   Specimen Description   Final    BLOOD RIGHT ANTECUBITAL Performed at [facility], [address] [place], [place] [address]    Special Requests   Final    BOTTLES DRAWN AEROBIC AND ANAEROBIC Blood Culture adequate volume Performed at [facility], [address] [place], [place] [address]    Culture   Final    NO GROWTH 5 DAYS Performed at [facility], [place] [address] [place], [place] [address]    Report Status 02/12/2022 FINAL  Final  Culture, blood (routine x 2)     Status: None   Collection Time: 02/07/22  6:27 PM   Specimen: BLOOD  Result Value Ref Range Status   Specimen Description   Final    BLOOD LEFT ANTECUBITAL Performed at [facility], [address] [place], [place] [address]    Special Requests   Final    BOTTLES DRAWN AEROBIC AND ANAEROBIC Blood Culture adequate volume Performed at [facility], [address], [place], [place] [address]    Culture   Final    NO GROWTH 5 DAYS Performed at [facility], [place] [address] [place], [place] [address]    Report Status 02/12/2022 FINAL  Final  Urine Culture     Status: None   Collection Time: 02/07/22  8:16 PM   Specimen: Urine, Clean Catch  Result  Value Ref Range Status   Specimen Description   Final    URINE, CLEAN CATCH Performed at [facility], [address] [place], [place] [address]    Special Requests   Final    NONE Performed at [facility], [address] [place], [place] [address]    Culture   Final    NO GROWTH Performed at [facility], [place] [address], [place], [place] [address]    Report Status 02/09/2022 FINAL  Final     Medications:    amitriptyline  50 mg Oral QHS   ferrous sulfate  325 mg Oral Q breakfast   levothyroxine  75 mcg Oral QAC breakfast   pantoprazole  40 mg Oral q morning   sodium chloride  1 spray Each Nare Daily   Continuous Infusions:  cefTRIAXone (ROCEPHIN)  IV 2 g (02/13/22 0923)   lactated ringers 10 mL/hr at 02/11/22 0900      LOS: 6 days   Charlynne Cousins  [facility]  02/14/2022, 8:49 AM

## 2022-02-14 NOTE — Plan of Care (Signed)
  Problem: Education: Goal: Knowledge of General Education information will improve Description: Including pain rating scale, medication(s)/side effects and non-pharmacologic comfort measures Outcome: Progressing   Problem: Health Behavior/Discharge Planning: Goal: Ability to manage health-related needs will improve Outcome: Progressing   Problem: Clinical Measurements: Goal: Ability to maintain clinical measurements within normal limits will improve Outcome: Progressing   Problem: Safety: Goal: Ability to remain free from injury will improve Outcome: Progressing   Problem: Urinary Elimination: Goal: Signs and symptoms of infection will decrease Outcome: Progressing

## 2022-02-15 DIAGNOSIS — N12 Tubulo-interstitial nephritis, not specified as acute or chronic: Secondary | ICD-10-CM | POA: Diagnosis not present

## 2022-02-15 DIAGNOSIS — I10 Essential (primary) hypertension: Secondary | ICD-10-CM | POA: Diagnosis not present

## 2022-02-15 DIAGNOSIS — D649 Anemia, unspecified: Secondary | ICD-10-CM | POA: Diagnosis not present

## 2022-02-15 DIAGNOSIS — N10 Acute tubulo-interstitial nephritis: Secondary | ICD-10-CM | POA: Diagnosis not present

## 2022-02-15 MED ORDER — FERROUS SULFATE 325 (65 FE) MG PO TABS: 325.0000 mg | ORAL_TABLET | Freq: Every day | ORAL | 3 refills | Status: AC

## 2022-02-15 MED ORDER — CIPROFLOXACIN HCL 500 MG PO TABS: 500.0000 mg | ORAL_TABLET | Freq: Two times a day (BID) | ORAL | 0 refills | Status: AC

## 2022-02-15 NOTE — Progress Notes (Signed)
AVS and discharge instructions have been reviewed w/ patient and patient's husband at the bedside. Both verbalized understanding and had no further questions.

## 2022-02-15 NOTE — Plan of Care (Signed)
  Problem: Education: Goal: Knowledge of General Education information will improve Description: Including pain rating scale, medication(s)/side effects and non-pharmacologic comfort measures Outcome: Progressing   Problem: Health Behavior/Discharge Planning: Goal: Ability to manage health-related needs will improve Outcome: Progressing   Problem: Safety: Goal: Ability to remain free from injury will improve Outcome: Progressing   Problem: Urinary Elimination: Goal: Signs and symptoms of infection will decrease Outcome: Progressing

## 2022-02-15 NOTE — Discharge Summary (Signed)
Physician Discharge Summary  Janet Mccarthy EGB:[id number] DOB: [birth year] DOA: 02/07/2022  PCP: Crist Infante, MD  Admit date: 02/07/2022 Discharge date: 02/15/2022  Admitted From: Home Disposition:  Home  Recommendations for Outpatient Follow-up:  Follow up with PCP in 1-2 weeks Please obtain BMP/CBC in one week Repeated CT scan in 6 weeks for an incidental 4 mm left upper lobe nodule.   Home Health:no Equipment/Devices:None  Discharge Condition:Stable CODE STATUS:Full Diet recommendation: Heart Healthy   Brief/Interim Summary: 87 y.o. female past medical history of hypothyroidism, essential hypertension, chronic kidney stage IIIa comes into the hospital with fever and chills she was recently discharged about 10 days prior to admission secondary to sepsis due to UTI was found to have E. coli bacteremia treated with Rocephin and discharged on oral antibiotics.  4 days after discharge she start experiencing fevers again. Last blood culture and urine culture grew E. coli resistant to penicillin and sensitive to cephalosporins, she failed oral cefadroxil as an outpatient. CT scan of the abdomen pelvis was done that showed no abscess.  Discharge Diagnoses:  Principal Problem:   Acute pyelonephritis Active Problems:   Hypothyroidism   GERD (gastroesophageal reflux disease)   Stage 3a chronic kidney disease (CKD) (HCC)   HTN (hypertension)   Normocytic anemia  Sepsis secondary to acute pyelonephritis with failed outpatient oral antibiotic treatment. She was admitted to the hospital CT scan of the abdomen pelvis was done that showed no abscesses. She was started on Rocephin, due to her ongoing fever past 5 days on IV Rocephin ID was consulted recommended to continue current regimen and to be discharged on ciprofloxacin.  Normocytic anemia: Ferritin of 346 was sent home on ferrous sulfate.  Chronic kidney disease stage IIIa: Creatinine appears to be at baseline.  Central  hypertension: Resume home regimen as an outpatient no changes made to her medication.  Hypothyroidism: Continue Synthroid.  Incidental 4 mm left upper lobe nodule: We will need follow-up appointment with PCP check a CT scan at that time.  Discharge Instructions  Discharge Instructions     Diet - low sodium heart healthy   Complete by: As directed    Increase activity slowly   Complete by: As directed       Allergies as of 02/15/2022       Reactions   Omeprazole Other (See Comments)   Other reaction(s): abd pain   Rabeprazole Swelling, Other (See Comments)   Other reaction(s): lip redness and swelling   Sulfa Antibiotics Itching   Other reaction(s): vomiting        Medication List     STOP taking these medications    cefadroxil 500 MG capsule Commonly known as: DURICEF       TAKE these medications    acetaminophen 500 MG tablet Commonly known as: TYLENOL Take 500 mg by mouth every 6 (six) hours as needed for fever or moderate pain.   amitriptyline 50 MG tablet Commonly known as: ELAVIL Take 50 mg by mouth at bedtime.   ciprofloxacin 500 MG tablet Commonly known as: Cipro Take 1 tablet (500 mg total) by mouth 2 (two) times daily for 5 days.   clobetasol cream 0.05 % Commonly known as: TEMOVATE Apply 1 application topically daily as needed (irritation).   ferrous sulfate 325 (65 FE) MG tablet Take 1 tablet (325 mg total) by mouth daily with breakfast. Start taking on: February 16, 2022   FLORASTOR PO Take 500 mg by mouth 2 (two) times daily. What changed: Another medication  with the same name was removed. Continue taking this medication, and follow the directions you see here.   levothyroxine 75 MCG tablet Commonly known as: SYNTHROID Take 75 mcg by mouth daily before breakfast.   pantoprazole 40 MG tablet Commonly known as: PROTONIX Take 1 tablet (40 mg total) by mouth daily. Office visit for further refills What changed:  when to take  this additional instructions   sodium chloride 0.65 % Soln nasal spray Commonly known as: OCEAN Place 1 spray into both nostrils daily.        Allergies  Allergen Reactions   Omeprazole Other (See Comments)    Other reaction(s): abd pain   Rabeprazole Swelling and Other (See Comments)    Other reaction(s): lip redness and swelling   Sulfa Antibiotics Itching    Other reaction(s): vomiting    Consultations: Infectious disease   Procedures/Studies: CT CHEST ABDOMEN PELVIS W CONTRAST  Result Date: 02/07/2022 CLINICAL DATA:  Sepsis, fever, cough, recent UTI. EXAM: CT CHEST, ABDOMEN, AND PELVIS WITH CONTRAST TECHNIQUE: Multidetector CT imaging of the chest, abdomen and pelvis was performed following the standard protocol during bolus administration of intravenous contrast. RADIATION DOSE REDUCTION: This exam was performed according to the departmental dose-optimization program which includes automated exposure control, adjustment of the mA and/or kV according to patient size and/or use of iterative reconstruction technique. CONTRAST:  24m OMNIPAQUE IOHEXOL 300 MG/ML  SOLN COMPARISON:  01/28/2022. FINDINGS: CT CHEST FINDINGS Cardiovascular: The heart is normal in size and there is a trace pericardial effusion. Scattered coronary artery calcifications are noted. There is atherosclerotic calcification of the aorta without evidence of aneurysm. The pulmonary trunk is normal in caliber. Mediastinum/Nodes: No mediastinal, hilar, or axillary lymphadenopathy. A few nodules are present in the thyroid gland bilaterally, the largest measuring 1 cm. The trachea and esophagus are within normal limits. There is a moderate hiatal hernia. Lungs/Pleura: There is atelectasis at the lung bases with small bilateral pleural effusions. No pneumothorax. A 4 mm nodule is present in the left upper lobe, axial image 90. Musculoskeletal: Degenerative changes are present in the thoracic spine. No acute osseous  abnormality. CT ABDOMEN PELVIS FINDINGS Hepatobiliary: No focal liver abnormality is seen. Multiple stones are present within the gallbladder. No biliary ductal dilatation. Pancreas: There is pancreatic atrophy at the head. No pancreatic ductal dilatation or surrounding inflammatory changes. Spleen: Normal in size without focal abnormality. Adrenals/Urinary Tract: The adrenal glands are within normal limits. Cysts are noted in the kidneys bilaterally. Additional subcentimeter hypodensities are noted bilaterally which are too small to further characterize. Patchy areas of hypoenhancement are present in the kidneys bilaterally and best seen on delayed imaging. No renal calculus or hydronephrosis. There is mild fat stranding at the renal pelvis on the right. The bladder is not well seen due to metallic hardware artifact. Stomach/Bowel: Moderate hiatal hernia. Stomach is otherwise within normal limits. Appendix is not seen. No evidence of bowel wall thickening, distention, or inflammatory changes. No free air or pneumatosis. Vascular/Lymphatic: Aortic atherosclerosis. No enlarged abdominal or pelvic lymph nodes. Reproductive: Coarse calcifications are present in the uterus, likely degenerating fibroids. No adnexal mass. Other: No abdominopelvic ascites. Musculoskeletal: Degenerative changes are present in the lumbar spine. Bilateral hip arthroplasty changes are noted. No acute osseous abnormality. IMPRESSION: 1. Patchy hypoenhancement of the kidneys bilaterally with mild fat stranding at the right renal pelvis, compatible with pyelonephritis. No renal calculus or hydronephrosis. 2. Bilateral renal cysts. 3. 4 mm left upper lobe nodule. No follow-up needed if patient  is low-risk.This recommendation follows the consensus statement: Guidelines for Management of Incidental Pulmonary Nodules Detected on CT Images: From the Fleischner Society 2017; Radiology 2017; 284:228-243. 4. Cholelithiasis. 5. Moderate hiatal hernia. 6.  Aortic atherosclerosis and coronary artery calcifications. 7. Remaining incidental findings as described above. Electronically Signed   By: Brett Fairy M.D.   On: 02/07/2022 23:36   DG Chest Portable 1 View  Result Date: 02/07/2022 CLINICAL DATA:  Fever. EXAM: PORTABLE CHEST 1 VIEW COMPARISON:  Chest x-ray 01/27/2022 FINDINGS: The heart size and mediastinal contours are within normal limits. There is blunting of the left costophrenic angle. There are minimal patchy opacities in the left lung base. No pneumothorax. Degenerative changes affect the left shoulder. IMPRESSION: 1. Small left pleural effusion with minimal left lower lobe atelectasis/airspace disease. Electronically Signed   By: Ronney Asters M.D.   On: 02/07/2022 19:18   CT RENAL STONE STUDY  Result Date: 01/28/2022 CLINICAL DATA:  Flank pain.  Concern for kidney stone. EXAM: CT ABDOMEN AND PELVIS WITHOUT CONTRAST TECHNIQUE: Multidetector CT imaging of the abdomen and pelvis was performed following the standard protocol without IV contrast. RADIATION DOSE REDUCTION: This exam was performed according to the departmental dose-optimization program which includes automated exposure control, adjustment of the mA and/or kV according to patient size and/or use of iterative reconstruction technique. COMPARISON:  None Available. FINDINGS: Evaluation of this exam is limited in the absence of intravenous contrast. Lower chest: Trace bilateral pleural effusions versus pleural thickening. There are bibasilar scarring. There is calcification of the mitral annulus. No intra-abdominal free air or free fluid. Hepatobiliary: The liver is unremarkable. No intrahepatic biliary dilatation. Multiple gallstones. No pericholecystic fluid or evidence of acute cholecystitis by CT. Pancreas: Unremarkable. No pancreatic ductal dilatation or surrounding inflammatory changes. Spleen: Normal in size without focal abnormality. Adrenals/Urinary Tract: The adrenal glands are  unremarkable. There is no hydronephrosis or nephrolithiasis on either side. There is right perinephric and periureteric stranding with minimal fullness of the right ureter. Findings may be related to a recently passed right renal calculus versus pyelonephritis. Correlation with urinalysis recommended. Several bilateral renal cysts measure up to 4.5 cm in the upper pole of the right kidney. Additional subcentimeter hypodense lesions are too small to characterize on this noncontrast CT. This can be better evaluated with ultrasound on a nonemergent/outpatient basis. The bladder is collapsed but poorly visualized due to streak artifact caused by bilateral hip arthroplasties. Stomach/Bowel: There is a small hiatal hernia. There is no bowel obstruction or active inflammation. No CT evidence of acute appendicitis. Vascular/Lymphatic: Advanced aortoiliac atherosclerotic disease. The IVC is grossly unremarkable. No portal venous gas. There is no adenopathy. Reproductive: Several calcified fibroids.  No adnexal masses. Other: Midline vertical anterior pelvic wall incisional scar. Musculoskeletal: Degenerative changes of the spine. Bilateral total hip arthroplasties. No acute osseous pathology. IMPRESSION: 1. No hydronephrosis or nephrolithiasis. Possible recently passed right renal calculus versus pyelonephritis. Correlation with urinalysis recommended. 2. Cholelithiasis. 3. Aortic Atherosclerosis (ICD10-I70.0). Electronically Signed   By: Anner Crete M.D.   On: 01/28/2022 13:26   DG Chest Port 1 View  Result Date: 01/27/2022 CLINICAL DATA:  Fever, sepsis EXAM: PORTABLE CHEST 1 VIEW COMPARISON:  05/28/2004 FINDINGS: The heart size and mediastinal contours are within normal limits. Both lungs are clear. The visualized skeletal structures are unremarkable. IMPRESSION: No active disease. Electronically Signed   By: Franchot Gallo M.D.   On: 01/27/2022 17:34   (Echo, Carotid, EGD, Colonoscopy, ERCP)     Subjective: No complaints  Discharge Exam: Vitals:   02/14/22 2121 02/15/22 0527  BP: (!) 158/69 (!) 145/70  Pulse: 95 91  Resp: 20 20  Temp: 98.7 F (37.1 C) 98.4 F (36.9 C)  SpO2: 96% 93%   Vitals:   02/14/22 0527 02/14/22 1215 02/14/22 2121 02/15/22 0527  BP: (!) 156/75 (!) 147/62 (!) 158/69 (!) 145/70  Pulse: 94 90 95 91  Resp: '18 16 20 20  '$ Temp: 98.6 F (37 C) 97.8 F (36.6 C) 98.7 F (37.1 C) 98.4 F (36.9 C)  TempSrc:  Oral Oral Oral  SpO2: 95% 99% 96% 93%  Weight:      Height:        General: Pt is alert, awake, not in acute distress Cardiovascular: RRR, S1/S2 +, no rubs, no gallops Respiratory: CTA bilaterally, no wheezing, no rhonchi Abdominal: Soft, NT, ND, bowel sounds + Extremities: no edema, no cyanosis    The results of significant diagnostics from this hospitalization (including imaging, microbiology, ancillary and laboratory) are listed below for reference.     Microbiology: Recent Results (from the past 240 hour(s))  Resp Panel by RT-PCR (Flu A&B, Covid) Anterior Nasal Swab     Status: None   Collection Time: 02/07/22  6:27 PM   Specimen: Anterior Nasal Swab  Result Value Ref Range Status   SARS Coronavirus 2 by RT PCR NEGATIVE NEGATIVE Final    Comment: (NOTE) SARS-CoV-2 target nucleic acids are NOT DETECTED.  The SARS-CoV-2 RNA is generally detectable in upper respiratory specimens during the acute phase of infection. The lowest concentration of SARS-CoV-2 viral copies this assay can detect is 138 copies/mL. A negative result does not preclude SARS-Cov-2 infection and should not be used as the sole basis for treatment or other patient management decisions. A negative result may occur with  improper specimen collection/handling, submission of specimen other than nasopharyngeal swab, presence of viral mutation(s) within the areas targeted by this assay, and inadequate number of viral copies(<138 copies/mL). A negative result must be  combined with clinical observations, patient history, and epidemiological information. The expected result is Negative.  Fact Sheet for Patients:  EntrepreneurPulse.com.au  Fact Sheet for Healthcare Providers:  IncredibleEmployment.be  This test is no t yet approved or cleared by the [place] FDA and  has been authorized for detection and/or diagnosis of SARS-CoV-2 by FDA under an Emergency Use Authorization (EUA). This EUA will remain  in effect (meaning this test can be used) for the duration of the COVID-19 declaration under Section 564(b)(1) of the Act, 21 U.S.C.section 360bbb-3(b)(1), unless the authorization is terminated  or revoked sooner.       Influenza A by PCR NEGATIVE NEGATIVE Final   Influenza B by PCR NEGATIVE NEGATIVE Final    Comment: (NOTE) The Xpert Xpress SARS-CoV-2/FLU/RSV plus assay is intended as an aid in the diagnosis of influenza from Nasopharyngeal swab specimens and should not be used as a sole basis for treatment. Nasal washings and aspirates are unacceptable for Xpert Xpress SARS-CoV-2/FLU/RSV testing.  Fact Sheet for Patients: EntrepreneurPulse.com.au  Fact Sheet for Healthcare Providers: IncredibleEmployment.be  This test is not yet approved or cleared by the [place] FDA and has been authorized for detection and/or diagnosis of SARS-CoV-2 by FDA under an Emergency Use Authorization (EUA). This EUA will remain in effect (meaning this test can be used) for the duration of the COVID-19 declaration under Section 564(b)(1) of the Act, 21 U.S.C. section 360bbb-3(b)(1), unless the authorization is terminated or revoked.  Performed at [facility], [address] [place], [place] [address]   Culture, blood (routine x 2)     Status: None   Collection Time: 02/07/22  6:27 PM   Specimen: BLOOD  Result Value Ref Range Status   Specimen Description    Final    BLOOD RIGHT ANTECUBITAL Performed at [facility], [address], [place], [place] [address]    Special Requests   Final    BOTTLES DRAWN AEROBIC AND ANAEROBIC Blood Culture adequate volume Performed at [facility], [address], [place], [place] [address]    Culture   Final    NO GROWTH 5 DAYS Performed at [facility], [place] [address], [place], [place] [address]    Report Status 02/12/2022 FINAL  Final  Culture, blood (routine x 2)     Status: None   Collection Time: 02/07/22  6:27 PM   Specimen: BLOOD  Result Value Ref Range Status   Specimen Description   Final    BLOOD LEFT ANTECUBITAL Performed at [facility], [address] [place], [place] [address]    Special Requests   Final    BOTTLES DRAWN AEROBIC AND ANAEROBIC Blood Culture adequate volume Performed at [facility], [address] [place], [place] [address]    Culture   Final    NO GROWTH 5 DAYS Performed at [facility], [place] [address], [place], [place] [address]    Report Status 02/12/2022 FINAL  Final  Urine Culture     Status: None   Collection Time: 02/07/22  8:16 PM   Specimen: Urine, Clean Catch  Result Value Ref Range Status   Specimen Description   Final    URINE, CLEAN CATCH Performed at [facility], [address], [place], [place] [address]    Special Requests   Final    NONE Performed at [facility], [address], [place], [place] [address]    Culture   Final    NO GROWTH Performed at [facility], [place] [address] [place], [place] [address]    Report Status 02/09/2022 FINAL  Final     Labs: BNP (last 3 results) No results for input(s): "BNP" in the last 8760 hours. Basic Metabolic Panel: Recent Labs  Lab 02/10/22 0440  NA 137  K 4.2  CL 103  CO2 27  GLUCOSE 118*  BUN 11  CREATININE 1.18*  CALCIUM 9.6  MG 1.8   Liver  Function Tests: Recent Labs  Lab 02/10/22 0440  AST 10*  ALT 10  ALKPHOS 95  BILITOT 0.5  PROT 6.1*  ALBUMIN 2.5*   No results for input(s): "LIPASE", "AMYLASE" in the last 168 hours. No results for input(s): "AMMONIA" in the last 168 hours. CBC: Recent Labs  Lab 02/10/22 0440 02/11/22 0850  WBC 13.3* 8.9  NEUTROABS  --  6.8  HGB 8.0* 7.7*  HCT 26.5* 25.2*  MCV 97.1 96.2  PLT 406* 386   Cardiac Enzymes: No results for input(s): "CKTOTAL", "CKMB", "CKMBINDEX", "TROPONINI" in the last 168 hours. BNP: Invalid input(s): "POCBNP" CBG: No results for input(s): "GLUCAP" in the last 168 hours. D-Dimer No results for input(s): "DDIMER" in the last 72 hours. Hgb A1c No results for input(s): "HGBA1C" in the last 72 hours. Lipid Profile No results for input(s): "CHOL", "HDL", "LDLCALC", "TRIG", "CHOLHDL", "LDLDIRECT" in the last 72 hours. Thyroid function studies No results for input(s): "TSH", "T4TOTAL", "T3FREE", "THYROIDAB" in the last 72 hours.  Invalid  input(s): "FREET3" Anemia work up Recent Labs    02/12/22 0947  VITAMINB12 334  FOLATE 29.1  FERRITIN 346*  TIBC 191*  IRON 19*  RETICCTPCT 1.0   Urinalysis    Component Value Date/Time   COLORURINE YELLOW 02/07/2022 2016   APPEARANCEUR CLEAR 02/07/2022 2016   LABSPEC 1.011 02/07/2022 2016   PHURINE 6.5 02/07/2022 2016   GLUCOSEU NEGATIVE 02/07/2022 2016   HGBUR NEGATIVE 02/07/2022 2016   BILIRUBINUR NEGATIVE 02/07/2022 2016   KETONESUR NEGATIVE 02/07/2022 2016   PROTEINUR TRACE (A) 02/07/2022 2016   NITRITE NEGATIVE 02/07/2022 2016   LEUKOCYTESUR NEGATIVE 02/07/2022 2016   Sepsis Labs Recent Labs  Lab 02/10/22 0440 02/11/22 0850  WBC 13.3* 8.9   Microbiology Recent Results (from the past 240 hour(s))  Resp Panel by RT-PCR (Flu A&B, Covid) Anterior Nasal Swab     Status: None   Collection Time: 02/07/22  6:27 PM   Specimen: Anterior Nasal Swab  Result Value Ref Range Status   SARS Coronavirus 2  by RT PCR NEGATIVE NEGATIVE Final    Comment: (NOTE) SARS-CoV-2 target nucleic acids are NOT DETECTED.  The SARS-CoV-2 RNA is generally detectable in upper respiratory specimens during the acute phase of infection. The lowest concentration of SARS-CoV-2 viral copies this assay can detect is 138 copies/mL. A negative result does not preclude SARS-Cov-2 infection and should not be used as the sole basis for treatment or other patient management decisions. A negative result may occur with  improper specimen collection/handling, submission of specimen other than nasopharyngeal swab, presence of viral mutation(s) within the areas targeted by this assay, and inadequate number of viral copies(<138 copies/mL). A negative result must be combined with clinical observations, patient history, and epidemiological information. The expected result is Negative.  Fact Sheet for Patients:  EntrepreneurPulse.com.au  Fact Sheet for Healthcare Providers:  IncredibleEmployment.be  This test is no t yet approved or cleared by the [place] FDA and  has been authorized for detection and/or diagnosis of SARS-CoV-2 by FDA under an Emergency Use Authorization (EUA). This EUA will remain  in effect (meaning this test can be used) for the duration of the COVID-19 declaration under Section 564(b)(1) of the Act, 21 U.S.C.section 360bbb-3(b)(1), unless the authorization is terminated  or revoked sooner.       Influenza A by PCR NEGATIVE NEGATIVE Final   Influenza B by PCR NEGATIVE NEGATIVE Final    Comment: (NOTE) The Xpert Xpress SARS-CoV-2/FLU/RSV plus assay is intended as an aid in the diagnosis of influenza from Nasopharyngeal swab specimens and should not be used as a sole basis for treatment. Nasal washings and aspirates are unacceptable for Xpert Xpress SARS-CoV-2/FLU/RSV testing.  Fact Sheet for Patients: EntrepreneurPulse.com.au  Fact  Sheet for Healthcare Providers: IncredibleEmployment.be  This test is not yet approved or cleared by the [place] FDA and has been authorized for detection and/or diagnosis of SARS-CoV-2 by FDA under an Emergency Use Authorization (EUA). This EUA will remain in effect (meaning this test can be used) for the duration of the COVID-19 declaration under Section 564(b)(1) of the Act, 21 U.S.C. section 360bbb-3(b)(1), unless the authorization is terminated or revoked.  Performed at [facility], [address] [place], [place] [address]   Culture, blood (routine x 2)     Status: None   Collection Time: 02/07/22  6:27 PM   Specimen: BLOOD  Result Value Ref Range Status   Specimen Description   Final    BLOOD RIGHT ANTECUBITAL Performed at [facility], [address], [place], [place] [address]    Special Requests   Final    BOTTLES DRAWN AEROBIC AND ANAEROBIC Blood Culture adequate volume Performed at [facility], [address], [place], [place] [address]    Culture   Final    NO GROWTH 5 DAYS Performed at [facility], [place] [address], [place], [place] [address]    Report Status 02/12/2022 FINAL  Final  Culture, blood (routine x 2)     Status: None   Collection Time: 02/07/22  6:27 PM   Specimen: BLOOD  Result Value Ref Range Status   Specimen Description   Final    BLOOD LEFT ANTECUBITAL Performed at [facility], [address], [place], [place] [address]    Special Requests   Final    BOTTLES DRAWN AEROBIC AND ANAEROBIC Blood Culture adequate volume Performed at [facility], [address], [place], [place] [address]    Culture   Final    NO GROWTH 5 DAYS Performed at [facility], [place] [address], [place], [place] [address]    Report Status 02/12/2022 FINAL  Final  Urine Culture     Status: None   Collection Time: 02/07/22  8:16 PM    Specimen: Urine, Clean Catch  Result Value Ref Range Status   Specimen Description   Final    URINE, CLEAN CATCH Performed at [facility], [address] [place], [place] [address]    Special Requests   Final    NONE Performed at [facility], [address] [place], [place] [address]    Culture   Final    NO GROWTH Performed at [facility], [place] [address], [place], [place] [address]    Report Status 02/09/2022 FINAL  Final     SIGNED:   Charlynne Cousins, MD  [facility] 02/15/2022, 9:10 AM Pager   If 7PM-7AM, please contact night-coverage [email or web address] Password [email or web address]

## 2022-02-26 ENCOUNTER — Other Ambulatory Visit: Payer: Self-pay | Admitting: Internal Medicine

## 2022-02-26 DIAGNOSIS — R911 Solitary pulmonary nodule: Secondary | ICD-10-CM

## 2022-04-19 ENCOUNTER — Ambulatory Visit
Admission: RE | Admit: 2022-04-19 | Discharge: 2022-04-19 | Disposition: A | Discharge status: Home / Self Care | Payer: Medicare Other | Source: Ambulatory Visit | Attending: Internal Medicine | Admitting: Internal Medicine

## 2022-04-19 DIAGNOSIS — R911 Solitary pulmonary nodule: Secondary | ICD-10-CM

## 2022-07-08 ENCOUNTER — Encounter (HOSPITAL_COMMUNITY): Payer: Self-pay | Admitting: Emergency Medicine

## 2022-07-08 ENCOUNTER — Emergency Department (HOSPITAL_COMMUNITY): Payer: Medicare Other

## 2022-07-08 ENCOUNTER — Emergency Department (HOSPITAL_BASED_OUTPATIENT_CLINIC_OR_DEPARTMENT_OTHER): Payer: Medicare Other

## 2022-07-08 ENCOUNTER — Inpatient Hospital Stay (HOSPITAL_COMMUNITY): Payer: Medicare Other

## 2022-07-08 ENCOUNTER — Inpatient Hospital Stay (HOSPITAL_COMMUNITY)
Admission: EM | Admit: 2022-07-08 | Discharge: 2022-07-12 | DRG: 481 | Disposition: A | Discharge status: Home Health | Payer: Medicare Other | Attending: Internal Medicine | Admitting: Internal Medicine

## 2022-07-08 DIAGNOSIS — Z79899 Other long term (current) drug therapy: Secondary | ICD-10-CM

## 2022-07-08 DIAGNOSIS — M978XXA Periprosthetic fracture around other internal prosthetic joint, initial encounter: Secondary | ICD-10-CM | POA: Diagnosis not present

## 2022-07-08 DIAGNOSIS — M9701XA Periprosthetic fracture around internal prosthetic right hip joint, initial encounter: Secondary | ICD-10-CM | POA: Diagnosis present

## 2022-07-08 DIAGNOSIS — M8580 Other specified disorders of bone density and structure, unspecified site: Secondary | ICD-10-CM | POA: Diagnosis present

## 2022-07-08 DIAGNOSIS — R609 Edema, unspecified: Secondary | ICD-10-CM | POA: Diagnosis not present

## 2022-07-08 DIAGNOSIS — E875 Hyperkalemia: Secondary | ICD-10-CM | POA: Diagnosis not present

## 2022-07-08 DIAGNOSIS — M7989 Other specified soft tissue disorders: Secondary | ICD-10-CM | POA: Diagnosis not present

## 2022-07-08 DIAGNOSIS — E039 Hypothyroidism, unspecified: Secondary | ICD-10-CM | POA: Diagnosis present

## 2022-07-08 DIAGNOSIS — N1832 Chronic kidney disease, stage 3b: Secondary | ICD-10-CM | POA: Diagnosis present

## 2022-07-08 DIAGNOSIS — K5900 Constipation, unspecified: Secondary | ICD-10-CM | POA: Diagnosis not present

## 2022-07-08 DIAGNOSIS — G8929 Other chronic pain: Secondary | ICD-10-CM | POA: Diagnosis present

## 2022-07-08 DIAGNOSIS — F32A Depression, unspecified: Secondary | ICD-10-CM | POA: Diagnosis present

## 2022-07-08 DIAGNOSIS — Z96649 Presence of unspecified artificial hip joint: Secondary | ICD-10-CM

## 2022-07-08 DIAGNOSIS — Z87891 Personal history of nicotine dependence: Secondary | ICD-10-CM

## 2022-07-08 DIAGNOSIS — D649 Anemia, unspecified: Secondary | ICD-10-CM | POA: Diagnosis present

## 2022-07-08 DIAGNOSIS — K219 Gastro-esophageal reflux disease without esophagitis: Secondary | ICD-10-CM | POA: Diagnosis present

## 2022-07-08 DIAGNOSIS — D62 Acute posthemorrhagic anemia: Secondary | ICD-10-CM | POA: Diagnosis not present

## 2022-07-08 DIAGNOSIS — Z85828 Personal history of other malignant neoplasm of skin: Secondary | ICD-10-CM | POA: Diagnosis not present

## 2022-07-08 DIAGNOSIS — Z66 Do not resuscitate: Secondary | ICD-10-CM | POA: Diagnosis present

## 2022-07-08 DIAGNOSIS — E785 Hyperlipidemia, unspecified: Secondary | ICD-10-CM | POA: Diagnosis present

## 2022-07-08 DIAGNOSIS — Z79890 Hormone replacement therapy (postmenopausal): Secondary | ICD-10-CM

## 2022-07-08 DIAGNOSIS — F419 Anxiety disorder, unspecified: Secondary | ICD-10-CM | POA: Diagnosis present

## 2022-07-08 DIAGNOSIS — Z888 Allergy status to other drugs, medicaments and biological substances status: Secondary | ICD-10-CM

## 2022-07-08 DIAGNOSIS — M9702XA Periprosthetic fracture around internal prosthetic left hip joint, initial encounter: Secondary | ICD-10-CM | POA: Diagnosis not present

## 2022-07-08 DIAGNOSIS — M79604 Pain in right leg: Principal | ICD-10-CM

## 2022-07-08 DIAGNOSIS — I10 Essential (primary) hypertension: Secondary | ICD-10-CM | POA: Diagnosis not present

## 2022-07-08 DIAGNOSIS — J449 Chronic obstructive pulmonary disease, unspecified: Secondary | ICD-10-CM | POA: Diagnosis present

## 2022-07-08 DIAGNOSIS — N1831 Chronic kidney disease, stage 3a: Secondary | ICD-10-CM | POA: Diagnosis present

## 2022-07-08 DIAGNOSIS — Z882 Allergy status to sulfonamides status: Secondary | ICD-10-CM

## 2022-07-08 DIAGNOSIS — F418 Other specified anxiety disorders: Secondary | ICD-10-CM | POA: Diagnosis not present

## 2022-07-08 LAB — CBC WITH DIFFERENTIAL/PLATELET
Abs Immature Granulocytes: 0.06 10*3/uL (ref 0.00–0.07)
Basophils Absolute: 0 10*3/uL (ref 0.0–0.1)
Basophils Relative: 0 %
Eosinophils Absolute: 0.1 10*3/uL (ref 0.0–0.5)
Eosinophils Relative: 1 %
HCT: 35.3 % — ABNORMAL LOW (ref 36.0–46.0)
Hemoglobin: 10.6 g/dL — ABNORMAL LOW (ref 12.0–15.0)
Immature Granulocytes: 1 %
Lymphocytes Relative: 8 %
Lymphs Abs: 1 10*3/uL (ref 0.7–4.0)
MCH: 29.5 pg (ref 26.0–34.0)
MCHC: 30 g/dL (ref 30.0–36.0)
MCV: 98.3 fL (ref 80.0–100.0)
Monocytes Absolute: 1.3 10*3/uL — ABNORMAL HIGH (ref 0.1–1.0)
Monocytes Relative: 11 %
Neutro Abs: 9 10*3/uL — ABNORMAL HIGH (ref 1.7–7.7)
Neutrophils Relative %: 79 %
Platelets: DECREASED 10*3/uL (ref 150–400)
RBC: 3.59 MIL/uL — ABNORMAL LOW (ref 3.87–5.11)
RDW: 15.5 % (ref 11.5–15.5)
WBC: 11.5 10*3/uL — ABNORMAL HIGH (ref 4.0–10.5)
nRBC: 0 % (ref 0.0–0.2)

## 2022-07-08 LAB — PROTIME-INR
INR: 1.1 (ref 0.8–1.2)
Prothrombin Time: 13.7 seconds (ref 11.4–15.2)

## 2022-07-08 LAB — BASIC METABOLIC PANEL
Anion gap: 8 (ref 5–15)
BUN: 26 mg/dL — ABNORMAL HIGH (ref 8–23)
CO2: 23 mmol/L (ref 22–32)
Calcium: 10.1 mg/dL (ref 8.9–10.3)
Chloride: 106 mmol/L (ref 98–111)
Creatinine, Ser: 1.23 mg/dL — ABNORMAL HIGH (ref 0.44–1.00)
GFR, Estimated: 43 mL/min — ABNORMAL LOW (ref >60)
Glucose, Bld: 112 mg/dL — ABNORMAL HIGH (ref 70–99)
Potassium: 4 mmol/L (ref 3.5–5.1)
Sodium: 137 mmol/L (ref 135–145)

## 2022-07-08 LAB — TYPE AND SCREEN
ABO/RH(D): O POS
Antibody Screen: NEGATIVE

## 2022-07-08 MED ORDER — PANTOPRAZOLE SODIUM 40 MG PO TBEC
40.0000 mg | DELAYED_RELEASE_TABLET | Freq: Every morning | ORAL | Status: DC
  Administered 2022-07-10 – 2022-07-12 (×3): 40 mg via ORAL
  Filled 2022-07-08 (×3): qty 1

## 2022-07-08 MED ORDER — HYDROMORPHONE HCL 1 MG/ML IJ SOLN: 0.5000 mg | INTRAMUSCULAR | Status: DC | PRN

## 2022-07-08 MED ORDER — ONDANSETRON HCL 4 MG/2ML IJ SOLN: 4.0000 mg | Freq: Four times a day (QID) | INTRAMUSCULAR | Status: DC | PRN

## 2022-07-08 MED ORDER — LEVOTHYROXINE SODIUM 75 MCG PO TABS
75.0000 ug | ORAL_TABLET | Freq: Every day | ORAL | Status: DC
  Administered 2022-07-10 – 2022-07-12 (×3): 75 ug via ORAL
  Filled 2022-07-08 (×3): qty 1

## 2022-07-08 MED ORDER — SENNOSIDES-DOCUSATE SODIUM 8.6-50 MG PO TABS: 1.0000 | ORAL_TABLET | Freq: Every evening | ORAL | Status: DC | PRN

## 2022-07-08 MED ORDER — ACETAMINOPHEN 325 MG PO TABS: 650.0000 mg | ORAL_TABLET | Freq: Four times a day (QID) | ORAL | Status: DC | PRN

## 2022-07-08 MED ORDER — HYDROCODONE-ACETAMINOPHEN 5-325 MG PO TABS: 1.0000 | ORAL_TABLET | Freq: Four times a day (QID) | ORAL | Status: DC | PRN

## 2022-07-08 MED ORDER — AMITRIPTYLINE HCL 50 MG PO TABS
50.0000 mg | ORAL_TABLET | Freq: Every day | ORAL | Status: DC
  Administered 2022-07-08 – 2022-07-11 (×4): 50 mg via ORAL
  Filled 2022-07-08: qty 2
  Filled 2022-07-08 (×3): qty 1

## 2022-07-08 NOTE — Assessment & Plan Note (Signed)
Hemoglobin stable 

## 2022-07-08 NOTE — Assessment & Plan Note (Signed)
Continue Synthroid °

## 2022-07-08 NOTE — Consult Note (Addendum)
Reason for Consult: Right periprosthetic femur fracture Referring Physician: EDP  HPI: Janet Mccarthy is an 87 y.o. female known to [facility], patient of Dr. Gaynelle Arabian, status post a left total hip arthroplasty to be performed for her approximately a year ago.  She has also been followed for chronic right hip and thigh pain related to a total hip arthroplasty which was placed 25 years ago.  There has been some discussion about the possible need for a revision surgery.  She was seen in the office last week with complaints of hip and thigh pain.  She was at home earlier today pivoting on the right leg when the leg gave way with immediate complaints of pain and inability to bear weight.  She is brought by EMS to the EDP.  Admission pending to the hospitalist service.  She has otherwise been in her normal state of health.  Past Medical History:  Diagnosis Date   Anxiety    Arthritis    right hip   COPD (chronic obstructive pulmonary disease) (HCC)    mild   Depression    Diverticular disease    sigmoid   Diverticulitis    Diverticulosis    Gallstones    GERD (gastroesophageal reflux disease)    Hyperlipidemia    Hyperparathyroidism ([place])    Hypothyroidism    IFG (impaired fasting glucose)    Multiple lipomas    Osteoarthritis    Osteopenia    Retroperitoneal mass    Skin cancer    nose   Vitamin D deficiency     Past Surgical History:  Procedure Laterality Date   COLON RESECTION  2010   with tumor removal   COLONOSCOPY WITH ESOPHAGOGASTRODUODENOSCOPY (EGD)  06/2018   TONSILLECTOMY AND ADENOIDECTOMY     TOTAL HIP ARTHROPLASTY  2004   right   TOTAL HIP ARTHROPLASTY Left 06/01/2021   Procedure: TOTAL HIP ARTHROPLASTY ANTERIOR APPROACH;  Surgeon: Gaynelle Arabian, MD;  Location: [facility];  Service: Orthopedics;  Laterality: Left;    Family History  Adopted: Yes    Social History:  reports that she quit smoking about 50 years ago. Her smoking use included cigarettes. She  has a 30.00 pack-year smoking history. She has never used smokeless tobacco. She reports that she does not drink alcohol and does not use drugs.  Allergies:  Allergies  Allergen Reactions   Aciphex [Rabeprazole] Swelling and Other (See Comments)    Lip redness and swelling   Anoro Ellipta [Umeclidinium-Vilanterol] Other (See Comments)    Urinary frequency     Omeprazole Other (See Comments)    Abdominal pain   Sulfa Antibiotics Itching and Nausea And Vomiting    Medications: I have reviewed the patient's current medications.  Results for orders placed or performed during the hospital encounter of 07/08/22 (from the past 48 hour(s))  CBC with Differential     Status: Abnormal   Collection Time: 07/08/22  4:54 PM  Result Value Ref Range   WBC 11.5 (H) 4.0 - 10.5 K/uL   RBC 3.59 (L) 3.87 - 5.11 MIL/uL   Hemoglobin 10.6 (L) 12.0 - 15.0 g/dL   HCT 35.3 (L) 36.0 - 46.0 %   MCV 98.3 80.0 - 100.0 fL   MCH 29.5 26.0 - 34.0 pg   MCHC 30.0 30.0 - 36.0 g/dL   RDW 15.5 11.5 - 15.5 %   Platelets  150 - 400 K/uL    PLATELET CLUMPS NOTED ON SMEAR, COUNT APPEARS DECREASED    Comment: SPECIMEN  CHECKED FOR CLOTS REPEATED TO VERIFY    nRBC 0.0 0.0 - 0.2 %   Neutrophils Relative % 79 %   Neutro Abs 9.0 (H) 1.7 - 7.7 K/uL   Lymphocytes Relative 8 %   Lymphs Abs 1.0 0.7 - 4.0 K/uL   Monocytes Relative 11 %   Monocytes Absolute 1.3 (H) 0.1 - 1.0 K/uL   Eosinophils Relative 1 %   Eosinophils Absolute 0.1 0.0 - 0.5 K/uL   Basophils Relative 0 %   Basophils Absolute 0.0 0.0 - 0.1 K/uL   Immature Granulocytes 1 %   Abs Immature Granulocytes 0.06 0.00 - 0.07 K/uL    Comment: Performed at [facility], [place] [address], [place], [place] [address]  Protime-INR     Status: None   Collection Time: 07/08/22  4:54 PM  Result Value Ref Range   Prothrombin Time 13.7 11.4 - 15.2 seconds   INR 1.1 0.8 - 1.2    Comment: (NOTE) INR goal varies based on device and disease  states. Performed at [facility], [place] [address], [place], [place] [address]     VAS [place] LOWER EXTREMITY VENOUS (DVT) (7a-7p)  Result Date: 07/08/2022  Lower Venous DVT Study Patient Name:  MILAH RECHT  Date of Exam:   07/08/2022 Medical Rec #: [id number]            Accession #:    [id number] Date of Birth: [birth year]            Patient Gender: F Patient Age:   41 years Exam Location:  [facility] Procedure:      VAS [place] LOWER EXTREMITY VENOUS (DVT) Referring Phys: Jerene Pitch SMALL --------------------------------------------------------------------------------  Indications: Swelling, and Edema.  Comparison Study: no prior Performing Technologist: Archie Patten RVS  Examination Guidelines: A complete evaluation includes B-mode imaging, spectral Doppler, color Doppler, and power Doppler as needed of all accessible portions of each vessel. Bilateral testing is considered an integral part of a complete examination. Limited examinations for reoccurring indications may be performed as noted. The reflux portion of the exam is performed with the patient in reverse Trendelenburg.  +---------+---------------+---------+-----------+----------+--------------+ RIGHT    CompressibilityPhasicitySpontaneityPropertiesThrombus Aging +---------+---------------+---------+-----------+----------+--------------+ CFV      Full           Yes      Yes                                 +---------+---------------+---------+-----------+----------+--------------+ SFJ      Full                                                        +---------+---------------+---------+-----------+----------+--------------+ FV Prox  Full                                                        +---------+---------------+---------+-----------+----------+--------------+ FV Mid   Full                                                         +---------+---------------+---------+-----------+----------+--------------+  FV DistalFull                                                        +---------+---------------+---------+-----------+----------+--------------+ PFV      Full                                                        +---------+---------------+---------+-----------+----------+--------------+ POP      Full           Yes      Yes                                 +---------+---------------+---------+-----------+----------+--------------+ PTV      Full                                                        +---------+---------------+---------+-----------+----------+--------------+ PERO     Full                                                        +---------+---------------+---------+-----------+----------+--------------+   +----+---------------+---------+-----------+----------+--------------+ LEFTCompressibilityPhasicitySpontaneityPropertiesThrombus Aging +----+---------------+---------+-----------+----------+--------------+ CFV Full           Yes      Yes                                 +----+---------------+---------+-----------+----------+--------------+     Summary: RIGHT: - There is no evidence of deep vein thrombosis in the lower extremity.  - No cystic structure found in the popliteal fossa.  LEFT: - No evidence of common femoral vein obstruction.  *See table(s) above for measurements and observations. Electronically signed by Jamelle Haring on 07/08/2022 at 5:40:00 PM.    Final    DG Hip Unilat W or Wo Pelvis 2-3 Views Right  Result Date: 07/08/2022 CLINICAL DATA:  Fall.  Pain. EXAM: RIGHT KNEE - COMPLETE 4+ VIEW; DG HIP (WITH OR WITHOUT PELVIS) 2-3V RIGHT; RIGHT FEMUR 2 VIEWS COMPARISON:  CT abdomen and pelvis 01/28/2022, AP pelvis 06/01/2021 FINDINGS: Pelvis and right hip: Status post bilateral total hip arthroplasty. There is an acute periprosthetic fracture of the distal aspect of the  right femoral stem with a transverse fracture extending through the lateral cortex at approximately 2/3 of the length of the stem and an oblique fracture extending through the medial femoral diaphyseal cortex close to the tip of the stem. The fracture lines extend through the anterior greater than posterior cortices on lateral view. No displacement. The left femoral stem tip is not imaged, however no left hip arthroplasty hardware malfunction is seen. Surgical clips overlie the mid upper pelvis. Right femur: On frontal views of the right hip no significant lateral fracture displacement is seen, however on the frontal view of the  distal right femur, there is moderate lateral apex angulation of the distal femoral stem periprosthetic fracture with up to approximately 1.522.5 cm craniocaudal diastasis of the lateral fracture components. Right knee: Moderate medial and mild lateral compartment joint space narrowing. Medial greater than lateral compartment mild chondrocalcinosis. Minimal chronic enthesopathic change at the quadriceps and patellar tendon insertions on the patella. No joint effusion. No acute fracture or dislocation. Moderate to severe atherosclerotic calcifications. IMPRESSION: 1. Acute periprosthetic fracture of the distal aspect of the right femoral stem. Normal alignment is seen of the fracture on frontal lateral views of the hip, however on frontal view of the distal right femur there is moderate lateral apex angulation of the distal femoral stem periprosthetic fracture, likely introduced from leg movement and changes in positioning. 2. Moderate medial and mild lateral compartment osteoarthritis of the right knee. Electronically Signed   By: Yvonne Kendall M.D.   On: 07/08/2022 16:12   DG Femur Min 2 Views Right  Result Date: 07/08/2022 CLINICAL DATA:  Fall.  Pain. EXAM: RIGHT KNEE - COMPLETE 4+ VIEW; DG HIP (WITH OR WITHOUT PELVIS) 2-3V RIGHT; RIGHT FEMUR 2 VIEWS COMPARISON:  CT abdomen and pelvis  01/28/2022, AP pelvis 06/01/2021 FINDINGS: Pelvis and right hip: Status post bilateral total hip arthroplasty. There is an acute periprosthetic fracture of the distal aspect of the right femoral stem with a transverse fracture extending through the lateral cortex at approximately 2/3 of the length of the stem and an oblique fracture extending through the medial femoral diaphyseal cortex close to the tip of the stem. The fracture lines extend through the anterior greater than posterior cortices on lateral view. No displacement. The left femoral stem tip is not imaged, however no left hip arthroplasty hardware malfunction is seen. Surgical clips overlie the mid upper pelvis. Right femur: On frontal views of the right hip no significant lateral fracture displacement is seen, however on the frontal view of the distal right femur, there is moderate lateral apex angulation of the distal femoral stem periprosthetic fracture with up to approximately 1.522.5 cm craniocaudal diastasis of the lateral fracture components. Right knee: Moderate medial and mild lateral compartment joint space narrowing. Medial greater than lateral compartment mild chondrocalcinosis. Minimal chronic enthesopathic change at the quadriceps and patellar tendon insertions on the patella. No joint effusion. No acute fracture or dislocation. Moderate to severe atherosclerotic calcifications. IMPRESSION: 1. Acute periprosthetic fracture of the distal aspect of the right femoral stem. Normal alignment is seen of the fracture on frontal lateral views of the hip, however on frontal view of the distal right femur there is moderate lateral apex angulation of the distal femoral stem periprosthetic fracture, likely introduced from leg movement and changes in positioning. 2. Moderate medial and mild lateral compartment osteoarthritis of the right knee. Electronically Signed   By: Yvonne Kendall M.D.   On: 07/08/2022 16:12   DG Knee Complete 4 Views  Right  Result Date: 07/08/2022 CLINICAL DATA:  Fall.  Pain. EXAM: RIGHT KNEE - COMPLETE 4+ VIEW; DG HIP (WITH OR WITHOUT PELVIS) 2-3V RIGHT; RIGHT FEMUR 2 VIEWS COMPARISON:  CT abdomen and pelvis 01/28/2022, AP pelvis 06/01/2021 FINDINGS: Pelvis and right hip: Status post bilateral total hip arthroplasty. There is an acute periprosthetic fracture of the distal aspect of the right femoral stem with a transverse fracture extending through the lateral cortex at approximately 2/3 of the length of the stem and an oblique fracture extending through the medial femoral diaphyseal cortex close to the tip of the  stem. The fracture lines extend through the anterior greater than posterior cortices on lateral view. No displacement. The left femoral stem tip is not imaged, however no left hip arthroplasty hardware malfunction is seen. Surgical clips overlie the mid upper pelvis. Right femur: On frontal views of the right hip no significant lateral fracture displacement is seen, however on the frontal view of the distal right femur, there is moderate lateral apex angulation of the distal femoral stem periprosthetic fracture with up to approximately 1.522.5 cm craniocaudal diastasis of the lateral fracture components. Right knee: Moderate medial and mild lateral compartment joint space narrowing. Medial greater than lateral compartment mild chondrocalcinosis. Minimal chronic enthesopathic change at the quadriceps and patellar tendon insertions on the patella. No joint effusion. No acute fracture or dislocation. Moderate to severe atherosclerotic calcifications. IMPRESSION: 1. Acute periprosthetic fracture of the distal aspect of the right femoral stem. Normal alignment is seen of the fracture on frontal lateral views of the hip, however on frontal view of the distal right femur there is moderate lateral apex angulation of the distal femoral stem periprosthetic fracture, likely introduced from leg movement and changes in  positioning. 2. Moderate medial and mild lateral compartment osteoarthritis of the right knee. Electronically Signed   By: Yvonne Kendall M.D.   On: 07/08/2022 16:12     Vitals Temp:  [99.1 F (37.3 C)-99.3 F (37.4 C)] 99.1 F (37.3 C) (12/07 1618) Pulse Rate:  [98-107] 107 (12/07 1618) Resp:  [16] 16 (12/07 1618) BP: (151-159)/(66-75) 151/66 (12/07 1618) SpO2:  [97 %] 97 % (12/07 1618) There is no height or weight on file to calculate BMI.  Physical Exam: Patient is a well-developed, slender elderly female who is alert and oriented.  She denies headache neck pain or upper extremity complaints.  Left lower extremity shows no evidence for gross bone or joint instability.  Right lower extremity demonstrates diffuse swelling and tenderness with palpation about the hip and thigh.  Compartments are soft.  No obvious knee effusion or gross instability.  She has fair ankle dorsi and plantar flexor strength and is intact light touch sensation distally in the right lower extremity with minimal Swelling.  She has obvious extreme discomfort with any attempts at hip motion.   Radiographs  Plain films confirm a cemented right hip prosthesis with a fracture traversing the region of the distal stem with some moderate comminution and angulation.    Assessment/Plan: Impression:  1.  Acute right periprosthetic femur fracture.  Patient had been having chronic right hip and thigh pain likely secondary to some loosening of the original prosthesis that was put in over 25 years ago.  Plan:  I spoke with the patient as well as her husband and son regarding today's clinical findings.  We have briefly reviewed the various treatment options and the decision making process that will be necessary.  I will pass along her information to Dr. Reynaldo Minium first thing in the morning and defer to his expertise regarding definitive management of this nice lady's very difficult situation.  There is the possibility that surgery  could be tomorrow and as such please have the patient n.p.o. after midnight.  Continue strict nonweightbearing right lower extremity.  Bedrest.  Position right leg for comfort.  Harlyn Rathmann M Valia Wingard 07/08/2022, 6:43 PM  Contact # [phone]

## 2022-07-08 NOTE — Assessment & Plan Note (Signed)
Admitted with acute right periprosthetic femur fracture.  Seen by orthopedics, they will notify her primary orthopedist Dr. Wynelle Link to evaluate tomorrow for definitive management.  Based on RCRI, patient has a 3.9% 30-day risk of perioperative death, MI, or cardiac arrest. -Keep n.p.o. after midnight -Analgesics as needed -Type and screen, EKG

## 2022-07-08 NOTE — ED Provider Notes (Signed)
I received this patient handoff from previous PA proximal.  Please see her note for original workup and history.  In short patient is a 87 year old female who had a fall.  She is complaining of right hip, femur and knee pain.  She has been unable to lift her right leg.  X-ray pending.  Plan is for me to follow-up on x-ray and if x-ray is negative, likely CT as patient cannot range her joint.  Physical Exam  BP (!) 159/75   Pulse 98   Temp 99.3 F (37.4 C) (Oral)   Resp 16   SpO2 97%   Physical Exam Vitals and nursing note reviewed.  Constitutional:      Appearance: Normal appearance.  HENT:     Head: Normocephalic and atraumatic.  Eyes:     General: No scleral icterus.    Conjunctiva/sclera: Conjunctivae normal.  Pulmonary:     Effort: Pulmonary effort is normal. No respiratory distress.  Skin:    Findings: No rash.  Neurological:     Mental Status: She is alert.  Psychiatric:        Mood and Affect: Mood normal.     Procedures  Procedures  ED Course / MDM   Clinical Course as of 07/08/22 1816  Thu Jul 08, 5868  [id number] 87 year old female here with worsening right leg pain.  She has a known periprosthetic fracture that Dr. Maureen Ralphs has been monitoring.  She said it is worse after taking a shower and pivoting.  X-ray showing periprosthetic fracture.  Will review with orthopedics for recommendations. [MB]    Clinical Course User Index [MB] Hayden Rasmussen, MD   Medical Decision Making Amount and/or Complexity of Data Reviewed Labs: ordered. Radiology: ordered.  Risk Decision regarding hospitalization.    Radiology viewed by me.  I agree w radiology that patient has periprosthetic femur.  I spoke with Dr. Onnie Graham who recommended admission to the hospital and they will write a note.  Dr. Maureen Ralphs who she is seen in the past is on-call tomorrow.  Patient is agreeable to this.   Admit to Dr. Posey Pronto.     Darliss Ridgel 07/08/22 1830    Hayden Rasmussen,  MD 07/09/22 1000

## 2022-07-08 NOTE — Hospital Course (Signed)
Janet Mccarthy is a 87 y.o. female with medical history significant for CKD stage IIIa, hypothyroidism, HLD, depression/anxiety, normocytic anemia, s/p bilateral THA who is admitted with an acute right periprosthetic distal femoral fracture.

## 2022-07-08 NOTE — Assessment & Plan Note (Signed)
Review of labs shows that patient has borderline CKD stage IIIa-IIIb with baseline EGFR 42-48.  Renal function relatively stable.

## 2022-07-08 NOTE — Progress Notes (Signed)
Lower extremity venous has been completed.   Preliminary results in CV Proc.   Janet Mccarthy 07/08/2022 2:29 PM

## 2022-07-08 NOTE — ED Triage Notes (Signed)
Patient here from home reporting right leg pain near femur. Twisted leg in shower this morning up to hip. Difficulty ambulating.

## 2022-07-08 NOTE — H&P (Signed)
History and Physical    AIYANNA Mccarthy ERX:[id number] DOB: Nov 11, 1934 DOA: 07/08/2022  PCP: Crist Infante, MD  Patient coming from: Home  I have personally briefly reviewed patient's old medical records in [place]  Chief Complaint: Right hip pain  HPI: Janet Mccarthy is a 87 y.o. female with medical history significant for CKD stage IIIa, hypothyroidism, HLD, depression/anxiety, normocytic anemia, s/p bilateral THA who presented to the ED for evaluation of right hip pain.  Patient states that she has been dealing with chronic right hip pain.  This has been felt secondary to some loosening of the original prosthesis that was put in over 25 years ago.  Patient states while showering today she twisted her right leg and developed acutely worsening pain to the area.  She was unable to bear weight.  EMS were called and she was brought to the ED for further evaluation.  She otherwise denies any chest pain, dyspnea, nausea, vomiting, abdominal pain, dysuria.  ED Course  Labs/Imaging on admission: I have personally reviewed following labs and imaging studies.  Initial vitals showed BP 159/75, pulse 98, RR 16, temp 99.3 F, SpO2 97% on room air.  Labs show WBC 11.5, hemoglobin 10.6, platelets not reported due to clumps noted on smear.  Sodium 137, potassium 4.0, bicarb 23, BUN 26, creatinine 1.23, serum glucose 112.  Type and screen in process.  Right hip, femur, knee x-rays show acute periprosthetic fracture of the distal aspect of the right femoral stem.  [facility] consulted.  Patient seen by Dr. Onnie Graham who will notify patient's primary orthopedic Dr. Wynelle Link for evaluation tomorrow morning.  The hospitalist service was consulted to admit for further evaluation management.  Review of Systems: All systems reviewed and are negative except as documented in history of present illness above.   Past Medical History:  Diagnosis Date   Anxiety    Arthritis    right hip    COPD (chronic obstructive pulmonary disease) (HCC)    mild   Depression    Diverticular disease    sigmoid   Diverticulitis    Diverticulosis    Gallstones    GERD (gastroesophageal reflux disease)    Hyperlipidemia    Hyperparathyroidism ([place])    Hypothyroidism    IFG (impaired fasting glucose)    Multiple lipomas    Osteoarthritis    Osteopenia    Retroperitoneal mass    Skin cancer    nose   Vitamin D deficiency     Past Surgical History:  Procedure Laterality Date   COLON RESECTION  2010   with tumor removal   COLONOSCOPY WITH ESOPHAGOGASTRODUODENOSCOPY (EGD)  06/2018   TONSILLECTOMY AND ADENOIDECTOMY     TOTAL HIP ARTHROPLASTY  2004   right   TOTAL HIP ARTHROPLASTY Left 06/01/2021   Procedure: TOTAL HIP ARTHROPLASTY ANTERIOR APPROACH;  Surgeon: Gaynelle Arabian, MD;  Location: [facility];  Service: Orthopedics;  Laterality: Left;    Social History:  reports that she quit smoking about 50 years ago. Her smoking use included cigarettes. She has a 30.00 pack-year smoking history. She has never used smokeless tobacco. She reports that she does not drink alcohol and does not use drugs.  Allergies  Allergen Reactions   Aciphex [Rabeprazole] Swelling and Other (See Comments)    Lip redness and swelling   Anoro Ellipta [Umeclidinium-Vilanterol] Other (See Comments)    Urinary frequency     Omeprazole Other (See Comments)    Abdominal pain   Sulfa Antibiotics Itching and  Nausea And Vomiting    Family History  Adopted: Yes     Prior to Admission medications   Medication Sig Start Date End Date Taking? Authorizing Provider  acetaminophen (TYLENOL) 500 MG tablet Take 500 mg by mouth every 6 (six) hours as needed for fever or moderate pain.    [provider]  amitriptyline (ELAVIL) 50 MG tablet Take 50 mg by mouth at bedtime. 02/23/17   [provider]  clobetasol cream (TEMOVATE) 6.54 % Apply 1 application topically daily as needed (irritation). 09/23/14    [provider]  ferrous sulfate 325 (65 FE) MG tablet Take 1 tablet (325 mg total) by mouth daily with breakfast. 02/16/22   Charlynne Cousins, MD  levothyroxine (SYNTHROID) 75 MCG tablet Take 75 mcg by mouth daily before breakfast. 02/20/21   [provider]  methocarbamol (ROBAXIN) 500 MG tablet one tablet Orally up to every 8 hours as needed for muscle pain or spasm for 30 days    [provider]  pantoprazole (PROTONIX) 40 MG tablet Take 1 tablet (40 mg total) by mouth daily. Office visit for further refills Patient taking differently: Take 40 mg by mouth every morning. 11/19/21   Vladimir Crofts, PA-C  Saccharomyces boulardii (FLORASTOR PO) Take 500 mg by mouth 2 (two) times daily.    [provider]  sodium chloride (OCEAN) 0.65 % SOLN nasal spray Place 1 spray into both nostrils daily.    [provider]    Physical Exam: Vitals:   07/08/22 1313 07/08/22 1618  BP: (!) 159/75 (!) 151/66  Pulse: 98 (!) 107  Resp: 16 16  Temp: 99.3 F (37.4 C) 99.1 F (37.3 C)  TempSrc: Oral Oral  SpO2: 97% 97%   Constitutional: Resting supine in bed.  NAD, calm, comfortable Eyes: EOMI, lids and conjunctivae normal ENMT: Mucous membranes are moist. Posterior pharynx clear of any exudate or lesions.Normal dentition.  Neck: normal, supple, no masses. Respiratory: clear to auscultation anteriorly.  Normal respiratory effort. No accessory muscle use.  Cardiovascular: Regular rate and rhythm, no murmurs / rubs / gallops. No extremity edema. 2+ pedal pulses. Abdomen: no tenderness, no masses palpated. No hepatosplenomegaly. Bowel sounds positive.  Musculoskeletal: RLE shortened, ROM diminished due to periprosthetic hip fracture. Normal muscle tone.  Skin: no rashes, lesions, ulcers. No induration Neurologic: Sensation intact. Strength diminished RLE due to periprosthetic hip fracture otherwise 5/5 in all other extremities. Psychiatric:  Alert and  oriented x 3. Normal mood.   EKG: Ordered and pending.  Assessment/Plan Principal Problem:   Periprosthetic fracture around internal prosthetic hip joint, initial encounter Active Problems:   Chronic kidney disease, stage 3a ([place])   Hypothyroidism   Normocytic anemia   BAYLE CALVO is a 87 y.o. female with medical history significant for CKD stage IIIa, hypothyroidism, HLD, depression/anxiety, normocytic anemia, s/p bilateral THA who is admitted with an acute right periprosthetic distal femoral fracture.  Assessment and Plan: * Periprosthetic fracture around internal prosthetic hip joint, initial encounter Admitted with acute right periprosthetic femur fracture.  Seen by orthopedics, they will notify her primary orthopedist Dr. Wynelle Link to evaluate tomorrow for definitive management.  Based on RCRI, patient has a 3.9% 30-day risk of perioperative death, MI, or cardiac arrest. -Keep n.p.o. after midnight -Analgesics as needed -Type and screen, EKG  Chronic kidney disease, stage 3a ([place]) Review of labs shows that patient has borderline CKD stage IIIa-IIIb with baseline EGFR 42-48.  Renal function relatively stable.  Normocytic anemia Hemoglobin stable.  Hypothyroidism Continue Synthroid.  DVT prophylaxis: SCDs Start: 07/08/22 2003 Code Status: DNR, discussed with patient on admission.  She does want to continue to think about this and will notify if she wants to change CODE STATUS. Family Communication: Husband at bedside Disposition Plan: From home, dispo pending clinical progress Consults called: Orthopedic Severity of Illness: The appropriate patient status for this patient is INPATIENT. Inpatient status is judged to be reasonable and necessary in order to provide the required intensity of service to ensure the patient's safety. The patient's presenting symptoms, physical exam findings, and initial radiographic and laboratory data in the context of their chronic  comorbidities is felt to place them at high risk for further clinical deterioration. Furthermore, it is not anticipated that the patient will be medically stable for discharge from the hospital within 2 midnights of admission.   * I certify that at the point of admission it is my clinical judgment that the patient will require inpatient hospital care spanning beyond 2 midnights from the point of admission due to high intensity of service, high risk for further deterioration and high frequency of surveillance required.Zada Finders MD [facility]  If 7PM-7AM, please contact night-coverage [email or web address]  07/08/2022, 9:15 PM

## 2022-07-08 NOTE — ED Notes (Signed)
Dr. Onnie Graham ortho arrives at [facility] to speak with pt and family.

## 2022-07-08 NOTE — ED Notes (Signed)
Patient given ham sandwich and orange juice per request.

## 2022-07-08 NOTE — ED Provider Notes (Signed)
[facility] DEPT Provider Note   CSN: [id number] Arrival date & time: 07/08/22  1303     History  Chief Complaint  Patient presents with   Leg Pain    Janet Mccarthy is a 87 y.o. female, history of right hip prosthesis, who presents to the ED secondary to twisting her right hip in the bathroom today.  She states that she was trying to catch herself from falling, and twisted her right hip, and heard a snap and states that she had some pain.  Has not been able to bear weight since then.  Notes that she does have an old femoral Afaq fracture, she is being seen for right now, however states that this pain is new because she can no longer bear weight.  Denies any trauma to her head, or any other pain.  No rashes does state that she has had some right lower extremity swelling for the last few days, denies any change in color of skin.  No numbness or tingling  Home Medications Prior to Admission medications   Medication Sig Start Date End Date Taking? Authorizing Provider  acetaminophen (TYLENOL) 500 MG tablet Take 500 mg by mouth every 6 (six) hours as needed for fever or moderate pain.    [provider]  amitriptyline (ELAVIL) 50 MG tablet Take 50 mg by mouth at bedtime. 02/23/17   [provider]  clobetasol cream (TEMOVATE) 7.89 % Apply 1 application topically daily as needed (irritation). 09/23/14   [provider]  ferrous sulfate 325 (65 FE) MG tablet Take 1 tablet (325 mg total) by mouth daily with breakfast. 02/16/22   Charlynne Cousins, MD  levothyroxine (SYNTHROID) 75 MCG tablet Take 75 mcg by mouth daily before breakfast. 02/20/21   [provider]  pantoprazole (PROTONIX) 40 MG tablet Take 1 tablet (40 mg total) by mouth daily. Office visit for further refills Patient taking differently: Take 40 mg by mouth every morning. 11/19/21   Vladimir Crofts, PA-C  Saccharomyces boulardii (FLORASTOR PO) Take 500 mg by  mouth 2 (two) times daily.    [provider]  sodium chloride (OCEAN) 0.65 % SOLN nasal spray Place 1 spray into both nostrils daily.    [provider]      Allergies    Omeprazole, Rabeprazole, and Sulfa antibiotics    Review of Systems   Review of Systems  Musculoskeletal:        +R hip/femur pain  Skin:  Negative for color change and rash.    Physical Exam Updated Vital Signs BP (!) 159/75   Pulse 98   Temp 99.3 F (37.4 C) (Oral)   Resp 16   SpO2 97%  Physical Exam Vitals and nursing note reviewed.  Constitutional:      General: She is not in acute distress.    Appearance: She is well-developed.  HENT:     Head: Normocephalic and atraumatic.  Eyes:     Conjunctiva/sclera: Conjunctivae normal.  Cardiovascular:     Rate and Rhythm: Normal rate and regular rhythm.     Pulses: Normal pulses.  Pulmonary:     Effort: Pulmonary effort is normal. No respiratory distress.     Breath sounds: Normal breath sounds.  Abdominal:     Palpations: Abdomen is soft.     Tenderness: There is no abdominal tenderness.  Musculoskeletal:     Cervical back: Neck supple.     Comments: +TTP of R femoral neck, R lateral hip,  and midshaft femur. 1+ pitting edema of RLE. Unable to lift R leg. +TTP of prepatellar knee.   Skin:    General: Skin is warm and dry.     Capillary Refill: Capillary refill takes less than 2 seconds.  Neurological:     Mental Status: She is alert.  Psychiatric:        Mood and Affect: Mood normal.     ED Results / Procedures / Treatments   Labs (all labs ordered are listed, but only abnormal results are displayed) Labs Reviewed - No data to display  EKG None  Radiology VAS [place] LOWER EXTREMITY VENOUS (DVT) (7a-7p)  Result Date: 07/08/2022  Lower Venous DVT Study Patient Name:  Janet Mccarthy  Date of Exam:   07/08/2022 Medical Rec #: [id number]            Accession #:    [id number] Date of Birth: [birth year]            Patient Gender: F  Patient Age:   41 years Exam Location:  [facility] Procedure:      VAS [place] LOWER EXTREMITY VENOUS (DVT) Referring Phys: Jerene Pitch Dearion Huot --------------------------------------------------------------------------------  Indications: Swelling, and Edema.  Comparison Study: no prior Performing Technologist: Archie Patten RVS  Examination Guidelines: A complete evaluation includes B-mode imaging, spectral Doppler, color Doppler, and power Doppler as needed of all accessible portions of each vessel. Bilateral testing is considered an integral part of a complete examination. Limited examinations for reoccurring indications may be performed as noted. The reflux portion of the exam is performed with the patient in reverse Trendelenburg.  +---------+---------------+---------+-----------+----------+--------------+ RIGHT    CompressibilityPhasicitySpontaneityPropertiesThrombus Aging +---------+---------------+---------+-----------+----------+--------------+ CFV      Full           Yes      Yes                                 +---------+---------------+---------+-----------+----------+--------------+ SFJ      Full                                                        +---------+---------------+---------+-----------+----------+--------------+ FV Prox  Full                                                        +---------+---------------+---------+-----------+----------+--------------+ FV Mid   Full                                                        +---------+---------------+---------+-----------+----------+--------------+ FV DistalFull                                                        +---------+---------------+---------+-----------+----------+--------------+ PFV      Full                                                        +---------+---------------+---------+-----------+----------+--------------+  POP      Full           Yes      Yes                                  +---------+---------------+---------+-----------+----------+--------------+ PTV      Full                                                        +---------+---------------+---------+-----------+----------+--------------+ PERO     Full                                                        +---------+---------------+---------+-----------+----------+--------------+   +----+---------------+---------+-----------+----------+--------------+ LEFTCompressibilityPhasicitySpontaneityPropertiesThrombus Aging +----+---------------+---------+-----------+----------+--------------+ CFV Full           Yes      Yes                                 +----+---------------+---------+-----------+----------+--------------+     Summary: RIGHT: - There is no evidence of deep vein thrombosis in the lower extremity.  - No cystic structure found in the popliteal fossa.  LEFT: - No evidence of common femoral vein obstruction.  *See table(s) above for measurements and observations.    Preliminary     Procedures Procedures    Medications Ordered in ED Medications - No data to display  ED Course/ Medical Decision Making/ A&P                           Medical Decision Making Patient is 87 year old female, here for hip/femur pain that occurred after falling/twisting the wrong way in her bathroom today.  She denies any head trauma just states that she cannot walk anymore.  She is not in much pain just is unable to walk.  Typically walks by herself.  History of prosthetic hip.  Also reports right leg swelling for the last few days will obtain ultrasound secondary to right leg swelling, and x-rays to evaluate for fractures, if negative for fractures recommend CT.  Amount and/or Complexity of Data Reviewed Radiology: ordered. Discussion of management or test interpretation with external provider(s): Ultrasound negative for DVT, awaiting x-ray, handoff given to [facility] pending x-rays and  disposition.  If x-rays negative recommend CT given difficulty ambulating, and recent trauma.    Final Clinical Impression(s) / ED Diagnoses Final diagnoses:  Right leg pain    Rx / DC Orders ED Discharge Orders     None         Shelita Steptoe, Si Gaul, PA 07/08/22 1455    Lacretia Leigh, MD 07/09/22 [phone]

## 2022-07-09 ENCOUNTER — Encounter (HOSPITAL_COMMUNITY): Payer: Self-pay | Admitting: Internal Medicine

## 2022-07-09 ENCOUNTER — Other Ambulatory Visit: Payer: Self-pay

## 2022-07-09 ENCOUNTER — Inpatient Hospital Stay (HOSPITAL_COMMUNITY): Payer: Medicare Other | Admitting: Certified Registered Nurse Anesthetist

## 2022-07-09 ENCOUNTER — Inpatient Hospital Stay (HOSPITAL_COMMUNITY): Payer: Medicare Other

## 2022-07-09 ENCOUNTER — Encounter (HOSPITAL_COMMUNITY)
Admission: EM | Disposition: A | Discharge status: Home Health | Payer: Self-pay | Source: Home / Self Care | Attending: Internal Medicine

## 2022-07-09 DIAGNOSIS — J449 Chronic obstructive pulmonary disease, unspecified: Secondary | ICD-10-CM

## 2022-07-09 DIAGNOSIS — Z87891 Personal history of nicotine dependence: Secondary | ICD-10-CM

## 2022-07-09 DIAGNOSIS — Z96649 Presence of unspecified artificial hip joint: Secondary | ICD-10-CM | POA: Diagnosis not present

## 2022-07-09 DIAGNOSIS — I10 Essential (primary) hypertension: Secondary | ICD-10-CM

## 2022-07-09 DIAGNOSIS — F418 Other specified anxiety disorders: Secondary | ICD-10-CM

## 2022-07-09 DIAGNOSIS — M978XXA Periprosthetic fracture around other internal prosthetic joint, initial encounter: Secondary | ICD-10-CM | POA: Diagnosis not present

## 2022-07-09 DIAGNOSIS — M9702XA Periprosthetic fracture around internal prosthetic left hip joint, initial encounter: Secondary | ICD-10-CM

## 2022-07-09 HISTORY — PX: TOTAL HIP ARTHROPLASTY: SHX124

## 2022-07-09 LAB — CBC
HCT: 32.5 % — ABNORMAL LOW (ref 36.0–46.0)
Hemoglobin: 10.1 g/dL — ABNORMAL LOW (ref 12.0–15.0)
MCH: 30.4 pg (ref 26.0–34.0)
MCHC: 31.1 g/dL (ref 30.0–36.0)
MCV: 97.9 fL (ref 80.0–100.0)
Platelets: 261 10*3/uL (ref 150–400)
RBC: 3.32 MIL/uL — ABNORMAL LOW (ref 3.87–5.11)
RDW: 15.5 % (ref 11.5–15.5)
WBC: 9.9 10*3/uL (ref 4.0–10.5)
nRBC: 0 % (ref 0.0–0.2)

## 2022-07-09 LAB — BASIC METABOLIC PANEL
Anion gap: 9 (ref 5–15)
BUN: 23 mg/dL (ref 8–23)
CO2: 22 mmol/L (ref 22–32)
Calcium: 9.7 mg/dL (ref 8.9–10.3)
Chloride: 104 mmol/L (ref 98–111)
Creatinine, Ser: 1.22 mg/dL — ABNORMAL HIGH (ref 0.44–1.00)
GFR, Estimated: 43 mL/min — ABNORMAL LOW (ref >60)
Glucose, Bld: 102 mg/dL — ABNORMAL HIGH (ref 70–99)
Potassium: 3.7 mmol/L (ref 3.5–5.1)
Sodium: 135 mmol/L (ref 135–145)

## 2022-07-09 LAB — SURGICAL PCR SCREEN
MRSA, PCR: NEGATIVE
Staphylococcus aureus: NEGATIVE

## 2022-07-09 SURGERY — ARTHROPLASTY, HIP, TOTAL,POSTERIOR APPROACH
Anesthesia: General | Site: Hip | Laterality: Right

## 2022-07-09 MED ORDER — PROPOFOL 10 MG/ML IV BOLUS
INTRAVENOUS | Status: AC
  Filled 2022-07-09: qty 20

## 2022-07-09 MED ORDER — ACETAMINOPHEN 325 MG PO TABS: 325.0000 mg | ORAL_TABLET | Freq: Four times a day (QID) | ORAL | Status: DC | PRN

## 2022-07-09 MED ORDER — ROCURONIUM BROMIDE 100 MG/10ML IV SOLN
INTRAVENOUS | Status: DC | PRN
  Administered 2022-07-09: 50 mg via INTRAVENOUS

## 2022-07-09 MED ORDER — APIXABAN 2.5 MG PO TABS
2.5000 mg | ORAL_TABLET | Freq: Two times a day (BID) | ORAL | Status: DC
  Administered 2022-07-10 – 2022-07-12 (×5): 2.5 mg via ORAL
  Filled 2022-07-09 (×5): qty 1

## 2022-07-09 MED ORDER — ONDANSETRON HCL 4 MG/2ML IJ SOLN
INTRAMUSCULAR | Status: AC
  Filled 2022-07-09: qty 2

## 2022-07-09 MED ORDER — METHOCARBAMOL 500 MG IVPB - SIMPLE MED: 500.0000 mg | Freq: Four times a day (QID) | INTRAVENOUS | Status: DC | PRN

## 2022-07-09 MED ORDER — LIDOCAINE HCL (PF) 2 % IJ SOLN
INTRAMUSCULAR | Status: AC
  Filled 2022-07-09: qty 5

## 2022-07-09 MED ORDER — SODIUM CHLORIDE 0.9 % IV SOLN: INTRAVENOUS | Status: DC

## 2022-07-09 MED ORDER — DEXAMETHASONE SODIUM PHOSPHATE 10 MG/ML IJ SOLN
INTRAMUSCULAR | Status: DC | PRN
  Administered 2022-07-09: 4 mg via INTRAVENOUS

## 2022-07-09 MED ORDER — PROPOFOL 10 MG/ML IV BOLUS
INTRAVENOUS | Status: DC | PRN
  Administered 2022-07-09: 100 mg via INTRAVENOUS

## 2022-07-09 MED ORDER — DEXAMETHASONE SODIUM PHOSPHATE 10 MG/ML IJ SOLN
10.0000 mg | Freq: Once | INTRAMUSCULAR | Status: AC
  Administered 2022-07-10: 10 mg via INTRAVENOUS
  Filled 2022-07-09: qty 1

## 2022-07-09 MED ORDER — FENTANYL CITRATE (PF) 100 MCG/2ML IJ SOLN
INTRAMUSCULAR | Status: DC | PRN
  Administered 2022-07-09: 100 ug via INTRAVENOUS
  Administered 2022-07-09 (×2): 50 ug via INTRAVENOUS

## 2022-07-09 MED ORDER — ONDANSETRON HCL 4 MG/2ML IJ SOLN
INTRAMUSCULAR | Status: DC | PRN
  Administered 2022-07-09: 4 mg via INTRAVENOUS

## 2022-07-09 MED ORDER — ONDANSETRON HCL 4 MG/2ML IJ SOLN: 4.0000 mg | Freq: Four times a day (QID) | INTRAMUSCULAR | Status: DC | PRN

## 2022-07-09 MED ORDER — SUGAMMADEX SODIUM 500 MG/5ML IV SOLN
INTRAVENOUS | Status: DC | PRN
  Administered 2022-07-09: 200 mg via INTRAVENOUS

## 2022-07-09 MED ORDER — ROCURONIUM BROMIDE 10 MG/ML (PF) SYRINGE
PREFILLED_SYRINGE | INTRAVENOUS | Status: AC
  Filled 2022-07-09: qty 10

## 2022-07-09 MED ORDER — HYDROCODONE-ACETAMINOPHEN 7.5-325 MG PO TABS: 1.0000 | ORAL_TABLET | ORAL | Status: DC | PRN

## 2022-07-09 MED ORDER — FENTANYL CITRATE (PF) 100 MCG/2ML IJ SOLN
INTRAMUSCULAR | Status: AC
  Filled 2022-07-09: qty 2

## 2022-07-09 MED ORDER — OXYCODONE HCL 5 MG/5ML PO SOLN: 5.0000 mg | Freq: Once | ORAL | Status: DC | PRN

## 2022-07-09 MED ORDER — TRANEXAMIC ACID-NACL 1000-0.7 MG/100ML-% IV SOLN
INTRAVENOUS | Status: AC
  Filled 2022-07-09: qty 100

## 2022-07-09 MED ORDER — METOCLOPRAMIDE HCL 5 MG PO TABS: 5.0000 mg | ORAL_TABLET | Freq: Three times a day (TID) | ORAL | Status: DC | PRN

## 2022-07-09 MED ORDER — CEFAZOLIN SODIUM-DEXTROSE 2-4 GM/100ML-% IV SOLN
2.0000 g | Freq: Four times a day (QID) | INTRAVENOUS | Status: AC
  Administered 2022-07-09 – 2022-07-10 (×2): 2 g via INTRAVENOUS
  Filled 2022-07-09 (×2): qty 100

## 2022-07-09 MED ORDER — HYDROMORPHONE HCL 2 MG/ML IJ SOLN
INTRAMUSCULAR | Status: AC
  Filled 2022-07-09: qty 1

## 2022-07-09 MED ORDER — CEFAZOLIN SODIUM-DEXTROSE 2-4 GM/100ML-% IV SOLN
2.0000 g | INTRAVENOUS | Status: AC
  Administered 2022-07-09: 2 g via INTRAVENOUS
  Filled 2022-07-09 (×2): qty 100

## 2022-07-09 MED ORDER — ONDANSETRON HCL 4 MG PO TABS: 4.0000 mg | ORAL_TABLET | Freq: Four times a day (QID) | ORAL | Status: DC | PRN

## 2022-07-09 MED ORDER — TRANEXAMIC ACID-NACL 1000-0.7 MG/100ML-% IV SOLN
INTRAVENOUS | Status: DC | PRN
  Administered 2022-07-09: 1000 mg via INTRAVENOUS

## 2022-07-09 MED ORDER — POLYETHYLENE GLYCOL 3350 17 G PO PACK
17.0000 g | PACK | Freq: Every day | ORAL | Status: DC | PRN
  Filled 2022-07-09: qty 1

## 2022-07-09 MED ORDER — MORPHINE SULFATE (PF) 2 MG/ML IV SOLN: 0.5000 mg | INTRAVENOUS | Status: DC | PRN

## 2022-07-09 MED ORDER — PHENOL 1.4 % MT LIQD: 1.0000 | OROMUCOSAL | Status: DC | PRN

## 2022-07-09 MED ORDER — LACTATED RINGERS IV SOLN: INTRAVENOUS | Status: DC

## 2022-07-09 MED ORDER — MAGNESIUM CITRATE PO SOLN: 1.0000 | Freq: Once | ORAL | Status: DC | PRN

## 2022-07-09 MED ORDER — LIDOCAINE HCL (CARDIAC) PF 100 MG/5ML IV SOSY
PREFILLED_SYRINGE | INTRAVENOUS | Status: DC | PRN
  Administered 2022-07-09: 60 mg via INTRAVENOUS

## 2022-07-09 MED ORDER — MUPIROCIN 2 % EX OINT: 1.0000 | TOPICAL_OINTMENT | Freq: Two times a day (BID) | CUTANEOUS | Status: DC

## 2022-07-09 MED ORDER — METHOCARBAMOL 500 MG PO TABS: 500.0000 mg | ORAL_TABLET | Freq: Four times a day (QID) | ORAL | Status: DC | PRN

## 2022-07-09 MED ORDER — DEXAMETHASONE SODIUM PHOSPHATE 10 MG/ML IJ SOLN
INTRAMUSCULAR | Status: AC
  Filled 2022-07-09: qty 1

## 2022-07-09 MED ORDER — BUPIVACAINE HCL 0.25 % IJ SOLN
INTRAMUSCULAR | Status: AC
  Filled 2022-07-09: qty 1

## 2022-07-09 MED ORDER — HYDROCODONE-ACETAMINOPHEN 5-325 MG PO TABS
1.0000 | ORAL_TABLET | ORAL | Status: DC | PRN
  Administered 2022-07-10: 2 via ORAL
  Filled 2022-07-09: qty 2
  Filled 2022-07-09: qty 1

## 2022-07-09 MED ORDER — BISACODYL 10 MG RE SUPP
10.0000 mg | Freq: Every day | RECTAL | Status: DC | PRN
  Filled 2022-07-09: qty 1

## 2022-07-09 MED ORDER — METOPROLOL TARTRATE 5 MG/5ML IV SOLN
INTRAVENOUS | Status: DC | PRN
  Administered 2022-07-09: 2 mg via INTRAVENOUS

## 2022-07-09 MED ORDER — METOCLOPRAMIDE HCL 5 MG/ML IJ SOLN: 5.0000 mg | Freq: Three times a day (TID) | INTRAMUSCULAR | Status: DC | PRN

## 2022-07-09 MED ORDER — POVIDONE-IODINE 10 % EX SOLN
Freq: Once | CUTANEOUS | Status: DC
  Filled 2022-07-09: qty 118

## 2022-07-09 MED ORDER — DOCUSATE SODIUM 100 MG PO CAPS
100.0000 mg | ORAL_CAPSULE | Freq: Two times a day (BID) | ORAL | Status: DC
  Administered 2022-07-09 – 2022-07-12 (×6): 100 mg via ORAL
  Filled 2022-07-09 (×6): qty 1

## 2022-07-09 MED ORDER — MENTHOL 3 MG MT LOZG: 1.0000 | LOZENGE | OROMUCOSAL | Status: DC | PRN

## 2022-07-09 MED ORDER — 0.9 % SODIUM CHLORIDE (POUR BTL) OPTIME
TOPICAL | Status: DC | PRN
  Administered 2022-07-09: 1000 mL

## 2022-07-09 MED ORDER — FENTANYL CITRATE PF 50 MCG/ML IJ SOSY: 25.0000 ug | PREFILLED_SYRINGE | INTRAMUSCULAR | Status: DC | PRN

## 2022-07-09 MED ORDER — OXYCODONE HCL 5 MG PO TABS: 5.0000 mg | ORAL_TABLET | Freq: Once | ORAL | Status: DC | PRN

## 2022-07-09 MED ORDER — HYDROMORPHONE HCL 1 MG/ML IJ SOLN
INTRAMUSCULAR | Status: DC | PRN
  Administered 2022-07-09: 1 mg via INTRAVENOUS

## 2022-07-09 SURGICAL SUPPLY — 76 items
BAG COUNTER SPONGE SURGICOUNT (BAG) IMPLANT
BAG DECANTER FOR FLEXI CONT (MISCELLANEOUS) ×2 IMPLANT
BAG SPEC THK2 15X12 ZIP CLS (MISCELLANEOUS)
BAG SPNG CNTER NS LX DISP (BAG)
BAG ZIPLOCK 12X15 (MISCELLANEOUS) IMPLANT
BIT DRILL 2.8X128 (BIT) ×2 IMPLANT
BIT DRILL 4.3 (BIT) IMPLANT
BIT DRILL QC 3.3X195 (BIT) IMPLANT
BLADE EXTENDED COATED 6.5IN (ELECTRODE) ×2 IMPLANT
BLADE SAW SGTL 73X25 THK (BLADE) ×2 IMPLANT
BLADE SURG SZ10 CARB STEEL (BLADE) ×4 IMPLANT
CABLE CERLAGE W/CRIMP 1.8 (Cable) IMPLANT
CAP LOCK NCB (Cap) IMPLANT
COVER SURGICAL LIGHT HANDLE (MISCELLANEOUS) ×2 IMPLANT
DRAPE INCISE IOBAN 66X45 STRL (DRAPES) ×2 IMPLANT
DRAPE ORTHO SPLIT 77X108 STRL (DRAPES) ×2
DRAPE POUCH INSTRU U-SHP 10X18 (DRAPES) ×2 IMPLANT
DRAPE SURG ORHT 6 SPLT 77X108 (DRAPES) ×4 IMPLANT
DRAPE U-SHAPE 47X51 STRL (DRAPES) ×2 IMPLANT
DRESSING MEPILEX FLEX 4X4 (GAUZE/BANDAGES/DRESSINGS) IMPLANT
DRILL BIT 4.3 (BIT) ×1
DRSG AQUACEL AG ADV 3.5X10 (GAUZE/BANDAGES/DRESSINGS) ×2 IMPLANT
DRSG AQUACEL AG ADV 3.5X14 (GAUZE/BANDAGES/DRESSINGS) IMPLANT
DRSG MEPILEX FLEX 4X4 (GAUZE/BANDAGES/DRESSINGS)
DURAPREP 26ML APPLICATOR (WOUND CARE) ×2 IMPLANT
ELECT REM PT RETURN 15FT ADLT (MISCELLANEOUS) ×2 IMPLANT
EVACUATOR 1/8 PVC DRAIN (DRAIN) IMPLANT
FACESHIELD WRAPAROUND (MASK) ×4 IMPLANT
FACESHIELD WRAPAROUND OR TEAM (MASK) ×8 IMPLANT
GAUZE SPONGE 4X4 12PLY STRL (GAUZE/BANDAGES/DRESSINGS) ×2 IMPLANT
GLOVE BIO SURGEON STRL SZ 6.5 (GLOVE) ×4 IMPLANT
GLOVE BIO SURGEON STRL SZ7.5 (GLOVE) ×2 IMPLANT
GLOVE BIO SURGEON STRL SZ8 (GLOVE) ×2 IMPLANT
GLOVE BIOGEL PI IND STRL 6.5 (GLOVE) ×2 IMPLANT
GLOVE BIOGEL PI IND STRL 7.0 (GLOVE) ×2 IMPLANT
GLOVE BIOGEL PI IND STRL 8 (GLOVE) ×2 IMPLANT
GOWN STRL REUS W/ TWL LRG LVL3 (GOWN DISPOSABLE) ×2 IMPLANT
GOWN STRL REUS W/ TWL XL LVL3 (GOWN DISPOSABLE) IMPLANT
GOWN STRL REUS W/TWL LRG LVL3 (GOWN DISPOSABLE) ×1
GOWN STRL REUS W/TWL XL LVL3 (GOWN DISPOSABLE)
IMMOBILIZER KNEE 20 (SOFTGOODS) ×1 IMPLANT
IMMOBILIZER KNEE 20 THIGH 36 (SOFTGOODS) ×2 IMPLANT
K-WIRE 2.0 (WIRE) ×1
K-WIRE FXSTD 280X2XNS SS (WIRE) ×1
KIT BASIN OR (CUSTOM PROCEDURE TRAY) ×2 IMPLANT
KIT TURNOVER KIT A (KITS) IMPLANT
KWIRE FXSTD 280X2XNS SS (WIRE) IMPLANT
MANIFOLD NEPTUNE II (INSTRUMENTS) ×2 IMPLANT
NDL SAFETY ECLIP 18X1.5 (MISCELLANEOUS) ×4 IMPLANT
NS IRRIG 1000ML POUR BTL (IV SOLUTION) ×2 IMPLANT
PACK TOTAL JOINT (CUSTOM PROCEDURE TRAY) ×2 IMPLANT
PADDING CAST COTTON 6X4 STRL (CAST SUPPLIES) ×2 IMPLANT
PASSER SUT SWANSON 36MM LOOP (INSTRUMENTS) ×2 IMPLANT
PLATE FEMUR PROX NCB 9HOLE PP (Plate) IMPLANT
PROTECTOR NERVE ULNAR (MISCELLANEOUS) ×2 IMPLANT
SCREW 5.0 32MM (Screw) IMPLANT
SCREW CANN NCB PA 6.2X4.4/5 (Screw) IMPLANT
SCREW NCB 4.0 32MM (Screw) IMPLANT
SCREW NCB 4.0MX42M (Screw) IMPLANT
SCREW NCB 5.0X34MM (Screw) IMPLANT
SCREW UNICORT 5.0X18MM (Screw) ×1 IMPLANT
SCREW UNICORTICAL 5.0X18 (Screw) IMPLANT
SPIKE FLUID TRANSFER (MISCELLANEOUS) ×2 IMPLANT
STRIP CLOSURE SKIN 1/2X4 (GAUZE/BANDAGES/DRESSINGS) ×4 IMPLANT
SUT ETHIBOND NAB CT1 #1 30IN (SUTURE) ×4 IMPLANT
SUT MNCRL AB 4-0 PS2 18 (SUTURE) ×2 IMPLANT
SUT STRATAFIX 0 PDS 27 VIOLET (SUTURE) ×1
SUT VIC AB 2-0 CT1 27 (SUTURE) ×5
SUT VIC AB 2-0 CT1 TAPERPNT 27 (SUTURE) ×6 IMPLANT
SUTURE STRATFX 0 PDS 27 VIOLET (SUTURE) ×2 IMPLANT
SYR 20ML LL LF (SYRINGE) ×2 IMPLANT
SYR 50ML LL SCALE MARK (SYRINGE) IMPLANT
TOWEL OR 17X26 10 PK STRL BLUE (TOWEL DISPOSABLE) ×4 IMPLANT
TOWEL OR NON WOVEN STRL DISP B (DISPOSABLE) ×2 IMPLANT
TRAY FOLEY MTR SLVR 16FR STAT (SET/KITS/TRAYS/PACK) ×2 IMPLANT
WATER STERILE IRR 1000ML POUR (IV SOLUTION) ×2 IMPLANT

## 2022-07-09 NOTE — ED Notes (Signed)
Patient now requesting she would like to be a FULL CODE.  Hospitalist made aware.

## 2022-07-09 NOTE — Anesthesia Preprocedure Evaluation (Addendum)
Anesthesia Evaluation  Patient identified by MRN, date of birth, ID band Patient awake    Reviewed: Allergy & Precautions, H&P , NPO status , Patient's Chart, lab work & pertinent test results  History of Anesthesia Complications Negative for: history of anesthetic complications  Airway Mallampati: II   Neck ROM: full    Dental   Pulmonary COPD, former smoker   breath sounds clear to auscultation       Cardiovascular hypertension,  Rhythm:regular Rate:Normal     Neuro/Psych   Anxiety Depression    negative neurological ROS     GI/Hepatic Neg liver ROS,GERD  ,,  Endo/Other  Hypothyroidism    Renal/GU Renal InsufficiencyRenal disease (Cr 1.22)  negative genitourinary   Musculoskeletal  (+) Arthritis ,  periprosthetic femur fracture   Abdominal   Peds  Hematology  (+) Blood dyscrasia (Hgb 10.1, Plt 261k), anemia   Anesthesia Other Findings Day of surgery medications reviewed with patient.  Reproductive/Obstetrics negative OB ROS                             Anesthesia Physical Anesthesia Plan  ASA: 3  Anesthesia Plan: General   Post-op Pain Management:    Induction: Intravenous  PONV Risk Score and Plan: 3 and Ondansetron, Dexamethasone and Treatment may vary due to age or medical condition  Airway Management Planned: Oral ETT  Additional Equipment:   Intra-op Plan:   Post-operative Plan: Extubation in OR  Informed Consent: I have reviewed the patients History and Physical, chart, labs and discussed the procedure including the risks, benefits and alternatives for the proposed anesthesia with the patient or authorized representative who has indicated his/her understanding and acceptance.     Dental advisory given  Plan Discussed with: CRNA, Anesthesiologist and Surgeon  Anesthesia Plan Comments:        Anesthesia Quick Evaluation

## 2022-07-09 NOTE — Interval H&P Note (Signed)
History and Physical Interval Note:  07/09/2022 5:04 PM  Janet Mccarthy  has presented today for surgery, with the diagnosis of periprosthetic femur fracture.  The various methods of treatment have been discussed with the patient and family. After consideration of risks, benefits and other options for treatment, the patient has consented to  Procedure(s): ORIF femur fracture (Right) as a surgical intervention.  The patient's history has been reviewed, patient examined, no change in status, stable for surgery.  I have reviewed the patient's chart and labs.  Questions were answered to the patient's satisfaction.     Pilar Plate Brett Soza

## 2022-07-09 NOTE — Op Note (Unsigned)
NAME: Janet Mccarthy, Janet Mccarthy MEDICAL RECORD NO: [id number] ACCOUNT NO: [id number] DATE OF BIRTH: 05-23-1935 FACILITY: [facility] LOCATION: [facility] PHYSICIAN: Dione Plover. Khaleel Beckom, MD  Operative Report   DATE OF PROCEDURE: 07/09/2022  PREOPERATIVE DIAGNOSIS:  Right periprosthetic femur fracture.  POSTOPERATIVE DIAGNOSIS:  Right periprosthetic femur fracture.  PROCEDURE:  Open reduction with internal fixation right periprosthetic femur fracture.  SURGEON:  Dione Plover. Eustace Hur, MD  ASSISTANT:  Theresa Duty, PA-C.  ANESTHESIA:  General.  ESTIMATED BLOOD LOSS:  50 mL.  DRAINS:  None.  COMPLICATIONS:  None.  CONDITION:  Stable to recovery.  BRIEF CLINICAL NOTE:  The patient is an 87 year old female who had a right total hip arthroplasty performed approximately 20 years ago.  She had developed a femoral stress fracture about a year and a half to 2 years ago, which apparently healed.   Unfortunately, she has been having a slight increase in pain recently and then had a twisting episode yesterday in which she fractured the femur through the area of the old stress fracture.  The stem is stable.  She presents now for open reduction  internal fixation of the femur fracture.  PROCEDURE IN DETAIL:  After successful administration of general anesthetic, the patient was placed in the left lateral decubitus position with the right side up and held with a hip positioner.  Right lower extremity was isolated from her perineum with  plastic drapes and prepped and draped in the usual sterile fashion.  A long lateral incision was made starting at the top of the greater trochanter coursing distally along the lateral aspect of the femur.  Skin cut with a 10 blade through subcutaneous  tissue to the fascia lata, which was incised in line with the skin incision.  The fascia of the vastus lateralis was split and then the muscle was elevated off the posterior intermuscular septum.  Perforators were identified and  cauterized.  The fracture  site was identified.  There was some callus formation from where she tried to heal the previous stress fracture.  I used a rongeur to remove that and used that as bone graft.  I reduced the fracture and then placed two cerclage cables using the Zimmer  cable system and we sequentially tightened these effectively reducing the fracture.  Under C-arm guidance, we looked at the fracture in AP and lateral views and it was reduced anatomically.  I then placed a 9 hole Zimmer periprosthetic femur plate and  this was a locking plate.  We placed on the lateral aspect of the femur.  We pinned it in position.  I filled one hole with bicortical screw distally and then another proximally and then we checked under C-arm to show that the fracture remained reduced,  the plate was in good position.  We then filled 4 more cortical screws distally and four more screws proximally, some were bicortical, some were unicortical, they were all covered with locking caps proximally.  We took another C-arm view AP and lateral  and was very pleased with the position of the hardware and with the fixation of the fracture.  I placed the hip through range of motion and everything was moving as one stable unit.  We then copiously irrigated the wound with saline solution and closed  the fascia of the vastus lateralis with a running #1 Vicryl.  The fascia lata was closed with a running 0 Stratafix.  Subcutaneous was closed with interrupted 2-0 Vicryl and subcuticular running 4-0 Monocryl.  The incisions cleaned and dried  and  Steri-Strips and a sterile dressing applied.  She was then awakened and transported to recovery in stable condition.  Note that a surgical assistant was of medical necessity for this procedure.  Assistant was necessary to retract vital neurovascular structures and to hold femur, reduced while I placed the cables and then assisted with placing the hardware.   NIK D: 07/09/2022 7:38:31 pm T:  07/09/2022 10:13:00 pm  JOB: [id number]/ [id number]

## 2022-07-09 NOTE — H&P (View-Only) (Signed)
Mrs. Janet Mccarthy is well known to me from previous bilateral hip surgeries. I replaced her right hip approximately 20 years ago. She had a stress fracture of the right femur approximately 2 years ago and apparently healed it non-operatively. She recently began having thigh pain again and pivoted on her right leg yesterday with immediate right thigh pain and inability to bear weight. She now has a displaced fracture through the site of the previous stress fracture. The femoral stem is intact and has not migrated.   This injury will require operative fixation. Will plan ORIF of the right femur with a cable/locking plate system. We will assess stability of the stem at the time of surgery and if loose will revise the stem but I do not anticipate that this will be necessary. I discussed all of this with the patient and her husband and they elect to proceed. Plan surgery later this afternoon

## 2022-07-09 NOTE — Discharge Instructions (Signed)

## 2022-07-09 NOTE — Plan of Care (Signed)
°  Problem: Coping: °Goal: Level of anxiety will decrease °Outcome: Progressing °  °

## 2022-07-09 NOTE — Progress Notes (Signed)
PROGRESS NOTE  Janet Mccarthy WIO:[id number] DOB: [birth year] DOA: 07/08/2022 PCP: Crist Infante, MD  HPI/Recap of past 24 hours:  Janet Mccarthy is a 87 y.o. female with medical history significant for CKD stage IIIa, hypothyroidism, HLD, depression/anxiety, normocytic anemia, s/p bilateral THA who presented to the ED for evaluation of right hip pain.   Patient states that she has been dealing with chronic right hip pain.  This has been felt secondary to some loosening of the original prosthesis that was put in over 25 years ago.   Patient states while showering today she twisted her right leg and developed acutely worsening pain to the area.  She was unable to bear weight.  EMS were called and she was brought to the ED for further evaluation.  Right hip, femur, knee x-rays show acute periprosthetic fracture of the distal aspect of the right femoral stem.  [facility] consulted.  07/09/2022: The patient was seen and examined at her bedside.  Her pain was well-controlled.  Plan for orthopedic surgery this afternoon.  Assessment/Plan: Principal Problem:   Periprosthetic fracture around internal prosthetic hip joint, initial encounter Active Problems:   Chronic kidney disease, stage 3a (HCC)   Hypothyroidism   Normocytic anemia  Periprosthetic fracture around internal prosthetic hip joint, initial encounter Admitted with acute right periprosthetic femur fracture.  Seen by orthopedics, plan for repair on 2023. Pain control and bowel regimen.   Chronic kidney disease, stage 3a ([place]) Review of labs shows that patient has borderline CKD stage IIIa-IIIb with baseline EGFR 42-48.  Renal function relatively stable. Monitor renal function and urine l output.   Normocytic anemia Hemoglobin stable. Due to H&H postop   Hypothyroidism Continue Synthroid.   DVT prophylaxis: SCDs Start: 07/08/22 2003  Disposition Plan: From home, dispo pending clinical progress Consults called:  Orthopedic  Code Status: Full code.  Family Communication: Updated her husband and son at bedside.  Disposition Plan: Likely will discharge to home with home health services.   Consultants: Orthopedic surgery.  Procedures: Plan for orthopedic surgery.  Antimicrobials: None.  DVT prophylaxis: Defer to orthopedic surgery.  SCDs.  Status is: Inpatient The patient requires at least 2 midnights for further evaluation and treatment of present condition.    Objective: Vitals:   07/09/22 0906 07/09/22 1337 07/09/22 1422 07/09/22 1545  BP: (!) 158/68 (!) 183/81 (!) 153/73 (!) 183/79  Pulse: 92 98 93 (!) 107  Resp: _0 Temp: 98.5 F (36.9 C) 98.4 F (36.9 C)  98.3 F (36.8 C)  TempSrc:  Oral    SpO2: 96% 100%  96%  Weight:      Height:        Intake/Output Summary (Last 24 hours) at 07/09/2022 1810 Last data filed at 07/09/2022 1804 Gross per 24 hour  Intake 500 ml  Output --  Net 500 ml   Filed Weights   07/09/22 0424  Weight: 73.5 kg    Exam:  General: 87 y.o. year-old female well developed well nourished in no acute distress.  Alert and oriented x3. Cardiovascular: Regular rate and rhythm with no rubs or gallops.  No thyromegaly or JVD noted.   Respiratory: Clear to auscultation with no wheezes or rales. Good inspiratory effort. Abdomen: Soft nontender nondistended with normal bowel sounds x4 quadrants. Musculoskeletal: No lower extremity edema. 2/4 pulses in all 4 extremities. Skin: No ulcerative lesions noted or rashes, Psychiatry: Mood is appropriate for condition and setting   Data Reviewed: CBC: Recent Labs  Lab 07/08/22 1654 07/09/22 0330  WBC 11.5* 9.9  NEUTROABS 9.0*  --   HGB 10.6* 10.1*  HCT 35.3* 32.5*  MCV 98.3 97.9  PLT PLATELET CLUMPS NOTED ON SMEAR, COUNT APPEARS DECREASED 191   Basic Metabolic Panel: Recent Labs  Lab 07/08/22 1654 07/09/22 0330  NA 137 135  K 4.0 3.7  CL 106 104  CO2 23 22  GLUCOSE 112* 102*  BUN  26* 23  CREATININE 1.23* 1.22*  CALCIUM 10.1 9.7   GFR: Estimated Creatinine Clearance: 34.6 mL/min (A) (by C-G formula based on SCr of 1.22 mg/dL (H)). Liver Function Tests: No results for input(s): "AST", "ALT", "ALKPHOS", "BILITOT", "PROT", "ALBUMIN" in the last 168 hours. No results for input(s): "LIPASE", "AMYLASE" in the last 168 hours. No results for input(s): "AMMONIA" in the last 168 hours. Coagulation Profile: Recent Labs  Lab 07/08/22 1654  INR 1.1   Cardiac Enzymes: No results for input(s): "CKTOTAL", "CKMB", "CKMBINDEX", "TROPONINI" in the last 168 hours. BNP (last 3 results) No results for input(s): "PROBNP" in the last 8760 hours. HbA1C: No results for input(s): "HGBA1C" in the last 72 hours. CBG: No results for input(s): "GLUCAP" in the last 168 hours. Lipid Profile: No results for input(s): "CHOL", "HDL", "LDLCALC", "TRIG", "CHOLHDL", "LDLDIRECT" in the last 72 hours. Thyroid Function Tests: No results for input(s): "TSH", "T4TOTAL", "FREET4", "T3FREE", "THYROIDAB" in the last 72 hours. Anemia Panel: No results for input(s): "VITAMINB12", "FOLATE", "FERRITIN", "TIBC", "IRON", "RETICCTPCT" in the last 72 hours. Urine analysis:    Component Value Date/Time   COLORURINE YELLOW 02/07/2022 2016   APPEARANCEUR CLEAR 02/07/2022 2016   LABSPEC 1.011 02/07/2022 2016   PHURINE 6.5 02/07/2022 2016   GLUCOSEU NEGATIVE 02/07/2022 2016   HGBUR NEGATIVE 02/07/2022 2016   BILIRUBINUR NEGATIVE 02/07/2022 2016   KETONESUR NEGATIVE 02/07/2022 2016   PROTEINUR TRACE (A) 02/07/2022 2016   NITRITE NEGATIVE 02/07/2022 2016   LEUKOCYTESUR NEGATIVE 02/07/2022 2016   Sepsis Labs: _0 (procalcitonin:4,lacticidven:4)  ) Recent Results (from the past 240 hour(s))  Surgical PCR screen     Status: None   Collection Time: 07/09/22  3:13 PM   Specimen: Nasal Mucosa; Nasal Swab  Result Value Ref Range Status   MRSA, PCR NEGATIVE NEGATIVE Final   Staphylococcus aureus  NEGATIVE NEGATIVE Final    Comment: (NOTE) The Xpert SA Assay (FDA approved for NASAL specimens in patients 28 years of age and older), is one component of a comprehensive surveillance program. It is not intended to diagnose infection nor to guide or monitor treatment. Performed at [facility], [place] [address], [place], [place] [address]       Studies: Chest Portable 1 View  Result Date: 07/08/2022 CLINICAL DATA:  Preop EXAM: PORTABLE CHEST 1 VIEW COMPARISON:  02/07/2022 FINDINGS: The heart size and mediastinal contours are within normal limits. Both lungs are clear. The visualized skeletal structures are unremarkable. IMPRESSION: No active disease. Electronically Signed   By: Rolm Baptise M.D.   On: 07/08/2022 21:00    Scheduled Meds:  [MAR Hold] amitriptyline  50 mg Oral QHS   [MAR Hold] levothyroxine  75 mcg Oral Q0600   [MAR Hold] pantoprazole  40 mg Oral q morning   [MAR Hold] povidone-iodine   Topical Once    Continuous Infusions:  sodium chloride 75 mL/hr at 07/09/22 0958   lactated ringers 50 mL/hr at 07/09/22 1730     LOS: 1 day     Kayleen Memos, MD [facility] Pager [phone]  If 7PM-7AM, please contact night-coverage [email or web address] Password [email or web address] 07/09/2022, 6:10 PM

## 2022-07-09 NOTE — Brief Op Note (Signed)
07/09/2022  7:33 PM  PATIENT:  Janet Mccarthy  87 y.o. female  PRE-OPERATIVE DIAGNOSIS:  periprosthetic femur fracture  POST-OPERATIVE DIAGNOSIS:  periprosthetic femur fracture  PROCEDURE:  Procedure(s): ORIF femur fracture (Right)  SURGEON:  Surgeon(s) and Role:    Gaynelle Arabian, MD - Primary  PHYSICIAN ASSISTANT:   ASSISTANTS: Theresa Duty, PA-C   ANESTHESIA:   general  EBL:  50 mL   BLOOD ADMINISTERED:none  DRAINS: none   LOCAL MEDICATIONS USED:  NONE  COUNTS:  YES  TOURNIQUET:  * No tourniquets in log *  DICTATION: .Other Dictation: Dictation Number [id number]  PLAN OF CARE: Admit to inpatient   PATIENT DISPOSITION:  PACU - hemodynamically stable.

## 2022-07-09 NOTE — Progress Notes (Signed)
Mrs. Wessman is well known to me from previous bilateral hip surgeries. I replaced her right hip approximately 20 years ago. She had a stress fracture of the right femur approximately 2 years ago and apparently healed it non-operatively. She recently began having thigh pain again and pivoted on her right leg yesterday with immediate right thigh pain and inability to bear weight. She now has a displaced fracture through the site of the previous stress fracture. The femoral stem is intact and has not migrated.   This injury will require operative fixation. Will plan ORIF of the right femur with a cable/locking plate system. We will assess stability of the stem at the time of surgery and if loose will revise the stem but I do not anticipate that this will be necessary. I discussed all of this with the patient and her husband and they elect to proceed. Plan surgery later this afternoon

## 2022-07-09 NOTE — Transfer of Care (Signed)
Immediate Anesthesia Transfer of Care Note  Patient: Janet Mccarthy  Procedure(s) Performed: Procedure(s): ORIF femur fracture (Right)  Patient Location: PACU  Anesthesia Type:General  Level of Consciousness: Alert, Awake, Oriented  Airway & Oxygen Therapy: Patient Spontanous Breathing  Post-op Assessment: Report given to RN  Post vital signs: Reviewed and stable  Last Vitals:  Vitals:   07/09/22 1422 07/09/22 1545  BP: (!) 153/73 (!) 183/79  Pulse: 93 (!) 107  Resp:  18  Temp:  36.8 C  SpO2:  80%    Complications: No apparent anesthesia complications

## 2022-07-09 NOTE — Progress Notes (Signed)
Initial Nutrition Assessment  DOCUMENTATION CODES:  Not applicable  INTERVENTION:  Regular diet after surgery MVI with minerals daily Carnation Instant Breakfast BID- each packet provides 130kcal and 5g protein  Magic cup TID with meals, each supplement provides 290 kcal and 9 grams of protein  NUTRITION DIAGNOSIS:  Increased nutrient needs related to hip fracture as evidenced by estimated needs.  GOAL:  Patient will meet greater than or equal to 90% of their needs  MONITOR:  PO intake, Supplement acceptance, Diet advancement, Labs  REASON FOR ASSESSMENT:  Consult Hip fracture protocol  ASSESSMENT:  Pt with hx of HLD, GERD, COPD, and prior hip replacement presented to ED after twisting her right hip with subsequent pain. Imaging in ED showed acute periprosthetic fracture of the distal aspect of the right femoral stem.   Talked with pt on room phone to discuss recent nutrition intake. Pt reports that she was hospitalized over the summer and since that time appetite has been decreased from baseline. However, pt reports that she has not had any weight loss and has done well with drinking fluids.  Discussed increased needs after surgery and preserving lean muscle mass. Pt reports that she does not like ensure but that she is willing to try carnation instant breakfast and magic cup after surgery. Will add when diet is advanced.  Nutritionally Relevant Medications: Scheduled Meds:  pantoprazole  40 mg Oral q morning   PRN Meds: ondansetron, senna-docusate  Labs Reviewed: Creatinine 1.22  NUTRITION - FOCUSED PHYSICAL EXAM: Defer to in-person assessment  Diet Order:   Diet Order             Diet NPO time specified  Diet effective ____                   EDUCATION NEEDS:  No education needs have been identified at this time  Skin:  Skin Assessment: Reviewed RN Assessment  Last BM:  12/6  Height:  Ht Readings from Last 1 Encounters:  07/09/22 '5\' 6"'$  (1.676 m)     Weight:  Wt Readings from Last 1 Encounters:  07/09/22 73.5 kg    Ideal Body Weight:  59.1 kg  BMI:  Body mass index is 26.15 kg/m.  Estimated Nutritional Needs:  Kcal:  1500-1700 kcal/d Protein:  75-90 g/d Fluid:  >/=1.5L/d    Ranell Patrick, RD, LDN Clinical Dietitian RD pager # available in [facility]  After hours/weekend pager # available in [facility]

## 2022-07-09 NOTE — Anesthesia Procedure Notes (Signed)
Procedure Name: Intubation Date/Time: 07/09/2022 5:37 PM  Performed by: Jonna Munro, CRNAPre-anesthesia Checklist: Patient identified, Emergency Drugs available, Suction available, Patient being monitored and Timeout performed Patient Re-evaluated:Patient Re-evaluated prior to induction Oxygen Delivery Method: Circle system utilized Preoxygenation: Pre-oxygenation with 100% oxygen Induction Type: IV induction Ventilation: Mask ventilation without difficulty Laryngoscope Size: Mac and 3 Grade View: Grade I Tube type: Oral Tube size: 7.0 mm Number of attempts: 1 Airway Equipment and Method: Stylet Placement Confirmation: ETT inserted through vocal cords under direct vision, positive ETCO2, CO2 detector and breath sounds checked- equal and bilateral Secured at: 22 cm Tube secured with: Tape Dental Injury: Teeth and Oropharynx as per pre-operative assessment

## 2022-07-09 NOTE — Plan of Care (Signed)
  Problem: Education: Goal: Knowledge of General Education information will improve Description: Including pain rating scale, medication(s)/side effects and non-pharmacologic comfort measures Outcome: Progressing   Problem: Clinical Measurements: Goal: Diagnostic test results will improve Outcome: Progressing   Problem: Activity: Goal: Risk for activity intolerance will decrease Outcome: Progressing   

## 2022-07-10 DIAGNOSIS — Z96649 Presence of unspecified artificial hip joint: Secondary | ICD-10-CM | POA: Diagnosis not present

## 2022-07-10 DIAGNOSIS — M978XXA Periprosthetic fracture around other internal prosthetic joint, initial encounter: Secondary | ICD-10-CM | POA: Diagnosis not present

## 2022-07-10 LAB — CBC
HCT: 32.9 % — ABNORMAL LOW (ref 36.0–46.0)
Hemoglobin: 10 g/dL — ABNORMAL LOW (ref 12.0–15.0)
MCH: 29.9 pg (ref 26.0–34.0)
MCHC: 30.4 g/dL (ref 30.0–36.0)
MCV: 98.2 fL (ref 80.0–100.0)
Platelets: 265 10*3/uL (ref 150–400)
RBC: 3.35 MIL/uL — ABNORMAL LOW (ref 3.87–5.11)
RDW: 15.2 % (ref 11.5–15.5)
WBC: 10.5 10*3/uL (ref 4.0–10.5)
nRBC: 0 % (ref 0.0–0.2)

## 2022-07-10 LAB — BASIC METABOLIC PANEL
Anion gap: 8 (ref 5–15)
BUN: 24 mg/dL — ABNORMAL HIGH (ref 8–23)
CO2: 24 mmol/L (ref 22–32)
Calcium: 9.6 mg/dL (ref 8.9–10.3)
Chloride: 107 mmol/L (ref 98–111)
Creatinine, Ser: 1.34 mg/dL — ABNORMAL HIGH (ref 0.44–1.00)
GFR, Estimated: 39 mL/min — ABNORMAL LOW (ref >60)
Glucose, Bld: 124 mg/dL — ABNORMAL HIGH (ref 70–99)
Potassium: 4.4 mmol/L (ref 3.5–5.1)
Sodium: 139 mmol/L (ref 135–145)

## 2022-07-10 NOTE — Progress Notes (Signed)
   07/10/22 1500  PT Visit Information  Last PT Received On 07/10/22  Assistance Needed Exercise focused session, pt tolerated well. Politely declined OOB wanting to rest. Anticipate steady progress in acute setting  History of Present Illness 86 yo female s/p open reduction with internal fixation right periprosthetic femur fracture. PMH: R post THA, CKD, L THA AA,  R hip stress fx  Subjective Data  Patient Stated Goal less pain, hip to heal  Precautions  Precautions Fall  Restrictions  RLE Weight Bearing PWB  RLE Partial Weight Bearing Percentage or Pounds 25-50  Pain Assessment  Pain Assessment 0-10  Pain Score 2  Pain Location right hip  Pain Descriptors / Indicators Grimacing;Guarding;Sore  Pain Intervention(s) Limited activity within patient's tolerance;Monitored during session;Premedicated before session;Repositioned  Cognition  Arousal/Alertness Awake/alert  Behavior During Therapy WFL for tasks assessed/performed  Overall Cognitive Status Within Functional Limits for tasks assessed  Bed Mobility  General bed mobility comments politely declined OOB d/t fatigue. wanted to rest  Total Joint Exercises  Ankle Circles/Pumps AROM;Both;10 reps  Quad Sets AROM;Both;10 reps  Short Arc Quad AROM;Right;10 reps  Heel Slides AAROM;AROM;Right;10 reps  Hip ABduction/ADduction AROM;AAROM;Right;10 reps  PT - End of Session  Activity Tolerance Patient tolerated treatment well  Patient left in bed;with call bell/phone within reach;with bed alarm set   PT - Assessment/Plan  PT Plan Current plan remains appropriate  PT Visit Diagnosis Other abnormalities of gait and mobility (R26.89);Difficulty in walking, not elsewhere classified (R26.2)  PT Frequency (ACUTE ONLY) 7X/week  Follow Up Recommendations Follow physician's recommendations for discharge plan and follow up therapies  Assistance recommended at discharge Intermittent Supervision/Assistance  Patient can return home with the following  Assist for transportation;Help with stairs or ramp for entrance;Assistance with cooking/housework;A little help with bathing/dressing/bathroom  PT equipment None recommended by PT  AM-PAC PT "6 Clicks" Mobility Outcome Measure (Version 2)  Help needed turning from your back to your side while in a flat bed without using bedrails? 3  Help needed moving from lying on your back to sitting on the side of a flat bed without using bedrails? 3  Help needed moving to and from a bed to a chair (including a wheelchair)? 3  Help needed standing up from a chair using your arms (e.g., wheelchair or bedside chair)? 3  Help needed to walk in hospital room? 3  Help needed climbing 3-5 steps with a railing?  3  6 Click Score 18  Consider Recommendation of Discharge To: Home with [facility]  PT Goal Progression  Progress towards PT goals Progressing toward goals  Acute Rehab PT Goals  PT Goal Formulation With patient  Time For Goal Achievement 07/17/22  Potential to Achieve Goals Good  PT Time Calculation  PT Start Time (ACUTE ONLY) 1438  PT Stop Time (ACUTE ONLY) 1448  PT Time Calculation (min) (ACUTE ONLY) 10 min  PT General Charges  $$ ACUTE PT VISIT 1 Visit  PT Treatments  $Therapeutic Exercise 8-22 mins

## 2022-07-10 NOTE — Progress Notes (Signed)
PROGRESS NOTE  Janet Mccarthy:[id number] DOB: 1935/04/27 DOA: 07/08/2022 PCP: Crist Infante, MD  HPI/Recap of past 24 hours: Janet Mccarthy is a 87 y.o. female with medical history significant for CKD stage IIIa, hypothyroidism, HLD, depression/anxiety, normocytic anemia, s/p bilateral THA who presented to the ED for evaluation of worsening chronic right hip pain of weeks duration, worsened after she twisted her right leg on the day of presentation to the ED.   Right hip, femur, knee x-rays show acute periprosthetic fracture of the distal aspect of the right femoral stem.  [facility] was consulted.  Status post ORIF femur fracture on 07/09/2022 by Dr. Wynelle Link.  07/10/2022: Seen and examined at her bedside.  Reports 5/10 pain from lateral right hip, prior to receiving her analgesics.  No other complaints.  Evaluated by PT and orthopedic surgery's guidance.  Assessment/Plan: Principal Problem:   Periprosthetic fracture around internal prosthetic hip joint, initial encounter Active Problems:   Chronic kidney disease, stage 3a (HCC)   Hypothyroidism   Normocytic anemia  Periprosthetic fracture around internal prosthetic hip joint, initial encounter status post ORIF on 07/09/2022 by Dr. Wynelle Link Continue pain control and bowel regimen. Continue PT under orthopedic surgery's guidance   Chronic kidney disease, stage 3B (HCC) Avoid nephrotoxic agents, dehydration and hypotension Monitor urine output Repeat renal function in the morning   Acute blood loss anemia post orthopedic surgery  Latest hemoglobin 10.0 Continue to monitor H&H Repeat CBC in the morning   Hypothyroidism Continue Synthroid.   DVT prophylaxis: Eliquis  Disposition Plan: Possible discharge to home on 07/11/2022. Consults called: Orthopedic  Code Status: Full code.  Family Communication: Updated her son at bedside.  Disposition Plan: Likely will discharge to home on  07/11/2022.  Consultants: Orthopedic surgery.  Procedures: Plan for orthopedic surgery.  Antimicrobials: None.  DVT prophylaxis: Defer to orthopedic surgery.  SCDs.  Status is: Inpatient The patient requires at least 2 midnights for further evaluation and treatment of present condition.    Objective: Vitals:   07/10/22 0118 07/10/22 0417 07/10/22 0958 07/10/22 1336  BP: 129/69 136/68 100/68 (!) 140/59  Pulse: 98 99 100 92  Resp: '16 16 17 18  '$ Temp: 97.6 F (36.4 C) 97.7 F (36.5 C) 97.9 F (36.6 C) 98.2 F (36.8 C)  TempSrc: Oral Oral Oral Oral  SpO2: 99% 99% 94% 96%  Weight:      Height:        Intake/Output Summary (Last 24 hours) at 07/10/2022 1425 Last data filed at 07/10/2022 1478 Gross per 24 hour  Intake 2000 ml  Output 1200 ml  Net 800 ml   Filed Weights   07/09/22 0424  Weight: 73.5 kg    Exam:  General: 87 y.o. year-old female well-developed well-nourished no acute issues.  She is alert and oriented x 3.   Cardiovascular: Regular rate and rhythm no rubs or gallops. Respiratory: Clear to station no wheeze no rales. Abdomen: Soft nontender nondistended with normal bowel sounds x4 quadrants. Musculoskeletal: No lower extremity edema bilaterally.  Skin: No ulcerative lesions noted or rashes, Psychiatry: Mood is appropriate for condition and setting.   Data Reviewed: CBC: Recent Labs  Lab 07/08/22 1654 07/09/22 0330 07/10/22 0349  WBC 11.5* 9.9 10.5  NEUTROABS 9.0*  --   --   HGB 10.6* 10.1* 10.0*  HCT 35.3* 32.5* 32.9*  MCV 98.3 97.9 98.2  PLT PLATELET CLUMPS NOTED ON SMEAR, COUNT APPEARS DECREASED 261 295   Basic Metabolic Panel: Recent Labs  Lab 07/08/22  1654 07/09/22 0330 07/10/22 0349  NA 137 135 139  K 4.0 3.7 4.4  CL 106 104 107  CO2 '23 22 24  '$ GLUCOSE 112* 102* 124*  BUN 26* 23 24*  CREATININE 1.23* 1.22* 1.34*  CALCIUM 10.1 9.7 9.6   GFR: Estimated Creatinine Clearance: 31.5 mL/min (A) (by C-G formula based on SCr of  1.34 mg/dL (H)). Liver Function Tests: No results for input(s): "AST", "ALT", "ALKPHOS", "BILITOT", "PROT", "ALBUMIN" in the last 168 hours. No results for input(s): "LIPASE", "AMYLASE" in the last 168 hours. No results for input(s): "AMMONIA" in the last 168 hours. Coagulation Profile: Recent Labs  Lab 07/08/22 1654  INR 1.1   Cardiac Enzymes: No results for input(s): "CKTOTAL", "CKMB", "CKMBINDEX", "TROPONINI" in the last 168 hours. BNP (last 3 results) No results for input(s): "PROBNP" in the last 8760 hours. HbA1C: No results for input(s): "HGBA1C" in the last 72 hours. CBG: No results for input(s): "GLUCAP" in the last 168 hours. Lipid Profile: No results for input(s): "CHOL", "HDL", "LDLCALC", "TRIG", "CHOLHDL", "LDLDIRECT" in the last 72 hours. Thyroid Function Tests: No results for input(s): "TSH", "T4TOTAL", "FREET4", "T3FREE", "THYROIDAB" in the last 72 hours. Anemia Panel: No results for input(s): "VITAMINB12", "FOLATE", "FERRITIN", "TIBC", "IRON", "RETICCTPCT" in the last 72 hours. Urine analysis:    Component Value Date/Time   COLORURINE YELLOW 02/07/2022 2016   APPEARANCEUR CLEAR 02/07/2022 2016   LABSPEC 1.011 02/07/2022 2016   PHURINE 6.5 02/07/2022 2016   GLUCOSEU NEGATIVE 02/07/2022 2016   HGBUR NEGATIVE 02/07/2022 2016   BILIRUBINUR NEGATIVE 02/07/2022 2016   KETONESUR NEGATIVE 02/07/2022 2016   PROTEINUR TRACE (A) 02/07/2022 2016   NITRITE NEGATIVE 02/07/2022 2016   LEUKOCYTESUR NEGATIVE 02/07/2022 2016   Sepsis Labs: '@LABRCNTIP'$ (procalcitonin:4,lacticidven:4)  ) Recent Results (from the past 240 hour(s))  Surgical PCR screen     Status: None   Collection Time: 07/09/22  3:13 PM   Specimen: Nasal Mucosa; Nasal Swab  Result Value Ref Range Status   MRSA, PCR NEGATIVE NEGATIVE Final   Staphylococcus aureus NEGATIVE NEGATIVE Final    Comment: (NOTE) The Xpert SA Assay (FDA approved for NASAL specimens in patients 58 years of age and older), is one  component of a comprehensive surveillance program. It is not intended to diagnose infection nor to guide or monitor treatment. Performed at [facility], [place] [address] [place], [place] [address]       Studies: DG FEMUR, MIN 2 VIEWS RIGHT  Result Date: 07/09/2022 CLINICAL DATA:  Right proximal femoral fracture EXAM: RIGHT FEMUR 2 VIEWS COMPARISON:  07/08/2022 FLUOROSCOPY TIME:  Radiation Exposure Index (as provided by the fluoroscopic device): 0.87 mGy If the device does not provide the exposure index: Fluoroscopy Time:  7 seconds Number of Acquired Images:  3 FINDINGS: Right hip replacement is again identified. Multiple cerclage wires were placed as well as a lateral fixation sideplate along the proximal femur. Fracture fragments are in near anatomic alignment. IMPRESSION: ORIF of right femoral fracture. Electronically Signed   By: Inez Catalina M.D.   On: 07/09/2022 19:26   DG C-Arm 1-60 Min-No Report  Result Date: 07/09/2022 Fluoroscopy was utilized by the requesting physician.  No radiographic interpretation.    Scheduled Meds:  amitriptyline  50 mg Oral QHS   apixaban  2.5 mg Oral Q12H   docusate sodium  100 mg Oral BID   levothyroxine  75 mcg Oral Q0600   pantoprazole  40 mg Oral q morning    Continuous Infusions:  sodium chloride Stopped (07/10/22 0701)   methocarbamol (ROBAXIN) IV       LOS: 2 days     Kayleen Memos, MD [facility] Pager [phone]  If 7PM-7AM, please contact night-coverage [email or web address] Password [email or web address] 07/10/2022, 2:25 PM

## 2022-07-10 NOTE — Plan of Care (Signed)
Problem: Education: Goal: Knowledge of General Education information will improve Description: Including pain rating scale, medication(s)/side effects and non-pharmacologic comfort measures Outcome: Progressing   Problem: Clinical Measurements: Goal: Ability to maintain clinical measurements within normal limits will improve Outcome: Progressing   Problem: Pain Managment: Goal: General experience of comfort will improve Outcome: [place], RN 07/10/22 9:19 AM

## 2022-07-10 NOTE — Progress Notes (Signed)
Subjective: 1 Day Post-Op Procedure(s) (LRB): ORIF femur fracture (Right)  Patient reports pain as mild to moderate.  Denies fever, chills, N/V, CP, SOB.  Tolerating POs well.  Admits to flatus.  Son at bedside.  Objective:   VITALS:  Temp:  [97.4 F (36.3 C)-98.4 F (36.9 C)] 97.7 F (36.5 C) (12/09 0417) Pulse Rate:  [91-107] 99 (12/09 0417) Resp:  [11-21] 16 (12/09 0417) BP: (129-183)/(60-81) 136/68 (12/09 0417) SpO2:  [96 %-100 %] 99 % (12/09 0417)  General: WDWN patient in NAD. Psych:  Appropriate mood and affect. Neuro:  A&O x 3, Moving all extremities, sensation intact to light touch HEENT:  EOMs intact Chest:  Even non-labored respirations Skin:  Dressing C/D/I, no rashes or lesions Extremities: warm/dry, mild edema to R thigh, no erythema or echymosis.  No lymphadenopathy. Pulses: Popliteus 2+ MSK:  ROM: TKE, MMT: able to perform quad set, (-) Homan's    LABS Recent Labs    07/08/22 1654 07/09/22 0330 07/10/22 0349  HGB 10.6* 10.1* 10.0*  WBC 11.5* 9.9 10.5  PLT PLATELET CLUMPS NOTED ON SMEAR, COUNT APPEARS DECREASED 261 265   Recent Labs    07/09/22 0330 07/10/22 0349  NA 135 139  K 3.7 4.4  CL 104 107  CO2 22 24  BUN 23 24*  CREATININE 1.22* 1.34*  GLUCOSE 102* 124*   Recent Labs    07/08/22 1654  INR 1.1     Assessment/Plan: 1 Day Post-Op Procedure(s) (LRB): ORIF femur fracture (Right)  Patient seen in rounds for Dr. Wynelle Link PWB 25-50% R LE Up with therapy DVT ppx:  Eliquis Disp: pending.  Patient hopeful to go home.  Mechele Claude PA-C [facility] Office:  [phone]

## 2022-07-10 NOTE — Plan of Care (Signed)

## 2022-07-10 NOTE — Evaluation (Signed)
Physical Therapy Evaluation Patient Details Name: Janet Mccarthy MRN: [id number] DOB: 07/21/1935 Today's Date: 07/10/2022  History of Present Illness  87 yo female s/p open reduction with internal fixation right periprosthetic femur fracture. PMH: R post THA, CKD, L THA AA,  R hip stress fx  Clinical Impression  Pt admitted with above diagnosis.  Pt amb ~ 56' with RW and min assist to min/guard. Pt could likely d/c tomorrow from PT standpoint however pt states she wants to stay until Monday  Pt currently with functional limitations due to the deficits listed below (see PT Problem List). Pt will benefit from skilled PT to increase their independence and safety with mobility to allow discharge to the venue listed below.          Recommendations for follow up therapy are one component of a multi-disciplinary discharge planning process, led by the attending physician.  Recommendations may be updated based on patient status, additional functional criteria and insurance authorization.  Follow Up Recommendations Follow physician's recommendations for discharge plan and follow up therapies      Assistance Recommended at Discharge    Patient can return home with the following  Assist for transportation;Help with stairs or ramp for entrance;Assistance with cooking/housework;A little help with bathing/dressing/bathroom    Equipment Recommendations None recommended by PT  Recommendations for Other Services       Functional Status Assessment Patient has had a recent decline in their functional status and demonstrates the ability to make significant improvements in function in a reasonable and predictable amount of time.     Precautions / Restrictions Precautions Precautions: Fall Restrictions RLE Weight Bearing: Partial weight bearing RLE Partial Weight Bearing Percentage or Pounds: 25-50      Mobility  Bed Mobility Overal bed mobility: Needs Assistance Bed Mobility: Supine to Sit      Supine to sit: Min assist     General bed mobility comments: cues for technique, light assist to guide trunk to sitting    Transfers Overall transfer level: Needs assistance Equipment used: Rolling walker (2 wheels) Transfers: Sit to/from Stand Sit to Stand: Min assist           General transfer comment: cues for hand placement and RLE position    Ambulation/Gait Ambulation/Gait assistance: Min assist, Min guard Gait Distance (Feet): 50 Feet Assistive device: Rolling walker (2 wheels) Gait Pattern/deviations: Step-to pattern       General Gait Details: cues for sequence and RW position. pt able to adhere to [facility] with cues  Stairs            Wheelchair Mobility    Modified Rankin (Stroke Patients Only)       Balance Overall balance assessment: Needs assistance Sitting-balance support: No upper extremity supported, Feet supported Sitting balance-Leahy Scale: Good       Standing balance-Leahy Scale: Fair                               Pertinent Vitals/Pain Pain Assessment Pain Assessment: 0-10 Pain Score: 4  Pain Location: right hip Pain Descriptors / Indicators: Grimacing, Guarding, Sore Pain Intervention(s): Limited activity within patient's tolerance, Monitored during session, Premedicated before session, Repositioned    Home Living Family/patient expects to be discharged to:: Private residence Living Arrangements: Spouse/significant other Available Help at Discharge: Family Type of Home: House Home Access: Stairs to enter   [employer] of Steps: 2 - garage rail/ porch 3 steps with one  rail   Home Layout: One level;Able to live on main level with bedroom/bathroom Home Equipment: Rolling Walker (2 wheels);Rollator (4 wheels);Cane - single point      Prior Function Prior Level of Function : Independent/Modified Independent                     Hand Dominance        Extremity/Trunk Assessment   Upper  Extremity Assessment Upper Extremity Assessment: Overall WFL for tasks assessed    Lower Extremity Assessment Lower Extremity Assessment: RLE deficits/detail RLE Deficits / Details: ankle WFL, kne and hip grossly 3/5 LLE Deficits / Details: WFL       Communication   Communication: No difficulties  Cognition Arousal/Alertness: Awake/alert Behavior During Therapy: WFL for tasks assessed/performed Overall Cognitive Status: Within Functional Limits for tasks assessed                                          General Comments      Exercises     Assessment/Plan    PT Assessment Patient needs continued PT services  PT Problem List Decreased strength;Decreased activity tolerance;Decreased mobility;Decreased knowledge of precautions;Pain;Decreased knowledge of use of DME       PT Treatment Interventions DME instruction;Therapeutic exercise;Gait training;Functional mobility training;Therapeutic activities;Patient/family education;Stair training    PT Goals (Current goals can be found in the Care Plan section)  Acute Rehab PT Goals Patient Stated Goal: less pain, hip to heal PT Goal Formulation: With patient Time For Goal Achievement: 07/17/22 Potential to Achieve Goals: Good    Frequency 7X/week     Co-evaluation               AM-PAC PT "6 Clicks" Mobility  Outcome Measure Help needed turning from your back to your side while in a flat bed without using bedrails?: A Little Help needed moving from lying on your back to sitting on the side of a flat bed without using bedrails?: A Little Help needed moving to and from a bed to a chair (including a wheelchair)?: A Little Help needed standing up from a chair using your arms (e.g., wheelchair or bedside chair)?: A Little Help needed to walk in hospital room?: A Little Help needed climbing 3-5 steps with a railing? : A Little 6 Click Score: 18    End of Session Equipment Utilized During Treatment: Gait  belt Activity Tolerance: Patient tolerated treatment well Patient left: with call bell/phone within reach;in chair;with chair alarm set Nurse Communication: Mobility status PT Visit Diagnosis: Other abnormalities of gait and mobility (R26.89);Difficulty in walking, not elsewhere classified (R26.2)    Time: 7416-3845 PT Time Calculation (min) (ACUTE ONLY): 27 min   Charges:   PT Evaluation $PT Eval Low Complexity: 1 Low PT Treatments $Gait Training: 8-22 mins        Baxter Flattery, PT  Acute Rehab Dept [facility]) [phone]  [facility] Weekend Pager [facility] [place] only)  [phone]  07/10/2022   [facility] 07/10/2022, 12:39 PM

## 2022-07-11 DIAGNOSIS — Z96649 Presence of unspecified artificial hip joint: Secondary | ICD-10-CM | POA: Diagnosis not present

## 2022-07-11 DIAGNOSIS — M978XXA Periprosthetic fracture around other internal prosthetic joint, initial encounter: Secondary | ICD-10-CM | POA: Diagnosis not present

## 2022-07-11 LAB — CBC
HCT: 30.2 % — ABNORMAL LOW (ref 36.0–46.0)
Hemoglobin: 9.4 g/dL — ABNORMAL LOW (ref 12.0–15.0)
MCH: 30.3 pg (ref 26.0–34.0)
MCHC: 31.1 g/dL (ref 30.0–36.0)
MCV: 97.4 fL (ref 80.0–100.0)
Platelets: 302 10*3/uL (ref 150–400)
RBC: 3.1 MIL/uL — ABNORMAL LOW (ref 3.87–5.11)
RDW: 15.3 % (ref 11.5–15.5)
WBC: 12.1 10*3/uL — ABNORMAL HIGH (ref 4.0–10.5)
nRBC: 0 % (ref 0.0–0.2)

## 2022-07-11 LAB — BASIC METABOLIC PANEL
Anion gap: 6 (ref 5–15)
BUN: 27 mg/dL — ABNORMAL HIGH (ref 8–23)
CO2: 27 mmol/L (ref 22–32)
Calcium: 9.8 mg/dL (ref 8.9–10.3)
Chloride: 106 mmol/L (ref 98–111)
Creatinine, Ser: 1.24 mg/dL — ABNORMAL HIGH (ref 0.44–1.00)
GFR, Estimated: 43 mL/min — ABNORMAL LOW (ref >60)
Glucose, Bld: 120 mg/dL — ABNORMAL HIGH (ref 70–99)
Potassium: 4.5 mmol/L (ref 3.5–5.1)
Sodium: 139 mmol/L (ref 135–145)

## 2022-07-11 NOTE — Progress Notes (Signed)
Patient ID: AZUSENA ERLANDSON, female   DOB: [birth year], 87 y.o.   MRN: [id number] Subjective: 2 Days Post-Op Procedure(s) (LRB): ORIF femur fracture (Right)    Patient reports pain as mild.  Reports significant reduction in pain since ORIF Limited weight bearing per Aluisio's order  Objective:   VITALS:   Vitals:   07/10/22 2120 07/11/22 0433  BP: (!) 140/72 129/65  Pulse: 86 85  Resp: 18 16  Temp: 98.1 F (36.7 C) 98.2 F (36.8 C)  SpO2: 97% 97%    Neurovascular intact Incision: dressing C/D/I  LABS Recent Labs    07/09/22 0330 07/10/22 0349 07/11/22 0326  HGB 10.1* 10.0* 9.4*  HCT 32.5* 32.9* 30.2*  WBC 9.9 10.5 12.1*  PLT 261 265 302    Recent Labs    07/09/22 0330 07/10/22 0349 07/11/22 0326  NA 135 139 139  K 3.7 4.4 4.5  BUN 23 24* 27*  CREATININE 1.22* 1.34* 1.24*  GLUCOSE 102* 124* 120*    Recent Labs    07/08/22 1654  INR 1.1     Assessment/Plan: 2 Days Post-Op Procedure(s) (LRB): ORIF femur fracture (Right)   Up with therapy - 25-50% WB RLE Plan apparently is to try and go home tomorrow Will need to get checked off on stairs prior to discharge

## 2022-07-11 NOTE — TOC Progression Note (Signed)
Transition of Care [facility]) - Progression Note    Patient Details  Name: Janet Mccarthy MRN: [id number] Date of Birth: 07/25/1935  Transition of Care [facility]) CM/SW Contact  Henrietta Dine, RN Phone Number: 07/11/2022, 5:38 PM  Clinical Narrative:    POD# 2 ORIF; no PT follow up; no TOC needs; TOC will sign off; please consult if needed.     Barriers to Discharge: Continued Medical Work up  Expected Discharge Plan and Services                                                 Social Determinants of Health (SDOH) Interventions    Readmission Risk Interventions     No data to display

## 2022-07-11 NOTE — Plan of Care (Signed)
  Problem: Education: Goal: Knowledge of General Education information will improve Description: Including pain rating scale, medication(s)/side effects and non-pharmacologic comfort measures Outcome: Progressing   Problem: Activity: Goal: Risk for activity intolerance will decrease Outcome: Progressing   Problem: Nutrition: Goal: Adequate nutrition will be maintained Outcome: Progressing   Problem: Pain Managment: Goal: General experience of comfort will improve Outcome: Progressing   Problem: Nutrition: Goal: Adequate nutrition will be maintained Outcome: Progressing

## 2022-07-11 NOTE — Anesthesia Postprocedure Evaluation (Signed)
Anesthesia Post Note  Patient: Janet Mccarthy  Procedure(s) Performed: ORIF femur fracture (Right: Hip)     Patient location during evaluation: PACU Anesthesia Type: General Level of consciousness: awake and alert Pain management: pain level controlled Vital Signs Assessment: post-procedure vital signs reviewed and stable Respiratory status: spontaneous breathing, nonlabored ventilation, respiratory function stable and patient connected to nasal cannula oxygen Cardiovascular status: blood pressure returned to baseline and stable Postop Assessment: no apparent nausea or vomiting Anesthetic complications: no   No notable events documented.  Last Vitals:  Vitals:   07/10/22 2120 07/11/22 0433  BP: (!) 140/72 129/65  Pulse: 86 85  Resp: 18 16  Temp: 36.7 C 36.8 C  SpO2: 97% 97%    Last Pain:  Vitals:   07/11/22 0748  TempSrc:   PainSc: 0-No pain                 [name]

## 2022-07-11 NOTE — Progress Notes (Signed)
PROGRESS NOTE    Janet Mccarthy  ZOX:[id number] DOB: 13-Oct-1934 DOA: 07/08/2022 PCP: Crist Infante, MD   Brief Narrative:    Janet Mccarthy is a 87 y.o. female with medical history significant for CKD stage IIIa, hypothyroidism, HLD, depression/anxiety, normocytic anemia, s/p bilateral THA who presented to the ED for evaluation of worsening chronic right hip pain of weeks duration, worsened after she twisted her right leg on the day of presentation to the ED.   Right hip, femur, knee x-rays show acute periprosthetic fracture of the distal aspect of the right femoral stem.  [facility] was consulted.  Status post ORIF femur fracture on 07/09/2022 by Dr. Wynelle Link.  Assessment & Plan:   Principal Problem:   Periprosthetic fracture around internal prosthetic hip joint, initial encounter Active Problems:   Chronic kidney disease, stage 3a (HCC)   Hypothyroidism   Normocytic anemia  Assessment and Plan:   Periprosthetic fracture around internal prosthetic hip joint, initial encounter status post ORIF on 07/09/2022 by Dr. Wynelle Link Continue pain control and bowel regimen. Continue PT under orthopedic surgery's guidance Anticipate discharge in a.m.   Chronic kidney disease, stage 3B (HCC) Avoid nephrotoxic agents, dehydration and hypotension Monitor urine output Repeat renal function in the morning   Acute blood loss anemia post orthopedic surgery  Latest hemoglobin 10.0 Continue to monitor H&H Repeat CBC in the morning   Hypothyroidism Continue Synthroid.  Constipation Administer MiraLAX Administer bisacodyl suppository   DVT prophylaxis: Eliquis   Disposition Plan: Possible discharge to home on 07/11/2022. Consults called: Orthopedic   Code Status: Full code.   Family Communication: Updated her son at bedside.   Disposition Plan: Likely will discharge to home on 07/12/2022.   Consultants: Orthopedic surgery.   Procedures: ORIF 12/8    Antimicrobials: Anti-infectives (From admission, onward)    Start     Dose/Rate Route Frequency Ordered Stop   07/09/22 2300  ceFAZolin (ANCEF) IVPB 2g/100 mL premix        2 g 200 mL/hr over 30 Minutes Intravenous Every 6 hours 07/09/22 2035 07/10/22 0701   07/09/22 1200  ceFAZolin (ANCEF) IVPB 2g/100 mL premix        2 g 200 mL/hr over 30 Minutes Intravenous 30 min pre-op 07/09/22 0706 07/09/22 1750        Status is: Inpatient The patient requires at least 2 midnights for further evaluation and treatment of present condition.   Subjective: Patient seen and evaluated today with no new acute complaints or concerns. No acute concerns or events noted overnight.  She is complaining of her hemorrhoidal pain and has not had a bowel movement recently.  She is also needing to work more with physical therapy prior to discharge.  Objective: Vitals:   07/10/22 1336 07/10/22 1729 07/10/22 2120 07/11/22 0433  BP: (!) 140/59 (!) 142/70 (!) 140/72 129/65  Pulse: 92 94 86 85  Resp: '18 14 18 16  '$ Temp: 98.2 F (36.8 C) 98.2 F (36.8 C) 98.1 F (36.7 C) 98.2 F (36.8 C)  TempSrc: Oral Oral Oral Oral  SpO2: 96% 96% 97% 97%  Weight:      Height:        Intake/Output Summary (Last 24 hours) at 07/11/2022 1213 Last data filed at 07/11/2022 1000 Gross per 24 hour  Intake 1200 ml  Output 650 ml  Net 550 ml   Filed Weights   07/09/22 0424  Weight: 73.5 kg    Examination:  General exam: Appears calm and comfortable  Respiratory system:  Clear to auscultation. Respiratory effort normal. Cardiovascular system: S1 & S2 heard, RRR.  Gastrointestinal system: Abdomen is soft Central nervous system: Alert and awake Extremities: No edema Skin: No significant lesions noted Psychiatry: Flat affect.    Data Reviewed: I have personally reviewed following labs and imaging studies  CBC: Recent Labs  Lab 07/08/22 1654 07/09/22 0330 07/10/22 0349 07/11/22 0326  WBC 11.5* 9.9 10.5 12.1*   NEUTROABS 9.0*  --   --   --   HGB 10.6* 10.1* 10.0* 9.4*  HCT 35.3* 32.5* 32.9* 30.2*  MCV 98.3 97.9 98.2 97.4  PLT PLATELET CLUMPS NOTED ON SMEAR, COUNT APPEARS DECREASED 261 265 130   Basic Metabolic Panel: Recent Labs  Lab 07/08/22 1654 07/09/22 0330 07/10/22 0349 07/11/22 0326  NA 137 135 139 139  K 4.0 3.7 4.4 4.5  CL 106 104 107 106  CO2 '23 22 24 27  '$ GLUCOSE 112* 102* 124* 120*  BUN 26* 23 24* 27*  CREATININE 1.23* 1.22* 1.34* 1.24*  CALCIUM 10.1 9.7 9.6 9.8   GFR: Estimated Creatinine Clearance: 34 mL/min (A) (by C-G formula based on SCr of 1.24 mg/dL (H)). Liver Function Tests: No results for input(s): "AST", "ALT", "ALKPHOS", "BILITOT", "PROT", "ALBUMIN" in the last 168 hours. No results for input(s): "LIPASE", "AMYLASE" in the last 168 hours. No results for input(s): "AMMONIA" in the last 168 hours. Coagulation Profile: Recent Labs  Lab 07/08/22 1654  INR 1.1   Cardiac Enzymes: No results for input(s): "CKTOTAL", "CKMB", "CKMBINDEX", "TROPONINI" in the last 168 hours. BNP (last 3 results) No results for input(s): "PROBNP" in the last 8760 hours. HbA1C: No results for input(s): "HGBA1C" in the last 72 hours. CBG: No results for input(s): "GLUCAP" in the last 168 hours. Lipid Profile: No results for input(s): "CHOL", "HDL", "LDLCALC", "TRIG", "CHOLHDL", "LDLDIRECT" in the last 72 hours. Thyroid Function Tests: No results for input(s): "TSH", "T4TOTAL", "FREET4", "T3FREE", "THYROIDAB" in the last 72 hours. Anemia Panel: No results for input(s): "VITAMINB12", "FOLATE", "FERRITIN", "TIBC", "IRON", "RETICCTPCT" in the last 72 hours. Sepsis Labs: No results for input(s): "PROCALCITON", "LATICACIDVEN" in the last 168 hours.  Recent Results (from the past 240 hour(s))  Surgical PCR screen     Status: None   Collection Time: 07/09/22  3:13 PM   Specimen: Nasal Mucosa; Nasal Swab  Result Value Ref Range Status   MRSA, PCR NEGATIVE NEGATIVE Final    Staphylococcus aureus NEGATIVE NEGATIVE Final    Comment: (NOTE) The Xpert SA Assay (FDA approved for NASAL specimens in patients 6 years of age and older), is one component of a comprehensive surveillance program. It is not intended to diagnose infection nor to guide or monitor treatment. Performed at [facility], [place] [address], [place], [place] [address]          Radiology Studies: DG FEMUR, MIN 2 VIEWS RIGHT  Result Date: 07/09/2022 CLINICAL DATA:  Right proximal femoral fracture EXAM: RIGHT FEMUR 2 VIEWS COMPARISON:  07/08/2022 FLUOROSCOPY TIME:  Radiation Exposure Index (as provided by the fluoroscopic device): 0.87 mGy If the device does not provide the exposure index: Fluoroscopy Time:  7 seconds Number of Acquired Images:  3 FINDINGS: Right hip replacement is again identified. Multiple cerclage wires were placed as well as a lateral fixation sideplate along the proximal femur. Fracture fragments are in near anatomic alignment. IMPRESSION: ORIF of right femoral fracture. Electronically Signed   By: Inez Catalina M.D.   On: 07/09/2022 19:26   DG C-Arm 1-60  Min-No Report  Result Date: 07/09/2022 Fluoroscopy was utilized by the requesting physician.  No radiographic interpretation.        Scheduled Meds:  amitriptyline  50 mg Oral QHS   apixaban  2.5 mg Oral Q12H   docusate sodium  100 mg Oral BID   levothyroxine  75 mcg Oral Q0600   pantoprazole  40 mg Oral q morning   Continuous Infusions:  sodium chloride Stopped (07/10/22 0701)   methocarbamol (ROBAXIN) IV       LOS: 3 days    Time spent: 35 minutes    Wataru Mccowen Darleen Crocker, DO [facility]  If 7PM-7AM, please contact night-coverage [email or web address] 07/11/2022, 12:13 PM

## 2022-07-11 NOTE — Progress Notes (Signed)
Patient and family expressing concerns about being able to safely get patient home/in the house today. She worked well with and passed physical therapy today but family is still concerned. They said tomorrow, they might be able to have more help or possibly might have to arrange for someone. Informed physician about barrier to discharge.

## 2022-07-11 NOTE — Plan of Care (Signed)
  Problem: Education: Goal: Knowledge of General Education information will improve Description: Including pain rating scale, medication(s)/side effects and non-pharmacologic comfort measures Outcome: Progressing   Problem: Activity: Goal: Risk for activity intolerance will decrease Outcome: Progressing   Problem: Pain Managment: Goal: General experience of comfort will improve Outcome: Progressing   

## 2022-07-11 NOTE — Progress Notes (Signed)
Physical Therapy Treatment Patient Details Name: Janet Mccarthy MRN: [id number] DOB: 28-Oct-1934 Today's Date: 07/11/2022   History of Present Illness 87 yo female s/p open reduction with internal fixation right periprosthetic femur fracture. PMH: R post THA, CKD, L THA AA,  R hip stress fx    PT Comments    Pt progressing well, intermittent cues for PWB however demonstrates good adherence; reviewed importance of PWB with pt and son. Will practice stairs later today and pt should be ready to d/c later today   Recommendations for follow up therapy are one component of a multi-disciplinary discharge planning process, led by the attending physician.  Recommendations may be updated based on patient status, additional functional criteria and insurance authorization.  Follow Up Recommendations  Follow physician's recommendations for discharge plan and follow up therapies     Assistance Recommended at Discharge Intermittent Supervision/Assistance  Patient can return home with the following Assist for transportation;Help with stairs or ramp for entrance;Assistance with cooking/housework;A little help with bathing/dressing/bathroom   Equipment Recommendations  None recommended by PT    Recommendations for Other Services       Precautions / Restrictions Precautions Precautions: Fall Restrictions RLE Weight Bearing: Partial weight bearing RLE Partial Weight Bearing Percentage or Pounds: 25-50%     Mobility  Bed Mobility Overal bed mobility: Needs Assistance Bed Mobility: Supine to Sit     Supine to sit: Supervision, Min guard     General bed mobility comments: incr time, no physical assist    Transfers Overall transfer level: Needs assistance Equipment used: Rolling walker (2 wheels) Transfers: Sit to/from Stand Sit to Stand: Min guard, Supervision           General transfer comment: cues for hand placement and RLE position    Ambulation/Gait Ambulation/Gait  assistance: Min guard, Supervision Gait Distance (Feet): 60 Feet Assistive device: Rolling walker (2 wheels) Gait Pattern/deviations: Step-to pattern       General Gait Details: cues for sequence and RW position. pt able to adhere to [facility] with cues   Stairs             Wheelchair Mobility    Modified Rankin (Stroke Patients Only)       Balance     Sitting balance-Leahy Scale: Good       Standing balance-Leahy Scale: Fair                              Cognition Arousal/Alertness: Awake/alert Behavior During Therapy: WFL for tasks assessed/performed Overall Cognitive Status: Within Functional Limits for tasks assessed                                          Exercises Total Joint Exercises Ankle Circles/Pumps: AROM, Both, 10 reps    General Comments        Pertinent Vitals/Pain Pain Assessment Pain Assessment: 0-10 Pain Score: 2  Pain Location: right hip Pain Descriptors / Indicators: Grimacing, Guarding, Sore Pain Intervention(s): Limited activity within patient's tolerance, Monitored during session, Premedicated before session, Repositioned    Home Living                          Prior Function            PT Goals (current goals can now be found in the  care plan section) Acute Rehab PT Goals Patient Stated Goal: less pain, hip to heal PT Goal Formulation: With patient Time For Goal Achievement: 07/17/22 Potential to Achieve Goals: Good Progress towards PT goals: Progressing toward goals    Frequency    7X/week      PT Plan Current plan remains appropriate    Co-evaluation              AM-PAC PT "6 Clicks" Mobility   Outcome Measure  Help needed turning from your back to your side while in a flat bed without using bedrails?: A Little Help needed moving from lying on your back to sitting on the side of a flat bed without using bedrails?: A Little Help needed moving to and from a bed to a  chair (including a wheelchair)?: A Little Help needed standing up from a chair using your arms (e.g., wheelchair or bedside chair)?: A Little Help needed to walk in hospital room?: A Little Help needed climbing 3-5 steps with a railing? : A Little 6 Click Score: 18    End of Session Equipment Utilized During Treatment: Gait belt Activity Tolerance: Patient tolerated treatment well Patient left: in chair;with call bell/phone within reach;with chair alarm set;with family/visitor present Nurse Communication: Mobility status PT Visit Diagnosis: Other abnormalities of gait and mobility (R26.89);Difficulty in walking, not elsewhere classified (R26.2)     Time: 3013-1438 PT Time Calculation (min) (ACUTE ONLY): 15 min  Charges:  $Gait Training: 8-22 mins                     Baxter Flattery, PT  Acute Rehab Dept [facility]) [phone]  [facility] Weekend Pager [facility] only)  [phone]  07/11/2022    [facility] 07/11/2022, 12:56 PM

## 2022-07-11 NOTE — Progress Notes (Signed)
Physical Therapy Treatment Patient Details Name: Janet Mccarthy MRN: [id number] DOB: 09-May-1935 Today's Date: 07/11/2022   History of Present Illness 87 yo female s/p open reduction with internal fixation right periprosthetic femur fracture. PMH: R post THA, CKD, L THA AA,  R hip stress fx    PT Comments    Pt progressing well; reviewed mobility as noted below including stairs with pt, husband and son;  pt is ready to d/c from PT standpoint. She has met goals.  Pt requesting to go home, husband requesting hospital staff continue care of pt. Son reports he is available to assist today and can leave work tomorrow if needed to assist, pt then stating she would call the fire department to get her inside; reviewed with pt this is not necessary since she has family assist. RN to follow up with MD and pt/family reagarding d/c   Recommendations for follow up therapy are one component of a multi-disciplinary discharge planning process, led by the attending physician.  Recommendations may be updated based on patient status, additional functional criteria and insurance authorization.  Follow Up Recommendations  Follow physician's recommendations for discharge plan and follow up therapies     Assistance Recommended at Discharge Intermittent Supervision/Assistance  Patient can return home with the following Assist for transportation;Help with stairs or ramp for entrance;Assistance with cooking/housework;A little help with bathing/dressing/bathroom   Equipment Recommendations  None recommended by PT    Recommendations for Other Services       Precautions / Restrictions Precautions Precautions: Fall Restrictions RLE Weight Bearing: Partial weight bearing RLE Partial Weight Bearing Percentage or Pounds: 25-50%     Mobility  Bed Mobility Overal bed mobility: Needs Assistance Bed Mobility: Supine to Sit     Supine to sit: Supervision, Min guard     General bed mobility comments: in  recliner    Transfers Overall transfer level: Needs assistance Equipment used: Rolling walker (2 wheels) Transfers: Sit to/from Stand Sit to Stand: Supervision           General transfer comment: cues for hand placement and RLE position; family present and educated on providing cues    Ambulation/Gait Ambulation/Gait assistance: Supervision Gait Distance (Feet): 100 Feet Assistive device: Rolling walker (2 wheels) Gait Pattern/deviations: Step-to pattern       General Gait Details: cues for sequence and RW position. pt able to adhere to [facility] 2 with cues; no LOB   Stairs Stairs: Yes Stairs assistance: Min guard Stair Management: One rail Left, Step to pattern, Forwards, With cane Number of Stairs: 3 General stair comments: cues for sequence, good stability, no LOB. pt son and husband present for session adn bale to return demo assist   Wheelchair Mobility    Modified Rankin (Stroke Patients Only)       Balance     Sitting balance-Leahy Scale: Good       Standing balance-Leahy Scale: Fair                              Cognition Arousal/Alertness: Awake/alert Behavior During Therapy: WFL for tasks assessed/performed Overall Cognitive Status: Within Functional Limits for tasks assessed                                          Exercises Total Joint Exercises Ankle Circles/Pumps: AROM, Both, 10 reps    General  Comments        Pertinent Vitals/Pain Pain Assessment Pain Assessment: 0-10 Pain Score: 2  Pain Location: right hip Pain Descriptors / Indicators: Grimacing, Guarding, Sore Pain Intervention(s): Limited activity within patient's tolerance, Monitored during session, Premedicated before session, Repositioned    Home Living                          Prior Function            PT Goals (current goals can now be found in the care plan section) Acute Rehab PT Goals Patient Stated Goal: less pain, hip to  heal PT Goal Formulation: With patient Time For Goal Achievement: 07/17/22 Potential to Achieve Goals: Good Progress towards PT goals: Progressing toward goals    Frequency    7X/week      PT Plan Current plan remains appropriate    Co-evaluation              AM-PAC PT "6 Clicks" Mobility   Outcome Measure  Help needed turning from your back to your side while in a flat bed without using bedrails?: A Little Help needed moving from lying on your back to sitting on the side of a flat bed without using bedrails?: A Little Help needed moving to and from a bed to a chair (including a wheelchair)?: A Little Help needed standing up from a chair using your arms (e.g., wheelchair or bedside chair)?: A Little Help needed to walk in hospital room?: A Little Help needed climbing 3-5 steps with a railing? : A Little 6 Click Score: 18    End of Session Equipment Utilized During Treatment: Gait belt Activity Tolerance: Patient tolerated treatment well Patient left: in chair;with call bell/phone within reach;with chair alarm set;with family/visitor present Nurse Communication: Mobility status PT Visit Diagnosis: Other abnormalities of gait and mobility (R26.89);Difficulty in walking, not elsewhere classified (R26.2)     Time: 1587-2761 PT Time Calculation (min) (ACUTE ONLY): 28 min  Charges:  $Gait Training: 23-37 mins                     Baxter Flattery, PT  Acute Rehab Dept [facility]) [phone]  [facility] Weekend Pager [facility] only)  [phone]  07/11/2022    [facility] 07/11/2022, 2:37 PM

## 2022-07-12 LAB — BASIC METABOLIC PANEL
Anion gap: 6 (ref 5–15)
Anion gap: 7 (ref 5–15)
BUN: 26 mg/dL — ABNORMAL HIGH (ref 8–23)
BUN: 22 mg/dL (ref 8–23)
CO2: 28 mmol/L (ref 22–32)
CO2: 26 mmol/L (ref 22–32)
Calcium: 10.1 mg/dL (ref 8.9–10.3)
Calcium: 9.6 mg/dL (ref 8.9–10.3)
Chloride: 105 mmol/L (ref 98–111)
Chloride: 106 mmol/L (ref 98–111)
Creatinine, Ser: 1.27 mg/dL — ABNORMAL HIGH (ref 0.44–1.00)
Creatinine, Ser: 1.18 mg/dL — ABNORMAL HIGH (ref 0.44–1.00)
GFR, Estimated: 41 mL/min — ABNORMAL LOW (ref >60)
GFR, Estimated: 45 mL/min — ABNORMAL LOW (ref >60)
Glucose, Bld: 118 mg/dL — ABNORMAL HIGH (ref 70–99)
Glucose, Bld: 110 mg/dL — ABNORMAL HIGH (ref 70–99)
Potassium: 5.6 mmol/L — ABNORMAL HIGH (ref 3.5–5.1)
Potassium: 4.2 mmol/L (ref 3.5–5.1)
Sodium: 139 mmol/L (ref 135–145)
Sodium: 139 mmol/L (ref 135–145)

## 2022-07-12 LAB — CBC
HCT: 32.4 % — ABNORMAL LOW (ref 36.0–46.0)
Hemoglobin: 9.6 g/dL — ABNORMAL LOW (ref 12.0–15.0)
MCH: 29.2 pg (ref 26.0–34.0)
MCHC: 29.6 g/dL — ABNORMAL LOW (ref 30.0–36.0)
MCV: 98.5 fL (ref 80.0–100.0)
Platelets: 265 10*3/uL (ref 150–400)
RBC: 3.29 MIL/uL — ABNORMAL LOW (ref 3.87–5.11)
RDW: 15.4 % (ref 11.5–15.5)
WBC: 8 10*3/uL (ref 4.0–10.5)
nRBC: 0 % (ref 0.0–0.2)

## 2022-07-12 LAB — MAGNESIUM: Magnesium: 2.2 mg/dL (ref 1.7–2.4)

## 2022-07-12 MED ORDER — METHOCARBAMOL 500 MG PO TABS: 500.0000 mg | ORAL_TABLET | Freq: Four times a day (QID) | ORAL | 0 refills | Status: AC | PRN

## 2022-07-12 MED ORDER — APIXABAN 2.5 MG PO TABS: 2.5000 mg | ORAL_TABLET | Freq: Two times a day (BID) | ORAL | 0 refills | Status: AC

## 2022-07-12 MED ORDER — HYDROCODONE-ACETAMINOPHEN 5-325 MG PO TABS: 1.0000 | ORAL_TABLET | Freq: Four times a day (QID) | ORAL | 0 refills | Status: AC | PRN

## 2022-07-12 NOTE — TOC Transition Note (Signed)
Transition of Care [facility]) - CM/SW Discharge Note  Patient Details  Name: Janet Mccarthy MRN: [id number] Date of Birth: August 02, 1935  Transition of Care [facility]) CM/SW Contact:  Sherie Don, LCSW Phone Number: 07/12/2022, 11:01 AM  Clinical Narrative: [facility] consulted for HH/DME needed. HHPT was recommended. Patient agreeable to [facility] referral. CSW made [facility] referral to [facility], but did not receive a response. CSW made referral to Cindie with [facility], which was accepted. CSW updated patient's son. HH added to AVS. TOC signing off.  Final next level of care: [place] Barriers to Discharge: Barriers Resolved  Patient Goals and CMS Choice Patient states their goals for this hospitalization and ongoing recovery are:: Discharge home with [place] CMS Medicare.gov Compare Post Acute Care list provided to:: Patient Choice offered to / list presented to : Patient  Discharge Plan and Services         DME Arranged: N/A DME Agency: NA HH Arranged: PT HH Agency: [place] Date [place]: 07/12/22 Time [place]: [id number] Representative spoke with at [place]: Cindie  Social Determinants of Health ([place]) Interventions    Readmission Risk Interventions     No data to display

## 2022-07-12 NOTE — Discharge Summary (Signed)
Physician Discharge Summary  Janet Mccarthy NOB:[id number] DOB: 1934/11/21 DOA: 07/08/2022  PCP: Crist Infante, MD  Admit date: 07/08/2022 Discharge date: 07/12/2022  Admitted From: Home Discharge disposition: Home with home health PT  Recommendations at discharge:  Continue pain medicines as needed Eliquis twice daily for 3 weeks for DVT prophylaxis   Brief narrative: Janet Mccarthy is a 87 y.o. female with PMH significant for CKD 3, COPD, HLD, GERD, diverticulosis, hypothyroidism, anxiety/depression, retroperitoneal mass. 12/7, patient was brought to the ED for evaluation of worsening chronic right hip pain of weeks duration, worsened after she twisted her right leg on the day of presentation.  Right hip, femur, knee x-rays showed acute periprosthetic fracture of the distal aspect of the right femoral stem.  Admitted to [facility].  12/8, underwent ORIF by Dr. Wynelle Link.  Subjective: Patient was seen and examined this morning.  Pleasant elderly Caucasian female.  Lying on bed.  Not in distress.  Son at bedside. Chart reviewed. In the last 24 hours, afebrile, hemodynamically stable, breathing on room air Labs from this morning with potassium acutely up at 5.6, renal function is stable, WBC count improved, hemoglobin stable  Hospital course: Acute periprosthetic femur fracture S/p ORIF 12/8 by Dr. Wynelle Link Per orthopedics, 25 to 50% partial weightbearing in the right lower extremity  Continue to work with home health PT Follow-up with orthopedics in 2 weeks For pain management, currently on Norco PRN DVT prophylaxis per orthopedics with Eliquis twice daily for 3 weeks  Acute blood loss anemia Chronic anemia Hemoglobin at baseline over 10.  Slight drop in hemoglobin due to fracture and surgery, currently stable above 9.  No bleeding elsewhere. Recent Labs    02/12/22 0947 07/08/22 1654 07/09/22 0330 07/10/22 0349 07/11/22 0326 07/12/22 0325   HGB  --  10.6* 10.1* 10.0* 9.4* 9.6*  MCV  --  98.3 97.9 98.2 97.4 98.5  VITAMINB12 334  --   --   --   --   --   FOLATE 29.1  --   --   --   --   --   FERRITIN 346*  --   --   --   --   --   TIBC 191*  --   --   --   --   --   IRON 19*  --   --   --   --   --   RETICCTPCT 1.0  --   --   --   --   --    CKD 3b Creatinine stable at baseline less than 1.3 Recent Labs    01/30/22 0539 01/31/22 0536 02/07/22 1845 02/10/22 0440 07/08/22 1654 07/09/22 0330 07/10/22 0349 07/11/22 0326 07/12/22 0325 07/12/22 1050  BUN '19 17 10 11 '$ 26* 23 24* 27* 26* 22  CREATININE 1.12* 1.21* 1.18* 1.18* 1.23* 1.22* 1.34* 1.24* 1.27* 1.18*   Hyperkalemia Potassium elevated to 5.6 this morning.  Repeat potassium level was obtained which showed a level of 4.2.   Recent Labs  Lab 07/09/22 0330 07/10/22 0349 07/11/22 0326 07/12/22 0325 07/12/22 1050  K 3.7 4.4 4.5 5.6* 4.2  MG  --   --   --  2.2  --    Hypothyroidism Continue Synthroid.   Constipation Administer MiraLAX Administer bisacodyl suppository  Anxiety/depression Continue Elavil  Wounds:  - Incision (Closed) 07/09/22 Leg Right (Active)  Date First Assessed/Time First Assessed: 07/09/22 1927   Location: Leg  Location Orientation: Right  Assessments 07/09/2022  7:48 PM 07/12/2022  8:38 AM  Dressing Type Silicone dressing Silver hydrofiber  Dressing Clean, Dry, Intact Clean, Dry, Intact  Site / Wound Assessment -- Dressing in place / Unable to assess  Drainage Amount -- None  Treatment Ice applied Ice applied     No associated orders.    Discharge Exam:   Vitals:   07/11/22 0433 07/11/22 1503 07/11/22 2053 07/12/22 0539  BP: 129/65 (!) 142/78 124/60 (!) 145/73  Pulse: 85 (!) 103 94 91  Resp: '16 16 16 16  '$ Temp: 98.2 F (36.8 C) 98.3 F (36.8 C) 99.1 F (37.3 C) 98.2 F (36.8 C)  TempSrc: Oral Oral Oral Oral  SpO2: 97% 100% 97% 97%  Weight:      Height:        Body mass index is 26.15 kg/m.  General exam:  Pleasant, elderly Caucasian female.  Lying down in bed.  Skin: No rashes, lesions or ulcers. HEENT: Atraumatic, normocephalic, no obvious bleeding Lungs: Clear to auscultation bilaterally CVS: Regular rate and rhythm, no murmur GI/Abd soft, nontender, nondistended, bowel sound present CNS: Alert, awake, oriented x 3 Psychiatry: Mood appropriate Extremities: No pedal edema, no calf tenderness  Follow ups:    Follow-up Information     Gaynelle Arabian, MD. Schedule an appointment as soon as possible for a visit in 2 week(s).   Specialty: Orthopedic Surgery Contact information: [address] [place] [address] [phone]         [facility] Follow up.   Specialty: Home Health Services Why: Alvis Lemmings will provide PT in the home after discharge. Contact information: [place] [address] [phone]         Crist Infante, MD Follow up.   Specialty: Internal Medicine Contact information: [address] [place] [address] [phone]                 Discharge Instructions:   Discharge Instructions     Call MD for:  difficulty breathing, headache or visual disturbances   Complete by: As directed    Call MD for:  extreme fatigue   Complete by: As directed    Call MD for:  hives   Complete by: As directed    Call MD for:  persistant dizziness or light-headedness   Complete by: As directed    Call MD for:  persistant nausea and vomiting   Complete by: As directed    Call MD for:  severe uncontrolled pain   Complete by: As directed    Call MD for:  temperature >100.4   Complete by: As directed    Diet general   Complete by: As directed    Discharge instructions   Complete by: As directed    Recommendations at discharge:   Continue pain medicines as needed  Eliquis twice daily for 3 weeks for DVT prophylaxis  General discharge instructions: Follow with Primary MD Crist Infante, MD in 7 days   Please request your PCP  to go over your hospital tests, procedures, radiology results at the follow up. Please get your medicines reviewed and adjusted.  Your PCP may decide to repeat certain labs or tests as needed. Do not drive, operate heavy machinery, perform activities at heights, swimming or participation in water activities or provide baby sitting services if your were admitted for syncope or siezures until you have seen by Primary MD or a Neurologist and advised to do so again. [employer]  Controlled Substance Reporting System database was reviewed. Do not drive, operate heavy machinery, perform activities at heights, swim, participate in water activities or provide baby-sitting services while on medications for pain, sleep and mood until your outpatient physician has reevaluated you and advised to do so again.  You are strongly recommended to comply with the dose, frequency and duration of prescribed medications. Activity: As tolerated with Full fall precautions use walker/cane & assistance as needed Avoid using any recreational substances like cigarette, tobacco, alcohol, or non-prescribed drug. If you experience worsening of your admission symptoms, develop shortness of breath, life threatening emergency, suicidal or homicidal thoughts you must seek medical attention immediately by calling 911 or calling your MD immediately  if symptoms less severe. You must read complete instructions/literature along with all the possible adverse reactions/side effects for all the medicines you take and that have been prescribed to you. Take any new medicine only after you have completely understood and accepted all the possible adverse reactions/side effects.  Wear Seat belts while driving. You were cared for by a hospitalist during your hospital stay. If you have any questions about your discharge medications or the care you received while you were in the hospital after you are discharged, you can call  the unit and ask to speak with the hospitalist or the covering physician. Once you are discharged, your primary care physician will handle any further medical issues. Please note that NO REFILLS for any discharge medications will be authorized once you are discharged, as it is imperative that you return to your primary care physician (or establish a relationship with a primary care physician if you do not have one).   Discharge wound care:   Complete by: As directed    Increase activity slowly   Complete by: As directed        Discharge Medications:   Allergies as of 07/12/2022       Reactions   Aciphex [rabeprazole] Swelling, Other (See Comments)   Lip redness and swelling   Anoro Ellipta [umeclidinium-vilanterol] Other (See Comments)   Urinary frequency   Omeprazole Other (See Comments)   Abdominal pain   Sulfa Antibiotics Itching, Nausea And Vomiting        Medication List     TAKE these medications    acetaminophen 500 MG tablet Commonly known as: TYLENOL Take 500 mg by mouth every 6 (six) hours as needed for fever or moderate pain.   amitriptyline 50 MG tablet Commonly known as: ELAVIL Take 50 mg by mouth at bedtime.   apixaban 2.5 MG Tabs tablet Commonly known as: ELIQUIS Take 1 tablet (2.5 mg total) by mouth every 12 (twelve) hours for 19 days. Then take one 81 mg aspirin once a day for three weeks. Then discontinue aspirin.   ferrous sulfate 325 (65 FE) MG tablet Take 1 tablet (325 mg total) by mouth daily with breakfast.   HYDROcodone-acetaminophen 5-325 MG tablet Commonly known as: NORCO/VICODIN Take 1-2 tablets by mouth every 6 (six) hours as needed for severe pain.   levothyroxine 75 MCG tablet Commonly known as: SYNTHROID Take 75 mcg by mouth daily before breakfast.   methocarbamol 500 MG tablet Commonly known as: ROBAXIN Take 1 tablet (500 mg total) by mouth every 6 (six) hours as needed for muscle spasms.   pantoprazole 40 MG tablet Commonly  known as: PROTONIX Take 1 tablet (40 mg total) by mouth daily. Office visit for further refills What changed:  when to take this additional instructions  sodium chloride 0.65 % Soln nasal spray Commonly known as: OCEAN Place 1 spray into both nostrils daily as needed for congestion.               Discharge Care Instructions  (From admission, onward)           Start     Ordered   07/12/22 0000  Discharge wound care:        07/12/22 1252             The results of significant diagnostics from this hospitalization (including imaging, microbiology, ancillary and laboratory) are listed below for reference.    Procedures and Diagnostic Studies:   DG FEMUR, MIN 2 VIEWS RIGHT  Result Date: 07/09/2022 CLINICAL DATA:  Right proximal femoral fracture EXAM: RIGHT FEMUR 2 VIEWS COMPARISON:  07/08/2022 FLUOROSCOPY TIME:  Radiation Exposure Index (as provided by the fluoroscopic device): 0.87 mGy If the device does not provide the exposure index: Fluoroscopy Time:  7 seconds Number of Acquired Images:  3 FINDINGS: Right hip replacement is again identified. Multiple cerclage wires were placed as well as a lateral fixation sideplate along the proximal femur. Fracture fragments are in near anatomic alignment. IMPRESSION: ORIF of right femoral fracture. Electronically Signed   By: Inez Catalina M.D.   On: 07/09/2022 19:26   DG C-Arm 1-60 Min-No Report  Result Date: 07/09/2022 Fluoroscopy was utilized by the requesting physician.  No radiographic interpretation.   Chest Portable 1 View  Result Date: 07/08/2022 CLINICAL DATA:  Preop EXAM: PORTABLE CHEST 1 VIEW COMPARISON:  02/07/2022 FINDINGS: The heart size and mediastinal contours are within normal limits. Both lungs are clear. The visualized skeletal structures are unremarkable. IMPRESSION: No active disease. Electronically Signed   By: Rolm Baptise M.D.   On: 07/08/2022 21:00   VAS [place] LOWER EXTREMITY VENOUS (DVT) (7a-7p)  Result  Date: 07/08/2022  Lower Venous DVT Study Patient Name:  OUITA NISH  Date of Exam:   07/08/2022 Medical Rec #: [id number]            Accession #:    [id number] Date of Birth: 1935-04-21            Patient Gender: F Patient Age:   78 years Exam Location:  [facility] Procedure:      VAS [place] LOWER EXTREMITY VENOUS (DVT) Referring Phys: Jerene Pitch SMALL --------------------------------------------------------------------------------  Indications: Swelling, and Edema.  Comparison Study: no prior Performing Technologist: Archie Patten RVS  Examination Guidelines: A complete evaluation includes B-mode imaging, spectral Doppler, color Doppler, and power Doppler as needed of all accessible portions of each vessel. Bilateral testing is considered an integral part of a complete examination. Limited examinations for reoccurring indications may be performed as noted. The reflux portion of the exam is performed with the patient in reverse Trendelenburg.  +---------+---------------+---------+-----------+----------+--------------+ RIGHT    CompressibilityPhasicitySpontaneityPropertiesThrombus Aging +---------+---------------+---------+-----------+----------+--------------+ CFV      Full           Yes      Yes                                 +---------+---------------+---------+-----------+----------+--------------+ SFJ      Full                                                        +---------+---------------+---------+-----------+----------+--------------+  FV Prox  Full                                                        +---------+---------------+---------+-----------+----------+--------------+ FV Mid   Full                                                        +---------+---------------+---------+-----------+----------+--------------+ FV DistalFull                                                         +---------+---------------+---------+-----------+----------+--------------+ PFV      Full                                                        +---------+---------------+---------+-----------+----------+--------------+ POP      Full           Yes      Yes                                 +---------+---------------+---------+-----------+----------+--------------+ PTV      Full                                                        +---------+---------------+---------+-----------+----------+--------------+ PERO     Full                                                        +---------+---------------+---------+-----------+----------+--------------+   +----+---------------+---------+-----------+----------+--------------+ LEFTCompressibilityPhasicitySpontaneityPropertiesThrombus Aging +----+---------------+---------+-----------+----------+--------------+ CFV Full           Yes      Yes                                 +----+---------------+---------+-----------+----------+--------------+     Summary: RIGHT: - There is no evidence of deep vein thrombosis in the lower extremity.  - No cystic structure found in the popliteal fossa.  LEFT: - No evidence of common femoral vein obstruction.  *See table(s) above for measurements and observations. Electronically signed by Jamelle Haring on 07/08/2022 at 5:40:00 PM.    Final    DG Hip Unilat W or Wo Pelvis 2-3 Views Right  Result Date: 07/08/2022 CLINICAL DATA:  Fall.  Pain. EXAM: RIGHT KNEE - COMPLETE 4+ VIEW; DG HIP (WITH OR WITHOUT PELVIS) 2-3V RIGHT; RIGHT FEMUR 2 VIEWS COMPARISON:  CT abdomen and pelvis 01/28/2022, AP pelvis 06/01/2021 FINDINGS: Pelvis and right  hip: Status post bilateral total hip arthroplasty. There is an acute periprosthetic fracture of the distal aspect of the right femoral stem with a transverse fracture extending through the lateral cortex at approximately 2/3 of the length of the stem and an oblique  fracture extending through the medial femoral diaphyseal cortex close to the tip of the stem. The fracture lines extend through the anterior greater than posterior cortices on lateral view. No displacement. The left femoral stem tip is not imaged, however no left hip arthroplasty hardware malfunction is seen. Surgical clips overlie the mid upper pelvis. Right femur: On frontal views of the right hip no significant lateral fracture displacement is seen, however on the frontal view of the distal right femur, there is moderate lateral apex angulation of the distal femoral stem periprosthetic fracture with up to approximately 1.522.5 cm craniocaudal diastasis of the lateral fracture components. Right knee: Moderate medial and mild lateral compartment joint space narrowing. Medial greater than lateral compartment mild chondrocalcinosis. Minimal chronic enthesopathic change at the quadriceps and patellar tendon insertions on the patella. No joint effusion. No acute fracture or dislocation. Moderate to severe atherosclerotic calcifications. IMPRESSION: 1. Acute periprosthetic fracture of the distal aspect of the right femoral stem. Normal alignment is seen of the fracture on frontal lateral views of the hip, however on frontal view of the distal right femur there is moderate lateral apex angulation of the distal femoral stem periprosthetic fracture, likely introduced from leg movement and changes in positioning. 2. Moderate medial and mild lateral compartment osteoarthritis of the right knee. Electronically Signed   By: Yvonne Kendall M.D.   On: 07/08/2022 16:12   DG Femur Min 2 Views Right  Result Date: 07/08/2022 CLINICAL DATA:  Fall.  Pain. EXAM: RIGHT KNEE - COMPLETE 4+ VIEW; DG HIP (WITH OR WITHOUT PELVIS) 2-3V RIGHT; RIGHT FEMUR 2 VIEWS COMPARISON:  CT abdomen and pelvis 01/28/2022, AP pelvis 06/01/2021 FINDINGS: Pelvis and right hip: Status post bilateral total hip arthroplasty. There is an acute periprosthetic  fracture of the distal aspect of the right femoral stem with a transverse fracture extending through the lateral cortex at approximately 2/3 of the length of the stem and an oblique fracture extending through the medial femoral diaphyseal cortex close to the tip of the stem. The fracture lines extend through the anterior greater than posterior cortices on lateral view. No displacement. The left femoral stem tip is not imaged, however no left hip arthroplasty hardware malfunction is seen. Surgical clips overlie the mid upper pelvis. Right femur: On frontal views of the right hip no significant lateral fracture displacement is seen, however on the frontal view of the distal right femur, there is moderate lateral apex angulation of the distal femoral stem periprosthetic fracture with up to approximately 1.522.5 cm craniocaudal diastasis of the lateral fracture components. Right knee: Moderate medial and mild lateral compartment joint space narrowing. Medial greater than lateral compartment mild chondrocalcinosis. Minimal chronic enthesopathic change at the quadriceps and patellar tendon insertions on the patella. No joint effusion. No acute fracture or dislocation. Moderate to severe atherosclerotic calcifications. IMPRESSION: 1. Acute periprosthetic fracture of the distal aspect of the right femoral stem. Normal alignment is seen of the fracture on frontal lateral views of the hip, however on frontal view of the distal right femur there is moderate lateral apex angulation of the distal femoral stem periprosthetic fracture, likely introduced from leg movement and changes in positioning. 2. Moderate medial and mild lateral compartment osteoarthritis of the right knee. Electronically  Signed   By: Yvonne Kendall M.D.   On: 07/08/2022 16:12   DG Knee Complete 4 Views Right  Result Date: 07/08/2022 CLINICAL DATA:  Fall.  Pain. EXAM: RIGHT KNEE - COMPLETE 4+ VIEW; DG HIP (WITH OR WITHOUT PELVIS) 2-3V RIGHT; RIGHT FEMUR 2  VIEWS COMPARISON:  CT abdomen and pelvis 01/28/2022, AP pelvis 06/01/2021 FINDINGS: Pelvis and right hip: Status post bilateral total hip arthroplasty. There is an acute periprosthetic fracture of the distal aspect of the right femoral stem with a transverse fracture extending through the lateral cortex at approximately 2/3 of the length of the stem and an oblique fracture extending through the medial femoral diaphyseal cortex close to the tip of the stem. The fracture lines extend through the anterior greater than posterior cortices on lateral view. No displacement. The left femoral stem tip is not imaged, however no left hip arthroplasty hardware malfunction is seen. Surgical clips overlie the mid upper pelvis. Right femur: On frontal views of the right hip no significant lateral fracture displacement is seen, however on the frontal view of the distal right femur, there is moderate lateral apex angulation of the distal femoral stem periprosthetic fracture with up to approximately 1.522.5 cm craniocaudal diastasis of the lateral fracture components. Right knee: Moderate medial and mild lateral compartment joint space narrowing. Medial greater than lateral compartment mild chondrocalcinosis. Minimal chronic enthesopathic change at the quadriceps and patellar tendon insertions on the patella. No joint effusion. No acute fracture or dislocation. Moderate to severe atherosclerotic calcifications. IMPRESSION: 1. Acute periprosthetic fracture of the distal aspect of the right femoral stem. Normal alignment is seen of the fracture on frontal lateral views of the hip, however on frontal view of the distal right femur there is moderate lateral apex angulation of the distal femoral stem periprosthetic fracture, likely introduced from leg movement and changes in positioning. 2. Moderate medial and mild lateral compartment osteoarthritis of the right knee. Electronically Signed   By: Yvonne Kendall M.D.   On: 07/08/2022 16:12      Labs:   Basic Metabolic Panel: Recent Labs  Lab 07/09/22 0330 07/10/22 0349 07/11/22 0326 07/12/22 0325 07/12/22 1050  NA 135 139 139 139 139  K 3.7 4.4 4.5 5.6* 4.2  CL 104 107 106 105 106  CO2 '22 24 27 28 26  '$ GLUCOSE 102* 124* 120* 118* 110*  BUN 23 24* 27* 26* 22  CREATININE 1.22* 1.34* 1.24* 1.27* 1.18*  CALCIUM 9.7 9.6 9.8 10.1 9.6  MG  --   --   --  2.2  --    GFR Estimated Creatinine Clearance: 35.8 mL/min (A) (by C-G formula based on SCr of 1.18 mg/dL (H)). Liver Function Tests: No results for input(s): "AST", "ALT", "ALKPHOS", "BILITOT", "PROT", "ALBUMIN" in the last 168 hours. No results for input(s): "LIPASE", "AMYLASE" in the last 168 hours. No results for input(s): "AMMONIA" in the last 168 hours. Coagulation profile Recent Labs  Lab 07/08/22 1654  INR 1.1    CBC: Recent Labs  Lab 07/08/22 1654 07/09/22 0330 07/10/22 0349 07/11/22 0326 07/12/22 0325  WBC 11.5* 9.9 10.5 12.1* 8.0  NEUTROABS 9.0*  --   --   --   --   HGB 10.6* 10.1* 10.0* 9.4* 9.6*  HCT 35.3* 32.5* 32.9* 30.2* 32.4*  MCV 98.3 97.9 98.2 97.4 98.5  PLT PLATELET CLUMPS NOTED ON SMEAR, COUNT APPEARS DECREASED 261 265 302 265   Cardiac Enzymes: No results for input(s): "CKTOTAL", "CKMB", "CKMBINDEX", "TROPONINI" in the last  168 hours. BNP: Invalid input(s): "POCBNP" CBG: No results for input(s): "GLUCAP" in the last 168 hours. D-Dimer No results for input(s): "DDIMER" in the last 72 hours. Hgb A1c No results for input(s): "HGBA1C" in the last 72 hours. Lipid Profile No results for input(s): "CHOL", "HDL", "LDLCALC", "TRIG", "CHOLHDL", "LDLDIRECT" in the last 72 hours. Thyroid function studies No results for input(s): "TSH", "T4TOTAL", "T3FREE", "THYROIDAB" in the last 72 hours.  Invalid input(s): "FREET3" Anemia work up No results for input(s): "VITAMINB12", "FOLATE", "FERRITIN", "TIBC", "IRON", "RETICCTPCT" in the last 72 hours. Microbiology Recent Results (from the  past 240 hour(s))  Surgical PCR screen     Status: None   Collection Time: 07/09/22  3:13 PM   Specimen: Nasal Mucosa; Nasal Swab  Result Value Ref Range Status   MRSA, PCR NEGATIVE NEGATIVE Final   Staphylococcus aureus NEGATIVE NEGATIVE Final    Comment: (NOTE) The Xpert SA Assay (FDA approved for NASAL specimens in patients 69 years of age and older), is one component of a comprehensive surveillance program. It is not intended to diagnose infection nor to guide or monitor treatment. Performed at [facility] [address], [place], [place] [address]     Time coordinating discharge: 35 minutes  Signed: Marlowe Aschoff Mattye Verdone  [facility] 07/12/2022, 12:53 PM

## 2022-07-12 NOTE — Plan of Care (Signed)

## 2022-07-12 NOTE — Progress Notes (Signed)
Subjective: 3 Days Post-Op Procedure(s) (LRB): ORIF femur fracture (Right) Patient reports pain as mild.    Objective: Vital signs in last 24 hours: Temp:  [98.2 F (36.8 C)-99.1 F (37.3 C)] 98.2 F (36.8 C) (12/11 0539) Pulse Rate:  [91-103] 91 (12/11 0539) Resp:  [16] 16 (12/11 0539) BP: (124-145)/(60-78) 145/73 (12/11 0539) SpO2:  [97 %-100 %] 97 % (12/11 0539)  Intake/Output from previous day: 12/10 0701 - 12/11 0700 In: 1440 [P.O.:1440] Out: 50 [Urine:50] Intake/Output this shift: Total I/O In: 120 [P.O.:120] Out: 50 [Urine:50]  Recent Labs    07/10/22 0349 07/11/22 0326 07/12/22 0325  HGB 10.0* 9.4* 9.6*   Recent Labs    07/11/22 0326 07/12/22 0325  WBC 12.1* 8.0  RBC 3.10* 3.29*  HCT 30.2* 32.4*  PLT 302 265   Recent Labs    07/11/22 0326 07/12/22 0325  NA 139 139  K 4.5 5.6*  CL 106 105  CO2 27 28  BUN 27* 26*  CREATININE 1.24* 1.27*  GLUCOSE 120* 118*  CALCIUM 9.8 10.1   No results for input(s): "LABPT", "INR" in the last 72 hours.  Neurologically intact Neurovascular intact No cellulitis present Compartment soft   Assessment/Plan: 3 Days Post-Op Procedure(s) (LRB): ORIF femur fracture (Right) Up with therapy D/C IV fluids Discharge home with home health  Home health PT ordered 25-50% PWB RLE F/U 2 weeks in office   Bernd Crom 07/12/2022, 6:57 AM

## 2022-07-12 NOTE — Plan of Care (Signed)
  Problem: Education: Goal: Knowledge of General Education information will improve Description: Including pain rating scale, medication(s)/side effects and non-pharmacologic comfort measures 07/12/2022 1339 by Jonn Shingles, LPN Outcome: Adequate for Discharge 07/12/2022 1124 by Jonn Shingles, LPN Outcome: Progressing   Problem: Health Behavior/Discharge Planning: Goal: Ability to manage health-related needs will improve 07/12/2022 1339 by Jonn Shingles, LPN Outcome: Adequate for Discharge 07/12/2022 1124 by Jonn Shingles, LPN Outcome: Progressing   Problem: Clinical Measurements: Goal: Ability to maintain clinical measurements within normal limits will improve 07/12/2022 1339 by Jonn Shingles, LPN Outcome: Adequate for Discharge 07/12/2022 1124 by Jonn Shingles, LPN Outcome: Progressing Goal: Will remain free from infection 07/12/2022 1339 by Jonn Shingles, LPN Outcome: Adequate for Discharge 07/12/2022 1124 by Jonn Shingles, LPN Outcome: Progressing Goal: Diagnostic test results will improve 07/12/2022 1339 by Jonn Shingles, LPN Outcome: Adequate for Discharge 07/12/2022 1124 by Jonn Shingles, LPN Outcome: Progressing Goal: Respiratory complications will improve 07/12/2022 1339 by Jonn Shingles, LPN Outcome: Adequate for Discharge 07/12/2022 1124 by Jonn Shingles, LPN Outcome: Progressing Goal: Cardiovascular complication will be avoided 07/12/2022 1339 by Jonn Shingles, LPN Outcome: Adequate for Discharge 07/12/2022 1124 by Jonn Shingles, LPN Outcome: Progressing   Problem: Activity: Goal: Risk for activity intolerance will decrease 07/12/2022 1339 by Jonn Shingles, LPN Outcome: Adequate for Discharge 07/12/2022 1124 by Jonn Shingles, LPN Outcome: Progressing   Problem: Nutrition: Goal: Adequate nutrition will be maintained Outcome: Adequate for Discharge   Problem: Coping: Goal: Level of anxiety will decrease Outcome:  Adequate for Discharge   Problem: Elimination: Goal: Will not experience complications related to bowel motility Outcome: Adequate for Discharge Goal: Will not experience complications related to urinary retention Outcome: Adequate for Discharge   Problem: Pain Managment: Goal: General experience of comfort will improve Outcome: Adequate for Discharge   Problem: Safety: Goal: Ability to remain free from injury will improve Outcome: Adequate for Discharge   Problem: Skin Integrity: Goal: Risk for impaired skin integrity will decrease Outcome: Adequate for Discharge   Problem: Nutrition: Goal: Adequate nutrition will be maintained Outcome: Adequate for Discharge   Problem: Pain Managment: Goal: General experience of comfort will improve Outcome: Adequate for Discharge   Problem: Education: Goal: Knowledge of the prescribed therapeutic regimen will improve Outcome: Adequate for Discharge Goal: Understanding of discharge needs will improve Outcome: Adequate for Discharge Goal: Individualized Educational Video(s) Outcome: Adequate for Discharge   Problem: Activity: Goal: Ability to avoid complications of mobility impairment will improve Outcome: Adequate for Discharge Goal: Ability to tolerate increased activity will improve Outcome: Adequate for Discharge   Problem: Clinical Measurements: Goal: Postoperative complications will be avoided or minimized Outcome: Adequate for Discharge   Problem: Pain Management: Goal: Pain level will decrease with appropriate interventions Outcome: Adequate for Discharge   Problem: Skin Integrity: Goal: Will show signs of wound healing Outcome: Adequate for Discharge

## 2022-07-12 NOTE — Progress Notes (Signed)
Physical Therapy Treatment Patient Details Name: Janet Mccarthy MRN: [id number] DOB: 11/09/1934 Today's Date: 07/12/2022   History of Present Illness 87 yo female s/p open reduction with internal fixation right periprosthetic femur fracture. PMH: R post THA, CKD, L THA AA,  R hip stress fx    PT Comments    Goals remain met, reviewed stairs again with pt/family. See below.    Recommendations for follow up therapy are one component of a multi-disciplinary discharge planning process, led by the attending physician.  Recommendations may be updated based on patient status, additional functional criteria and insurance authorization.  Follow Up Recommendations  Follow physician's recommendations for discharge plan and follow up therapies     Assistance Recommended at Discharge Intermittent Supervision/Assistance  Patient can return home with the following Assist for transportation;Help with stairs or ramp for entrance;Assistance with cooking/housework;A little help with bathing/dressing/bathroom   Equipment Recommendations  None recommended by PT    Recommendations for Other Services       Precautions / Restrictions Precautions Precautions: Fall Restrictions Weight Bearing Restrictions: No RLE Weight Bearing: Partial weight bearing RLE Partial Weight Bearing Percentage or Pounds: 25-50%     Mobility  Bed Mobility   Bed Mobility: Supine to Sit, Sit to Supine     Supine to sit: Supervision Sit to supine: Supervision   General bed mobility comments: incr time, cues to self assist with gait belt    Transfers Overall transfer level: Needs assistance Equipment used: Rolling walker (2 wheels) Transfers: Sit to/from Stand Sit to Stand: Supervision           General transfer comment: cues for hand placement and RLE position; family present and educated on providing cues    Ambulation/Gait Ambulation/Gait assistance: Supervision Gait Distance (Feet): 45  Feet Assistive device: Rolling walker (2 wheels) Gait Pattern/deviations: Step-to pattern       General Gait Details: cues for sequence and RW position. pt able to adhere to [facility] with cues; no LOB   Stairs Stairs: Yes Stairs assistance: Min guard Stair Management: One rail Left, Step to pattern, Forwards, With cane [facility]) Number of Stairs:  (up 2 down 3) General stair comments: cues for sequence, good stability, no LOB. pt son present, provide cues and  assist   Wheelchair Mobility    Modified Rankin (Stroke Patients Only)       Balance                                            Cognition Arousal/Alertness: Awake/alert Behavior During Therapy: WFL for tasks assessed/performed Overall Cognitive Status: Within Functional Limits for tasks assessed                                          Exercises      General Comments        Pertinent Vitals/Pain Pain Assessment Pain Assessment: Faces Faces Pain Scale: Hurts a little bit Pain Location: right hip Pain Descriptors / Indicators: Sore Pain Intervention(s): Limited activity within patient's tolerance, Monitored during session    Home Living                          Prior Function  PT Goals (current goals can now be found in the care plan section) Acute Rehab PT Goals Patient Stated Goal: less pain, hip to heal PT Goal Formulation: With patient Time For Goal Achievement: 07/17/22 Potential to Achieve Goals: Good Progress towards PT goals: Progressing toward goals    Frequency    7X/week      PT Plan Current plan remains appropriate    Co-evaluation              AM-PAC PT "6 Clicks" Mobility   Outcome Measure  Help needed turning from your back to your side while in a flat bed without using bedrails?: A Little Help needed moving from lying on your back to sitting on the side of a flat bed without using bedrails?: None Help needed  moving to and from a bed to a chair (including a wheelchair)?: A Little Help needed standing up from a chair using your arms (e.g., wheelchair or bedside chair)?: A Little Help needed to walk in hospital room?: A Little Help needed climbing 3-5 steps with a railing? : A Little 6 Click Score: 19    End of Session Equipment Utilized During Treatment: Gait belt Activity Tolerance: Patient tolerated treatment well Patient left: in chair;with call bell/phone within reach;with chair alarm set;with family/visitor present Nurse Communication: Mobility status PT Visit Diagnosis: Other abnormalities of gait and mobility (R26.89);Difficulty in walking, not elsewhere classified (R26.2)     Time: 5974-7185 PT Time Calculation (min) (ACUTE ONLY): 22 min  Charges:  $Gait Training: 8-22 mins                     Baxter Flattery, PT  Acute Rehab Dept [facility]) [phone]  [facility] Weekend Pager [facility] only)  [phone]  07/12/2022    [facility] 07/12/2022, 12:41 PM

## 2022-07-14 ENCOUNTER — Encounter (HOSPITAL_COMMUNITY): Payer: Self-pay | Admitting: Orthopedic Surgery

## 2022-12-14 ENCOUNTER — Other Ambulatory Visit: Payer: Self-pay | Admitting: Physician Assistant

## 2022-12-14 DIAGNOSIS — K219 Gastro-esophageal reflux disease without esophagitis: Secondary | ICD-10-CM
